# Patient Record
Sex: Female | Born: 1940 | Race: White | Hispanic: No | Marital: Single | Smoking: Former smoker
Health system: Southern US, Community
[De-identification: ages and names within clinical notes are randomized; demographics above are authoritative.]

## PROBLEM LIST (undated history)

## (undated) DIAGNOSIS — K219 Gastro-esophageal reflux disease without esophagitis: Secondary | ICD-10-CM

## (undated) DIAGNOSIS — F32A Depression, unspecified: Secondary | ICD-10-CM

## (undated) DIAGNOSIS — H409 Unspecified glaucoma: Secondary | ICD-10-CM

## (undated) DIAGNOSIS — J4489 Other specified chronic obstructive pulmonary disease: Secondary | ICD-10-CM

## (undated) DIAGNOSIS — R0600 Dyspnea, unspecified: Secondary | ICD-10-CM

## (undated) DIAGNOSIS — J45909 Unspecified asthma, uncomplicated: Secondary | ICD-10-CM

## (undated) DIAGNOSIS — E785 Hyperlipidemia, unspecified: Secondary | ICD-10-CM

## (undated) DIAGNOSIS — J189 Pneumonia, unspecified organism: Secondary | ICD-10-CM

## (undated) DIAGNOSIS — J449 Chronic obstructive pulmonary disease, unspecified: Secondary | ICD-10-CM

## (undated) DIAGNOSIS — R519 Headache, unspecified: Secondary | ICD-10-CM

## (undated) DIAGNOSIS — M1990 Unspecified osteoarthritis, unspecified site: Secondary | ICD-10-CM

## (undated) DIAGNOSIS — T7840XA Allergy, unspecified, initial encounter: Secondary | ICD-10-CM

## (undated) DIAGNOSIS — M8580 Other specified disorders of bone density and structure, unspecified site: Secondary | ICD-10-CM

## (undated) DIAGNOSIS — Z9109 Other allergy status, other than to drugs and biological substances: Secondary | ICD-10-CM

## (undated) HISTORY — PX: VAGINAL HYSTERECTOMY: SUR661

## (undated) HISTORY — PX: AMPUTATION TOE: SHX6595

## (undated) HISTORY — DX: Allergy, unspecified, initial encounter: T78.40XA

## (undated) HISTORY — PX: TONSILLECTOMY: SUR1361

## (undated) HISTORY — PX: ROTATOR CUFF REPAIR: SHX139

## (undated) HISTORY — DX: Other allergy status, other than to drugs and biological substances: Z91.09

## (undated) HISTORY — PX: APPENDECTOMY: SHX54

## (undated) HISTORY — PX: HERNIA REPAIR: SHX51

---

## 2007-07-09 ENCOUNTER — Ambulatory Visit: Payer: Self-pay

## 2007-07-30 ENCOUNTER — Other Ambulatory Visit: Payer: Self-pay

## 2007-07-30 ENCOUNTER — Ambulatory Visit: Payer: Self-pay | Admitting: Orthopaedic Surgery

## 2007-08-12 ENCOUNTER — Ambulatory Visit: Payer: Self-pay | Admitting: Orthopaedic Surgery

## 2010-11-08 ENCOUNTER — Ambulatory Visit: Payer: Self-pay | Admitting: Family Medicine

## 2011-03-28 ENCOUNTER — Ambulatory Visit: Payer: Self-pay | Admitting: Otolaryngology

## 2012-04-14 ENCOUNTER — Ambulatory Visit: Payer: Self-pay | Admitting: Family Medicine

## 2012-10-21 ENCOUNTER — Ambulatory Visit: Payer: Self-pay | Admitting: Nurse Practitioner

## 2013-03-18 DIAGNOSIS — M810 Age-related osteoporosis without current pathological fracture: Secondary | ICD-10-CM | POA: Insufficient documentation

## 2013-03-18 DIAGNOSIS — E785 Hyperlipidemia, unspecified: Secondary | ICD-10-CM | POA: Insufficient documentation

## 2013-12-19 ENCOUNTER — Inpatient Hospital Stay: Payer: Self-pay | Admitting: Internal Medicine

## 2013-12-19 LAB — BASIC METABOLIC PANEL
Anion Gap: 8 (ref 7–16)
BUN: 11 mg/dL (ref 7–18)
CO2: 24 mmol/L (ref 21–32)
Calcium, Total: 8.6 mg/dL (ref 8.5–10.1)
Chloride: 107 mmol/L (ref 98–107)
Creatinine: 0.56 mg/dL — ABNORMAL LOW (ref 0.60–1.30)
EGFR (African American): >60
Glucose: 146 mg/dL — ABNORMAL HIGH (ref 65–99)
OSMOLALITY: 280 (ref 275–301)
Potassium: 3.5 mmol/L (ref 3.5–5.1)
SODIUM: 139 mmol/L (ref 136–145)

## 2013-12-19 LAB — CBC
HCT: 40.4 % (ref 35.0–47.0)
HGB: 13.8 g/dL (ref 12.0–16.0)
MCH: 32.3 pg (ref 26.0–34.0)
MCHC: 34.2 g/dL (ref 32.0–36.0)
MCV: 94 fL (ref 80–100)
Platelet: 174 10*3/uL (ref 150–440)
RBC: 4.29 10*6/uL (ref 3.80–5.20)
RDW: 12.3 % (ref 11.5–14.5)
WBC: 5 10*3/uL (ref 3.6–11.0)

## 2013-12-19 LAB — TROPONIN I: Troponin-I: <0.02 ng/mL

## 2013-12-24 LAB — CULTURE, BLOOD (SINGLE)

## 2014-01-28 ENCOUNTER — Ambulatory Visit: Payer: Self-pay | Admitting: Adult Health

## 2014-01-31 ENCOUNTER — Emergency Department: Payer: Self-pay | Admitting: Internal Medicine

## 2014-01-31 LAB — COMPREHENSIVE METABOLIC PANEL
AST: 36 U/L (ref 15–37)
Albumin: 3.4 g/dL (ref 3.4–5.0)
Alkaline Phosphatase: 79 U/L
Anion Gap: 12 (ref 7–16)
BUN: 13 mg/dL (ref 7–18)
Bilirubin,Total: 0.4 mg/dL (ref 0.2–1.0)
CALCIUM: 8.1 mg/dL — AB (ref 8.5–10.1)
Chloride: 105 mmol/L (ref 98–107)
Co2: 23 mmol/L (ref 21–32)
Creatinine: 0.88 mg/dL (ref 0.60–1.30)
EGFR (African American): >60
Glucose: 149 mg/dL — ABNORMAL HIGH (ref 65–99)
Osmolality: 282 (ref 275–301)
POTASSIUM: 3.6 mmol/L (ref 3.5–5.1)
SGPT (ALT): 33 U/L
Sodium: 140 mmol/L (ref 136–145)
Total Protein: 7.1 g/dL (ref 6.4–8.2)

## 2014-01-31 LAB — PRO B NATRIURETIC PEPTIDE: B-Type Natriuretic Peptide: 113 pg/mL (ref 0–125)

## 2014-01-31 LAB — CBC
HCT: 41.4 % (ref 35.0–47.0)
HGB: 13.6 g/dL (ref 12.0–16.0)
MCH: 31.7 pg (ref 26.0–34.0)
MCHC: 32.8 g/dL (ref 32.0–36.0)
MCV: 97 fL (ref 80–100)
Platelet: 178 10*3/uL (ref 150–440)
RBC: 4.28 10*6/uL (ref 3.80–5.20)
RDW: 12.3 % (ref 11.5–14.5)
WBC: 4.4 10*3/uL (ref 3.6–11.0)

## 2014-01-31 LAB — TROPONIN I

## 2014-02-23 ENCOUNTER — Institutional Professional Consult (permissible substitution): Payer: Self-pay | Admitting: Internal Medicine

## 2014-02-24 ENCOUNTER — Ambulatory Visit (INDEPENDENT_AMBULATORY_CARE_PROVIDER_SITE_OTHER): Payer: BC Managed Care – PPO | Admitting: Internal Medicine

## 2014-02-24 ENCOUNTER — Encounter: Payer: Self-pay | Admitting: Internal Medicine

## 2014-02-24 ENCOUNTER — Institutional Professional Consult (permissible substitution): Payer: Self-pay | Admitting: Internal Medicine

## 2014-02-24 VITALS — BP 122/66 | HR 85 | Ht 63.0 in | Wt 140.0 lb

## 2014-02-24 DIAGNOSIS — IMO0001 Reserved for inherently not codable concepts without codable children: Secondary | ICD-10-CM | POA: Insufficient documentation

## 2014-02-24 DIAGNOSIS — R05 Cough: Secondary | ICD-10-CM

## 2014-02-24 DIAGNOSIS — J449 Chronic obstructive pulmonary disease, unspecified: Secondary | ICD-10-CM

## 2014-02-24 DIAGNOSIS — R0989 Other specified symptoms and signs involving the circulatory and respiratory systems: Secondary | ICD-10-CM

## 2014-02-24 DIAGNOSIS — R059 Cough, unspecified: Secondary | ICD-10-CM | POA: Insufficient documentation

## 2014-02-24 DIAGNOSIS — R0602 Shortness of breath: Secondary | ICD-10-CM

## 2014-02-24 DIAGNOSIS — R0609 Other forms of dyspnea: Secondary | ICD-10-CM | POA: Insufficient documentation

## 2014-02-24 MED ORDER — TIOTROPIUM BROMIDE MONOHYDRATE 2.5 MCG/ACT IN AERS: 2.0000 | INHALATION_SPRAY | Freq: Every day | RESPIRATORY_TRACT | Status: DC

## 2014-02-24 MED ORDER — FLUTICASONE-SALMETEROL 500-50 MCG/DOSE IN AEPB: 1.0000 | INHALATION_SPRAY | Freq: Two times a day (BID) | RESPIRATORY_TRACT | Status: DC

## 2014-02-24 NOTE — Patient Instructions (Addendum)
Stop Atrovent, replace with Spiriva Respimat, 2 puffs once daily. Begin Advair 500/50, 1 puff twice daily.  We will schedule a pulmonary function test and a 6 minute walk test.  We will see you back in 1 month.

## 2014-02-24 NOTE — Assessment & Plan Note (Signed)
Multifactorial: COPD, post infectious and deconditioning, Chronic sinusitis - Plan as stated for dyspnea - Continue followup with ENT, continue with nasal steroid, she is considering RAST testing and will discuss with her ENT physician

## 2014-02-24 NOTE — Assessment & Plan Note (Signed)
Multifactorial: Deconditioning, COPD, recent infection, environmental allergies. Plan as stated for shortness of breath. Patient started on Advair, Spiriva, when necessary albuterol. She was educated on these medications and proper usage and indications.

## 2014-02-24 NOTE — Assessment & Plan Note (Signed)
Multifactorial: COPD, Hx of tobacco abuse, deconditioning - PFTs, 6 minute walk test  -Patient most likely has COPD, review of records did not reveal any recent PFT, COPD is most likely a bronchitis-type given her clinical symptoms (morning cough, sputum production, and shortness of breath)  -Review her last chest x-ray in 2015 showed hyperinflated lungs with flattening of the diaphragms findings consistent with COPD no other acute findings - her CAT score is 22, and MMRC is 3.  Plan to start advair\spiriva PRN albuterol, stop atrovent.

## 2014-02-24 NOTE — Progress Notes (Signed)
Date: 02/24/2014  MRN# [id number] Sandy Eaton Mar 07, 1941   Sandy Eaton is a 73 y.o. old female seen in consultation for cough an DOE  CC:  Chief Complaint  Patient presents with  . Advice Only    Referred by Dr. Willeen Cass at [facility]NT for pulm consult.  Pt c/o sometimes prod cough with clear to white mucus, hoarseness, SOB with exertion.  S//S present X2 year.     HPI:  73 year old female past medical history significant for sinusitis, environmental allergies, seen in consultation for worsening cough x2 years and was to shortness of breath for the last 2-3 months. History per the patient and limited records. She was referred by Dr. Willeen Cass from [facility] for ongoing cough and shortness of breath. Per the patient in the left premature is seen in the ED twice for coughing and shortness of breath. The first time was in July of 2015 where she had shortness of breath fever, she was treated with IV antibiotics steroids and inhalers, had a hospital stay of 4 days. In August 2015 she represented to the ED with complaint of dyspnea at that time it was noted that her sats were 85-92% she was treated with steroids and inhalers and Tessalon Perles, she was then discharged from the ED and advised to followup with ENT. He saw Dr. Willeen Cass on 02/05/2014 who optimizing acid reflux therapy, suggested that she may have sinusitis secondary to environmental allergens, he recommended RAST testing and he started her on Astelin, he also did examination of her larynx and nasal passages which were noted to be congested with clear fluid. Today the patient tells me that she had a prior smoking history of one pack per day x25 years quit in 2010, denies any illicit drug use, has pets at home (1 dog, 1 cat, both are usually outside). She states that she has a cough throughout the day most notably in the mornings, his usually productive of milky sputum. Shortness of breath is worse over the last 2 months, it is usually with  exertion, she is able to go to the bathroom in the kitchen, but she cannot climb a flight of stairs without getting severely short of breath. She has been seeing a different urgent care centers multiple times this year for shortness of breath and had been told that she may have COPD. 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 PMHX:   Past Medical History  Diagnosis Date  . Environmental allergies    Surgical Hx:  Past Surgical History  Procedure Laterality Date  . Vaginal hysterectomy      1960's  . Rotator cuff repair Right    Family Hx:  Family History  Problem Relation Age of Onset  . COPD Father   . Asthma Daughter   . Asthma Grandchild   . Cancer Daughter     breast   Social Hx:   History  Substance Use Topics  . Smoking status: Former Smoker -- 1.00 packs/day for 30 years    Types: Cigarettes    Quit date: 02/24/2009  . Smokeless tobacco: Never Used  . Alcohol Use: No   Medication:   Current Outpatient Rx  Name  Route  Sig  Dispense  Refill  . albuterol (PROVENTIL HFA;VENTOLIN HFA) 108 (90 BASE) MCG/ACT inhaler   Inhalation   Inhale 2 puffs into the lungs every 6 (six) hours as needed for wheezing or shortness of  breath.         Marland Kitchen aspirin 81 MG tablet   Oral   Take 81 mg by mouth daily.         Marland Kitchen azelastine (ASTELIN) 0.1 % nasal spray   Each Nare   Place 1 spray into both nostrils daily. Use in each nostril as directed         . ipratropium (ATROVENT HFA) 17 MCG/ACT inhaler   Inhalation   Inhale 2 puffs into the lungs 2 (two) times daily as needed for wheezing.         . montelukast (SINGULAIR) 10 MG tablet   Oral   Take 10 mg by mouth at bedtime.         Marland Kitchen omeprazole (PRILOSEC) 20 MG capsule   Oral   Take 40 mg by mouth daily.             Allergies:  Valium  Review of Systems: Gen:  Denies  fever, sweats, chills HEENT: Denies blurred vision, double vision, ear pain, eye  pain, hearing loss, nose bleeds, sore throat, hoarseness Cvc:  No dizziness, chest pain or heaviness Resp:   Denies cough or sputum porduction, shortness of breath Gi: Denies swallowing difficulty, stomach pain, nausea or vomiting, diarrhea, constipation, bowel incontinence Gu:  Denies bladder incontinence, burning urine Ext:   No Joint pain, stiffness or swelling Skin: No skin rash, easy bruising or bleeding or hives Endoc:  No polyuria, polydipsia , polyphagia or weight change Psych: No depression, insomnia or hallucinations  Other:  All other systems negative  Physical Examination:   VS: BP 122/66  Pulse 85  Ht  (1.6 m)  Wt 140 lb (63.504 kg)  BMI 24.81 kg/m2  SpO2 96%  General Appearance: No distress  Neuro:without focal findings, mental status, speech normal, alert and oriented, cranial nerves 2-12 intact, reflexes normal and symmetric, sensation grossly normal  HEENT: PERRLA, EOM intact, no ptosis, no other lesions noticed; Mallampati 2 Pulmonary: Good respiratory effort both inspiration and expiration, no wheezes, decreased breath sounds at the bilateral bases. Sputum production: None CardiovascularNormal S1,S2.  No m/r/g.  Abdominal aorta pulsation normal.    Abdomen: Benign, Soft, non-tender, No masses, hepatosplenomegaly, No lymphadenopathy Renal:  No costovertebral tenderness  GU:  No performed at this time. Endoc: No evident thyromegaly, no signs of acromegaly or Cushing features Skin:   warm, no rashes, no ecchymosis  Extremities: normal, no cyanosis, clubbing, no edema, warm with normal capillary refill. Other findings: None   Rad results:  01/31/2014 CXR FINDINGS: Heart, mediastinum hila are unremarkable.  Lungs are mildly hyperexpanded but clear. No pleural effusion or pneumothorax.  Bony thorax is demineralized but grossly intact.  IMPRESSION: No acute cardiopulmonary disease. Other:   Assessment and Plan: SOB (shortness of breath) Multifactorial:  COPD, Hx of tobacco abuse, deconditioning - PFTs, 6 minute walk test  -Patient most likely has COPD, review of records did not reveal any recent PFT, COPD is most likely a bronchitis-type given her clinical symptoms (morning cough, sputum production, and shortness of breath)  -Review her last chest x-ray in 2015 showed hyperinflated lungs with flattening of the diaphragms findings consistent with COPD no other acute findings - her CAT score is 22, and MMRC is 3.  Plan to start advair\spiriva PRN albuterol, stop atrovent.    Cough Multifactorial: COPD, post infectious and deconditioning, Chronic sinusitis - Plan as stated for dyspnea - Continue followup with ENT, continue with nasal steroid, she is considering RAST testing  and will discuss with her ENT physician   DOE (dyspnea on exertion) Multifactorial: Deconditioning, COPD, recent infection, environmental allergies. Plan as stated for shortness of breath. Patient started on Advair, Spiriva, when necessary albuterol. She was educated on these medications and proper usage and indications.  COPD bronchitis Patient currently has clinical criteria for COPD (prolonged smoking history, chronic cough, productive sputum production, shortness of breath)  At this time unable to stage her level of airflow limitation per GOLD guidelines (stage I-4), will further evaluate with spirometry via PFTs.  At this time will Spiriva\adviar (500\50). Given her clinical symptoms and history base of the new GOLD guidelines for COPD she is probably either a category B or D. Patient educated on the proper use of albuterol, Advair, Spiriva, and its indications  Inquired about Pneumovax and flu vaccine, patient stated she would get it done at [facility].   Discuss lung cancer screening at one month followup Patient does not have a regular PMD, she is currently followed a different urgent care centers, will make referral to see either Dr. Dan Humphreys or Dr. Darrick Huntsman.  Updated  Medication List Outpatient Encounter Prescriptions as of 02/24/2014  Medication Sig  . albuterol (PROVENTIL HFA;VENTOLIN HFA) 108 (90 BASE) MCG/ACT inhaler Inhale 2 puffs into the lungs every 6 (six) hours as needed for wheezing or shortness of breath.  Marland Kitchen aspirin 81 MG tablet Take 81 mg by mouth daily.  Marland Kitchen azelastine (ASTELIN) 0.1 % nasal spray Place 1 spray into both nostrils daily. Use in each nostril as directed  . Fluticasone-Salmeterol (ADVAIR DISKUS) 500-50 MCG/DOSE AEPB Inhale 1 puff into the lungs 2 (two) times daily.  Marland Kitchen ipratropium (ATROVENT HFA) 17 MCG/ACT inhaler Inhale 2 puffs into the lungs 2 (two) times daily as needed for wheezing.  . montelukast (SINGULAIR) 10 MG tablet Take 10 mg by mouth at bedtime.  Marland Kitchen omeprazole (PRILOSEC) 20 MG capsule Take 40 mg by mouth daily.  . Tiotropium Bromide Monohydrate (SPIRIVA RESPIMAT) 2.5 MCG/ACT AERS Inhale 2 puffs into the lungs daily.    Orders for this visit: Orders Placed This Encounter  Procedures  . Ambulatory referral to [facility]    Referral Priority:  Routine    Referral Type:  Consultation    Referral Reason:  Specialty Services Required    Requested Specialty:  Family Medicine    Number of Visits Requested:  1  . Pulmonary function test    Standing Status: Future     Number of Occurrences:      Standing Expiration Date: 02/25/2015    Order Specific Question:  Where should this test be performed?    Answer:  Other    Order Specific Question:  Full PFT: includes the following: basic spirometry, spirometry pre & post bronchodilator, diffusion capacity (DLCO), lung volumes    Answer:  Full PFT    Order Specific Question:  MIP/MEP    Answer:  No    Order Specific Question:  6 minute walk    Answer:  Yes    Order Specific Question:  ABG    Answer:  No    Order Specific Question:  Diffusion capacity (DLCO)    Answer:  No    Order Specific Question:  Lung volumes    Answer:  No    Order Specific Question:  Methacholine  challenge    Answer:  No     Thank  you for the consultation and for allowing [facility] and to assist in the care of your  patient. Our recommendations are noted above.  Please contact us if we can be of further service.   Stephanie Acre, MD [facility] Office Number: [phone]

## 2014-02-24 NOTE — Assessment & Plan Note (Addendum)
Patient currently has clinical criteria for COPD (prolonged smoking history, chronic cough, productive sputum production, shortness of breath)  At this time unable to stage her level of airflow limitation per GOLD guidelines (stage I-4), will further evaluate with spirometry via PFTs.  At this time will Spiriva\adviar (500\50). Given her clinical symptoms and history base of the new GOLD guidelines for COPD she is probably either a category B or D. Patient educated on the proper use of albuterol, Advair, Spiriva, and its indications  Inquired about Pneumovax and flu vaccine, patient stated she would get it done at [facility].

## 2014-02-25 ENCOUNTER — Telehealth: Payer: Self-pay | Admitting: Internal Medicine

## 2014-02-25 NOTE — Telephone Encounter (Signed)
Called pt. Line rang several times. WCB 

## 2014-02-26 ENCOUNTER — Telehealth: Payer: Self-pay | Admitting: Internal Medicine

## 2014-02-26 NOTE — Telephone Encounter (Signed)
Called and spoke with pt and she stated that the advair is $489 per month and spiriva is $369 per month and she cannot afford this.  Pt is going to get her formulary and she will call back on Monday to let us know what is covered by her insurance.  Will forward to [place].

## 2014-02-26 NOTE — Telephone Encounter (Signed)
Duplicate message.  See message from 02/25/14

## 2014-02-26 NOTE — Telephone Encounter (Signed)
The patient was given prescriptions for Advair 500-50 and Spiriva she can can't afford these two medications. The Advair is 489.00 and the spririva is 369.00. Please help.

## 2014-03-02 NOTE — Telephone Encounter (Signed)
The patient called her insurance company.They informed her that if someone from the office would call them they would tell them what medications were available for the patient.

## 2014-03-02 NOTE — Telephone Encounter (Signed)
lmtcb x1 for pt. Also need to make her aware to call [place] office when calling

## 2014-03-03 NOTE — Telephone Encounter (Signed)
LMTCB

## 2014-03-04 NOTE — Telephone Encounter (Signed)
LMTCB

## 2014-03-04 NOTE — Telephone Encounter (Signed)
Called spoke with patient who verified that her insurance informed her have our office call for drug coverage:  [employer] is her primary insurance @ [id number], ID # [id number] W. She is aware we will call with VM's recs.  Spoke with Lanora Manis w/ [employer].  Covered alternatives are Dulera (roughly $77.72/30days and $176.01/90days) and Symbicort (roughly $83.44/30days and $188.53/90days).  Dr Dema Severin please advise if pt may be switched to one of these covered alternatives, thank you.

## 2014-03-04 NOTE — Telephone Encounter (Signed)
Pt returning call.Sandy Eaton ° °

## 2014-03-05 NOTE — Telephone Encounter (Signed)
Phone note was signed by VM with note: ----- Message from Stephanie Acre, MD sent at 03/04/2014 11:17 PM ----- We can trying switching her to Symbicort  LMOM TCB x1 for pt.

## 2014-03-08 MED ORDER — BUDESONIDE-FORMOTEROL FUMARATE 160-4.5 MCG/ACT IN AERO: 2.0000 | INHALATION_SPRAY | Freq: Two times a day (BID) | RESPIRATORY_TRACT | Status: DC

## 2014-03-08 NOTE — Addendum Note (Signed)
Addended by: Christen Butter on: 03/08/2014 09:43 AM   Modules accepted: Orders

## 2014-03-08 NOTE — Telephone Encounter (Signed)
Per las PN- we can try symbicort  I have spoke with the pt and notified of this and she verbalized understanding  Rx for symbicort was sent to pharm

## 2014-03-09 ENCOUNTER — Ambulatory Visit: Payer: Self-pay | Admitting: Internal Medicine

## 2014-03-11 ENCOUNTER — Telehealth: Payer: Self-pay | Admitting: Internal Medicine

## 2014-03-11 MED ORDER — ALBUTEROL SULFATE HFA 108 (90 BASE) MCG/ACT IN AERS: 2.0000 | INHALATION_SPRAY | Freq: Four times a day (QID) | RESPIRATORY_TRACT | Status: DC | PRN

## 2014-03-11 NOTE — Telephone Encounter (Signed)
RX has been called in. Nothing further needed 

## 2014-03-12 ENCOUNTER — Telehealth: Payer: Self-pay | Admitting: Internal Medicine

## 2014-03-12 NOTE — Telephone Encounter (Signed)
Called made pt aware we do not have any samples. Nothing further needed

## 2014-03-22 ENCOUNTER — Telehealth: Payer: Self-pay | Admitting: Internal Medicine

## 2014-03-22 NOTE — Telephone Encounter (Signed)
lmomtcb x1 

## 2014-03-23 NOTE — Telephone Encounter (Signed)
Called and spoke to pt. Pt inquiring if there was a closer PCP to where she lives. The closer office is not taking any new pts, which is why pt was referred to the office in [place] [facility] [place]. Pt stated she will just have a family member drive her because she does not like to drive anywhere but within [place]. Pt cancelled appt with PCP but stated she will call back and get new appt. Pt stated she will call here if any other issues arise. Pt denied any further needs at this time. Nothing further needed.

## 2014-03-30 ENCOUNTER — Ambulatory Visit: Payer: Medicare Other | Admitting: Internal Medicine

## 2014-10-09 NOTE — Discharge Summary (Signed)
PATIENT NAME:  Sandy Eaton, Sandy Eaton MR#:  [id number] DATE OF BIRTH:  Oct 06, 1940  DATE OF ADMISSION:  12/19/2013 DATE OF DISCHARGE:  12/22/2013  ADMITTING PHYSICIAN: Dr. Enedina FinnerSona Eaton.   DISCHARGING PHYSICIAN: Sandy Eaton, M.D.  PRIMARY CARE PHYSICIAN: None.   CONSULTATIONS IN THE HOSPITAL: None.   DISCHARGE DIAGNOSES:  1.  Right lower lobe pneumonia.  2.  Gastroesophageal reflux disease.  3.  Hyperlipidemia.  4.  Osteoporosis.  5.  History of seasonal allergies.   DISCHARGE MEDICATIONS:  1.  Atorvastatin 10 mg p.o. daily.  2.  Aspirin 81 mg p.o. daily.  3.  Omeprazole 20 mg p.o. daily.  4.  Flonase nasal spray 2 sprays each nostril once a day.  5.  Ventolin inhaler 2 puffs 4 times a day as needed for shortness of breath or wheezing.  6.  Fosamax 70 mg p.o. once a week on Saturday.  7.  Levaquin 500 mg p.o. daily for 6 days.  8.  Tessalon Perles 100 mg p.o. q. 6 hours p.r.n. for cough.   DISCHARGE DIET: Regular diet.   DISCHARGE OXYGEN: None.   DISCHARGE ACTIVITY: As tolerated.   FOLLOW-UP INSTRUCTIONS:  Follow up with PCD if symptoms do not improve.  LABORATORY, DIAGNOSTIC AND RADIOLOGICAL DATA PRIOR TO DISCHARGE: Blood cultures remain negative.   WBC is 5.0, hemoglobin 13.8, hematocrit 40.4, platelet count 174,000.   Sodium 139, potassium 3.5, chloride 107, bicarbonate 24, BUN 11, creatinine 0.56, glucose 146 and calcium of 8.6.   Chest x-ray on 12/19/2013, showing hazy right lower lobe airspace disease concerning for pneumonia.  July 7 chest x-ray showing near complete resolution of right middle lobe airspace opacity.   BRIEF HOSPITAL COURSE: Ms. Sandy Eaton is a 74 year old Caucasian female with past medical history significant for gastroesophageal reflux disease, hyperlipidemia and osteoporosis, who presents from home secondary to worsening shortness of breath and productive phlegm despite taking minocycline that was prescribed as an outpatient. The patient was noted to have  right lower lobe pneumonia failing outpatient treatment.   1.  Right lower lobe pneumonia, significant consolidation on chest x-ray on admission. Started on IV antibiotics and fluids. Blood cultures remained negative.  Cough improved with Tessalon Perles and she will be discharged home on Levaquin.   2.  Likely underlying COPD from being a smoker. No wheezing. Continue albuterol inhaler as needed. Will need to have pulmonary function tests for official diagnosis.   3.  Gastroesophageal reflux disease on Prilosec at home.  4.  Osteoporosis on Fosamax at home.   Her course has been, otherwise, uneventful in the hospital.   DISCHARGE CONDITION: Stable.   DISCHARGE DISPOSITION: Home.        TIME SPENT ON DISCHARGE: 45 minutes.   ____________________________ Sandy Baasadhika Matas Burrows, MD rk:ts D: 12/22/2013 15:39:28 ET T: 12/22/2013 18:12:44 ET JOB#: [id number]  cc: Sandy Baasadhika Rhylen Shaheen, MD, <Dictator> Sandy BaasADHIKA Koya Hunger MD ELECTRONICALLY SIGNED 12/24/2013 10:43

## 2014-10-09 NOTE — H&P (Signed)
PATIENT NAME:  Sandy Eaton, Sandy Eaton MR#:  [id number] DATE OF BIRTH:  10-10-1940  DATE OF ADMISSION:  12/19/2013  PRIMARY CARE PHYSICIAN: [facility].  CHIEF COMPLAINT: Productive cough with weakness.   HISTORY OF PRESENT ILLNESS: Ms. Sandy Eaton is a pleasant 74 year old Caucasian female with medical history of GERD and history of hyperlipidemia along with seasonal allergies, who comes to the Emergency Room after she started having increasing shortness of breath and productive phlegm despite taking antibiotics. She was prescribed minocycline for her cough as outpatient by her primary care physician's office. She continued to decline and was found to have bilateral pneumonia in the Emergency Room. She failed outpatient treatment; hence, has received a dose of IV Levaquin. Will admit her for further workup of her pneumonia.   PAST MEDICAL HISTORY:  1. Seasonal allergies.  2. Ex-smoker.  3. GERD.  4. Hyperlipidemia.  5. Osteoporosis.   ALLERGIES: NARCOTICS CAUSE HALLUCINATIONS, AND AUGMENTIN AND CODEINE.   MEDICATIONS: 1. Atorvastatin 10 mg daily. 2. Aspirin 81 mg daily. 3. Alendronate 70 mg once a week. 4. Fluticasone nasal spray 2 sprays into each nostril daily. 5. Omeprazole 20 mg daily. 6. Minocycline 100 mg b.i.d., which was recently started by primary care physician. 7. Ventolin HFA 2 puffs 4 times a day as needed.  SOCIAL HISTORY: The patient lives at home.   REVIEW OF SYSTEMS:  CONSTITUTIONAL: Positive for fatigue and weakness.  EYES: No blurred or double vision, glaucoma or cataract.  ENT: No tinnitus, ear pain, hearing loss or postnasal drip.  RESPIRATORY: Positive for cough, productive phlegm and shortness of breath.  CARDIOVASCULAR: No chest pain, orthopnea. Positive for dyspnea on exertion. No palpitations or hypertension. GASTROINTESTINAL: No nausea, vomiting, diarrhea, abdominal pain. No GERD.  GENITOURINARY: No dysuria, hematuria or frequency.  ENDOCRINE: No polyuria,  nocturia or thyroid problems.   HEMATOLOGY: No anemia, easy bruising or bleeding.  SKIN: No acne, rash or lesions.  MUSCULOSKELETAL: Positive for arthritis.  NEUROLOGIC: No CVA, TIA, ataxia or dementia.  PSYCHIATRIC: No anxiety or depression.  All other systems reviewed are negative.   PHYSICAL EXAMINATION:  GENERAL: The patient is awake, alert and oriented x3, not in acute distress.  VITAL SIGNS: Afebrile, pulse is 99, blood pressure is 114/79, pulse oximetry is 92% on 2 liters.  HEENT: Atraumatic, normocephalic. Pupils equal, round and reactive to light and accommodation. EOM intact. Oral mucosa is moist.  NECK: Supple. No JVD. No carotid bruit.  RESPIRATORY: The patient has bibasilar crackles. Coarse breath sounds. No respiratory distress or labored breathing.  CARDIOVASCULAR: Both the heart sounds are normal. Rate and rhythm regular. PMI not lateralized. Chest nontender. Good pedal pulses, good femoral pulses. No lower extremity edema.  ABDOMEN: Soft, benign, nontender. No organomegaly. Positive bowel sounds.  NEUROLOGIC: Grossly intact cranial nerves II through XII. No motor or sensory deficit.  PSYCHIATRIC: The patient is awake, alert, oriented x3.   DIAGNOSTIC DATA:  EKG shows normal sinus rhythm. No acute ST elevation or depression. Chest x-ray shows hazy right lower airspace disease concerning for pneumonia.  CBC within normal limits.  Cardiac enzymes negative. Basic metabolic panel within normal limits except glucose of 146.  ASSESSMENT: The 74 year old Sandy Eaton, with history of seasonal allergies and gastroesophageal reflux disease, comes in with increasing shortness of breath and productive phlegm. She is being admitted with:   1. Right lower lobe pneumonia. The patient failed outpatient treatment. Will admit her to medical floor. Start her on IV Levaquin. Continue IV fluids for hydration. Continue  her inhalers and follow blood cultures and sputum cultures.  2.  Gastroesophageal reflux disease. Continue PPIs. 3. History of smoking in the past with possible underlying chronic obstructive pulmonary disease, although the patient does not have a formal diagnosis. Will continue inhalers and DuoNebs as needed. 4. Hyperlipidemia, on statins. 5. Deep vein thrombosis prophylaxis. Subcutaneous heparin. 6. Further workup according to the patient's clinical course.    Hospital admission plan was discussed with the patient and the patient's daughter who was present in the emergency room.   TIME SPENT: 50 minutes.  ____________________________ Wylie Hail Allena Katz, MD sap:lb D: 12/19/2013 12:10:55 ET T: 12/19/2013 12:15:15 ET JOB#: [id number]  cc: Santosh Petter A. Allena Katz, MD, <Dictator> [facility] Willow Ora MD ELECTRONICALLY SIGNED 12/21/2013 10:42

## 2014-11-26 ENCOUNTER — Emergency Department: Payer: BLUE CROSS/BLUE SHIELD

## 2014-11-26 ENCOUNTER — Inpatient Hospital Stay
Admission: EM | Admit: 2014-11-26 | Discharge: 2014-11-29 | DRG: 194 | Disposition: A | Discharge status: Home / Self Care | Payer: BLUE CROSS/BLUE SHIELD | Attending: Internal Medicine | Admitting: Internal Medicine

## 2014-11-26 ENCOUNTER — Encounter: Payer: Self-pay | Admitting: Emergency Medicine

## 2014-11-26 ENCOUNTER — Emergency Department (HOSPITAL_COMMUNITY): Payer: Medicare Other

## 2014-11-26 DIAGNOSIS — K219 Gastro-esophageal reflux disease without esophagitis: Secondary | ICD-10-CM | POA: Diagnosis present

## 2014-11-26 DIAGNOSIS — J189 Pneumonia, unspecified organism: Secondary | ICD-10-CM | POA: Diagnosis present

## 2014-11-26 DIAGNOSIS — J181 Lobar pneumonia, unspecified organism: Secondary | ICD-10-CM | POA: Diagnosis present

## 2014-11-26 DIAGNOSIS — Z7951 Long term (current) use of inhaled steroids: Secondary | ICD-10-CM

## 2014-11-26 DIAGNOSIS — J440 Chronic obstructive pulmonary disease with acute lower respiratory infection: Secondary | ICD-10-CM | POA: Diagnosis present

## 2014-11-26 DIAGNOSIS — Z7982 Long term (current) use of aspirin: Secondary | ICD-10-CM

## 2014-11-26 DIAGNOSIS — J45909 Unspecified asthma, uncomplicated: Secondary | ICD-10-CM | POA: Diagnosis present

## 2014-11-26 DIAGNOSIS — Z87891 Personal history of nicotine dependence: Secondary | ICD-10-CM | POA: Diagnosis not present

## 2014-11-26 DIAGNOSIS — E785 Hyperlipidemia, unspecified: Secondary | ICD-10-CM | POA: Diagnosis present

## 2014-11-26 DIAGNOSIS — Z825 Family history of asthma and other chronic lower respiratory diseases: Secondary | ICD-10-CM | POA: Diagnosis not present

## 2014-11-26 DIAGNOSIS — J441 Chronic obstructive pulmonary disease with (acute) exacerbation: Secondary | ICD-10-CM | POA: Diagnosis present

## 2014-11-26 DIAGNOSIS — R0902 Hypoxemia: Secondary | ICD-10-CM | POA: Diagnosis present

## 2014-11-26 DIAGNOSIS — Y95 Nosocomial condition: Secondary | ICD-10-CM

## 2014-11-26 HISTORY — DX: Chronic obstructive pulmonary disease, unspecified: J44.9

## 2014-11-26 HISTORY — DX: Hyperlipidemia, unspecified: E78.5

## 2014-11-26 HISTORY — DX: Unspecified asthma, uncomplicated: J45.909

## 2014-11-26 HISTORY — DX: Gastro-esophageal reflux disease without esophagitis: K21.9

## 2014-11-26 LAB — CBC WITH DIFFERENTIAL/PLATELET
BASOS ABS: 0 10*3/uL (ref 0–0.1)
BASOS PCT: 0 %
Eosinophils Absolute: 0 10*3/uL (ref 0–0.7)
Eosinophils Relative: 0 %
HCT: 38.3 % (ref 35.0–47.0)
Hemoglobin: 12.9 g/dL (ref 12.0–16.0)
Lymphocytes Relative: 9 %
Lymphs Abs: 1.2 10*3/uL (ref 1.0–3.6)
MCH: 31.6 pg (ref 26.0–34.0)
MCHC: 33.7 g/dL (ref 32.0–36.0)
MCV: 93.7 fL (ref 80.0–100.0)
MONO ABS: 1 10*3/uL — AB (ref 0.2–0.9)
Monocytes Relative: 8 %
NEUTROS ABS: 11.2 10*3/uL — AB (ref 1.4–6.5)
Neutrophils Relative %: 83 %
Platelets: 159 10*3/uL (ref 150–440)
RBC: 4.09 MIL/uL (ref 3.80–5.20)
RDW: 13.1 % (ref 11.5–14.5)
WBC: 13.5 10*3/uL — AB (ref 3.6–11.0)

## 2014-11-26 LAB — COMPREHENSIVE METABOLIC PANEL
ALK PHOS: 96 U/L (ref 38–126)
ALT: 16 U/L (ref 14–54)
AST: 15 U/L (ref 15–41)
Albumin: 3.1 g/dL — ABNORMAL LOW (ref 3.5–5.0)
Anion gap: 10 (ref 5–15)
BILIRUBIN TOTAL: 0.7 mg/dL (ref 0.3–1.2)
BUN: 16 mg/dL (ref 6–20)
CALCIUM: 8.1 mg/dL — AB (ref 8.9–10.3)
CO2: 23 mmol/L (ref 22–32)
CREATININE: 0.5 mg/dL (ref 0.44–1.00)
Chloride: 100 mmol/L — ABNORMAL LOW (ref 101–111)
GFR calc Af Amer: >60 mL/min (ref >60)
GFR calc non Af Amer: >60 mL/min (ref >60)
GLUCOSE: 176 mg/dL — AB (ref 65–99)
Potassium: 3.6 mmol/L (ref 3.5–5.1)
Sodium: 133 mmol/L — ABNORMAL LOW (ref 135–145)
Total Protein: 6.9 g/dL (ref 6.5–8.1)

## 2014-11-26 LAB — URINALYSIS COMPLETE WITH MICROSCOPIC (ARMC ONLY)
Bilirubin Urine: NEGATIVE
GLUCOSE, UA: 150 mg/dL — AB
Hgb urine dipstick: NEGATIVE
Leukocytes, UA: NEGATIVE
NITRITE: NEGATIVE
Protein, ur: 100 mg/dL — AB
SPECIFIC GRAVITY, URINE: 1.029 (ref 1.005–1.030)
pH: 5 (ref 5.0–8.0)

## 2014-11-26 LAB — LIPASE, BLOOD: Lipase: 45 U/L (ref 22–51)

## 2014-11-26 LAB — TROPONIN I

## 2014-11-26 MED ORDER — IOHEXOL 240 MG/ML SOLN
25.0000 mL | Freq: Once | INTRAMUSCULAR | Status: AC | PRN
  Administered 2014-11-26: 25 mL via ORAL

## 2014-11-26 MED ORDER — ONDANSETRON HCL 4 MG/2ML IJ SOLN
INTRAMUSCULAR | Status: AC
  Administered 2014-11-26: 4 mg via INTRAVENOUS
  Filled 2014-11-26: qty 2

## 2014-11-26 MED ORDER — PANTOPRAZOLE SODIUM 40 MG PO TBEC
40.0000 mg | DELAYED_RELEASE_TABLET | Freq: Every day | ORAL | Status: DC
  Administered 2014-11-27 – 2014-11-28 (×2): 40 mg via ORAL
  Filled 2014-11-26 (×2): qty 1

## 2014-11-26 MED ORDER — IPRATROPIUM-ALBUTEROL 0.5-2.5 (3) MG/3ML IN SOLN
3.0000 mL | Freq: Once | RESPIRATORY_TRACT | Status: AC
  Administered 2014-11-26: 3 mL via RESPIRATORY_TRACT

## 2014-11-26 MED ORDER — ALBUTEROL SULFATE (2.5 MG/3ML) 0.083% IN NEBU
2.5000 mg | INHALATION_SOLUTION | Freq: Four times a day (QID) | RESPIRATORY_TRACT | Status: DC | PRN
Start: 2014-11-26 — End: 2014-11-29

## 2014-11-26 MED ORDER — PANTOPRAZOLE SODIUM 40 MG PO TBEC: 40.0000 mg | DELAYED_RELEASE_TABLET | Freq: Every day | ORAL | Status: DC

## 2014-11-26 MED ORDER — AZELASTINE HCL 0.1 % NA SOLN
1.0000 | Freq: Every day | NASAL | Status: DC
  Administered 2014-11-26 – 2014-11-29 (×4): 1 via NASAL
  Filled 2014-11-26: qty 30

## 2014-11-26 MED ORDER — POTASSIUM CHLORIDE IN NACL 20-0.9 MEQ/L-% IV SOLN
INTRAVENOUS | Status: AC
  Administered 2014-11-27: 05:00:00 via INTRAVENOUS
  Filled 2014-11-26 (×2): qty 1000

## 2014-11-26 MED ORDER — MAGNESIUM HYDROXIDE 400 MG/5ML PO SUSP: 30.0000 mL | Freq: Every day | ORAL | Status: DC | PRN

## 2014-11-26 MED ORDER — PREDNISONE 20 MG PO TABS
40.0000 mg | ORAL_TABLET | Freq: Once | ORAL | Status: AC
  Administered 2014-11-26: 40 mg via ORAL
  Filled 2014-11-26: qty 2

## 2014-11-26 MED ORDER — ONDANSETRON HCL 4 MG/2ML IJ SOLN
4.0000 mg | Freq: Once | INTRAMUSCULAR | Status: AC
  Administered 2014-11-26: 4 mg via INTRAVENOUS

## 2014-11-26 MED ORDER — IPRATROPIUM-ALBUTEROL 0.5-2.5 (3) MG/3ML IN SOLN: 3.0000 mL | Freq: Once | RESPIRATORY_TRACT | Status: DC

## 2014-11-26 MED ORDER — BISACODYL 5 MG PO TBEC
5.0000 mg | DELAYED_RELEASE_TABLET | Freq: Every day | ORAL | Status: DC | PRN
  Administered 2014-11-28: 5 mg via ORAL
  Filled 2014-11-26: qty 1

## 2014-11-26 MED ORDER — ASPIRIN EC 81 MG PO TBEC
81.0000 mg | DELAYED_RELEASE_TABLET | Freq: Every day | ORAL | Status: DC
Start: 2014-11-26 — End: 2014-11-29
  Administered 2014-11-27 – 2014-11-29 (×3): 81 mg via ORAL
  Filled 2014-11-26 (×3): qty 1

## 2014-11-26 MED ORDER — ACETAMINOPHEN 650 MG RE SUPP: 650.0000 mg | Freq: Four times a day (QID) | RECTAL | Status: DC | PRN

## 2014-11-26 MED ORDER — SODIUM CHLORIDE 0.9 % IV SOLN
Freq: Once | INTRAVENOUS | Status: AC
  Administered 2014-11-26: 11:00:00 via INTRAVENOUS

## 2014-11-26 MED ORDER — IPRATROPIUM-ALBUTEROL 0.5-2.5 (3) MG/3ML IN SOLN
RESPIRATORY_TRACT | Status: AC
  Administered 2014-11-26: 3 mL via RESPIRATORY_TRACT
  Filled 2014-11-26: qty 3

## 2014-11-26 MED ORDER — ALBUTEROL SULFATE HFA 108 (90 BASE) MCG/ACT IN AERS: 2.0000 | INHALATION_SPRAY | Freq: Four times a day (QID) | RESPIRATORY_TRACT | Status: DC | PRN

## 2014-11-26 MED ORDER — MORPHINE SULFATE 2 MG/ML IJ SOLN
INTRAMUSCULAR | Status: AC
  Filled 2014-11-26: qty 1

## 2014-11-26 MED ORDER — ONDANSETRON HCL 4 MG/2ML IJ SOLN
INTRAMUSCULAR | Status: AC
  Administered 2014-11-26: 19:00:00 4 mg via INTRAVENOUS
  Filled 2014-11-26: qty 2

## 2014-11-26 MED ORDER — PROCHLORPERAZINE EDISYLATE 5 MG/ML IJ SOLN
5.0000 mg | Freq: Once | INTRAMUSCULAR | Status: AC
  Administered 2014-11-26: 23:00:00 10 mg via INTRAVENOUS
  Filled 2014-11-26: qty 2

## 2014-11-26 MED ORDER — IOHEXOL 300 MG/ML  SOLN
100.0000 mL | Freq: Once | INTRAMUSCULAR | Status: AC | PRN
  Administered 2014-11-26: 100 mL via INTRAVENOUS

## 2014-11-26 MED ORDER — MOMETASONE FURO-FORMOTEROL FUM 200-5 MCG/ACT IN AERO: 2.0000 | INHALATION_SPRAY | Freq: Two times a day (BID) | RESPIRATORY_TRACT | Status: DC

## 2014-11-26 MED ORDER — BUDESONIDE-FORMOTEROL FUMARATE 160-4.5 MCG/ACT IN AERO
2.0000 | INHALATION_SPRAY | Freq: Two times a day (BID) | RESPIRATORY_TRACT | Status: DC
  Administered 2014-11-26 – 2014-11-29 (×6): 2 via RESPIRATORY_TRACT
  Filled 2014-11-26: qty 6

## 2014-11-26 MED ORDER — ONDANSETRON HCL 4 MG/2ML IJ SOLN
4.0000 mg | Freq: Four times a day (QID) | INTRAMUSCULAR | Status: DC
  Administered 2014-11-26 – 2014-11-27 (×6): 4 mg via INTRAVENOUS
  Filled 2014-11-26 (×5): qty 2

## 2014-11-26 MED ORDER — ENOXAPARIN SODIUM 40 MG/0.4ML ~~LOC~~ SOLN
40.0000 mg | SUBCUTANEOUS | Status: DC
  Administered 2014-11-26 – 2014-11-28 (×3): 40 mg via SUBCUTANEOUS
  Filled 2014-11-26 (×3): qty 0.4

## 2014-11-26 MED ORDER — ACETAMINOPHEN 325 MG PO TABS
650.0000 mg | ORAL_TABLET | Freq: Four times a day (QID) | ORAL | Status: DC | PRN
  Administered 2014-11-27 – 2014-11-29 (×6): 650 mg via ORAL
  Filled 2014-11-26 (×6): qty 2

## 2014-11-26 MED ORDER — MONTELUKAST SODIUM 10 MG PO TABS
10.0000 mg | ORAL_TABLET | Freq: Every day | ORAL | Status: DC
  Administered 2014-11-26 – 2014-11-28 (×3): 10 mg via ORAL
  Filled 2014-11-26 (×3): qty 1

## 2014-11-26 MED ORDER — MORPHINE SULFATE 2 MG/ML IJ SOLN
2.0000 mg | INTRAMUSCULAR | Status: AC
  Administered 2014-11-26: 2 mg via INTRAVENOUS

## 2014-11-26 MED ORDER — MORPHINE SULFATE 2 MG/ML IJ SOLN
1.0000 mg | INTRAMUSCULAR | Status: DC | PRN
  Administered 2014-11-26: 2 mg via INTRAVENOUS
  Filled 2014-11-26: qty 1

## 2014-11-26 MED ORDER — IPRATROPIUM-ALBUTEROL 0.5-2.5 (3) MG/3ML IN SOLN
3.0000 mL | RESPIRATORY_TRACT | Status: DC
  Administered 2014-11-26 – 2014-11-28 (×12): 3 mL via RESPIRATORY_TRACT
  Filled 2014-11-26 (×12): qty 3

## 2014-11-26 MED ORDER — PREDNISONE 50 MG PO TABS
50.0000 mg | ORAL_TABLET | Freq: Every day | ORAL | Status: DC
  Administered 2014-11-27 – 2014-11-29 (×3): 50 mg via ORAL
  Filled 2014-11-26 (×3): qty 1

## 2014-11-26 MED ORDER — OXYCODONE-ACETAMINOPHEN 5-325 MG PO TABS
1.0000 | ORAL_TABLET | Freq: Four times a day (QID) | ORAL | Status: DC | PRN
  Administered 2014-11-27 (×3): 2 via ORAL
  Administered 2014-11-28: 1 via ORAL
  Filled 2014-11-26: qty 1
  Filled 2014-11-26 (×4): qty 2

## 2014-11-26 MED ORDER — PIPERACILLIN-TAZOBACTAM 3.375 G IVPB 30 MIN
3.3750 g | Freq: Three times a day (TID) | INTRAVENOUS | Status: DC
  Administered 2014-11-26 – 2014-11-27 (×2): 3.375 g via INTRAVENOUS
  Filled 2014-11-26 (×6): qty 50

## 2014-11-26 MED ORDER — SODIUM CHLORIDE 0.9 % IV BOLUS (SEPSIS): 1000.0000 mL | Freq: Once | INTRAVENOUS | Status: DC

## 2014-11-26 MED ORDER — CEFTRIAXONE SODIUM IN DEXTROSE 40 MG/ML IV SOLN
2.0000 g | Freq: Once | INTRAVENOUS | Status: AC
  Administered 2014-11-26: 2 g via INTRAVENOUS
  Filled 2014-11-26: qty 50

## 2014-11-26 MED ORDER — LEVOFLOXACIN IN D5W 750 MG/150ML IV SOLN
750.0000 mg | INTRAVENOUS | Status: DC
  Administered 2014-11-26 – 2014-11-27 (×2): 750 mg via INTRAVENOUS
  Filled 2014-11-26 (×3): qty 150

## 2014-11-26 NOTE — Plan of Care (Signed)
Problem: Discharge Progression Outcomes Goal: Other Discharge Outcomes/Goals Outcome: Progressing 74 year old female admitted to 1c from ER with bilateral pneumonia Pt lives at home by herself, but family visits frequently Pt is a former smoker, but has quit 20 years ago Monitor vs, temp Rocephin started in the ER, continue with IV abts as tolerated.

## 2014-11-26 NOTE — ED Notes (Signed)
Pt sleeping with family at bedside, daughters.

## 2014-11-26 NOTE — Plan of Care (Signed)
Problem: Consults Goal: Nutrition Consult-if indicated Outcome: Progressing Increasing PO fluids as tolerated, IV fluids started in ER.

## 2014-11-26 NOTE — ED Notes (Signed)
Patient transported to CT 

## 2014-11-26 NOTE — Plan of Care (Signed)
Problem: Discharge Progression Outcomes Goal: O2 sats at patient's baseline Outcome: Progressing Pt placed on 2 liter's of 02 in ER saturation <90% with dyspnea. Pt is on RA at baseline, will wean as tolerated.

## 2014-11-26 NOTE — ED Notes (Signed)
CT notified pt completed contrast.  

## 2014-11-26 NOTE — ED Notes (Signed)
Dr Fanny Bien at bedside. Aware of pt Sp02. 02 applied.

## 2014-11-26 NOTE — Progress Notes (Signed)
Spoke to Dr Sheryle Hail R/T patient complaints of nausea not relieved by scheduled zofran. Order for 5 mg compazine x 1 to see if along with zofran this will help.

## 2014-11-26 NOTE — H&P (Signed)
[facility] - [place] at [facility]   PATIENT NAME: Sandy Eaton    MR#:  [id number]  DATE OF BIRTH:  15-Jan-1941  DATE OF ADMISSION:  11/26/2014  PRIMARY CARE PHYSICIAN: Pcp Not In System   REQUESTING/REFERRING PHYSICIAN: Quale  CHIEF COMPLAINT:   Cough, nausea, abdominal pain and weakness HISTORY OF PRESENT ILLNESS:  Sandy Eaton  is a 74 y.o. female with a known history of COPD, previous smoker, GERD and hyperlipidemia is been feeling sick from Monday. Patient was having nausea vomiting and abdominal pain. Constipated and did not have any bowel movements for the past to 3 days. Patient was seen by her primary care physician as she was having cause cough as well.. Patient was started on azithromycin by her primary care physician  2 days ago with no significant improvement. As her clinical situation is deteriorating family members have brought her into the ED. Patient also has been not eating or drinking for the past 2 days. Her flu test was negative and doctor's office.Chest x-ray has revealed pneumonia. Patient is started on Rocephin. CAT scan of the abdomen did not reveal any acute abnormalities.  PAST MEDICAL HISTORY:   Past Medical History  Diagnosis Date  . Environmental allergies   . COPD (chronic obstructive pulmonary disease)   . GERD (gastroesophageal reflux disease)   . Asthma   . Hyperlipidemia     PAST SURGICAL HISTOIRY:   Past Surgical History  Procedure Laterality Date  . Vaginal hysterectomy      1960's  . Rotator cuff repair Right     SOCIAL HISTORY:   History  Substance Use Topics  . Smoking status: Former Smoker -- 1.00 packs/day for 30 years    Types: Cigarettes    Quit date: 02/24/2009  . Smokeless tobacco: Never Used  . Alcohol Use: No    FAMILY HISTORY:   Family History  Problem Relation Age of Onset  . COPD Father   . Asthma Daughter   . Asthma Grandchild   . Cancer Daughter     breast    DRUG ALLERGIES:    Allergies  Allergen Reactions  . Augmentin [Amoxicillin-Pot Clavulanate] Nausea And Vomiting  . Valium [Diazepam] Other (See Comments)    Reaction:  Hallucinations     REVIEW OF SYSTEMS:  CONSTITUTIONAL: No fever, reporting fatigue and weakness.  EYES: No blurred or double vision.  EARS, NOSE, AND THROAT: No tinnitus or ear pain.  RESPIRATORY: Has cough and some shortness of breath, no wheezing or hemoptysis.  CARDIOVASCULAR: No chest pain, orthopnea, edema.  GASTROINTESTINAL: Has nausea, no vomiting, diarrhea reporting constipation and diffuse abdominal pain.  GENITOURINARY: No dysuria, hematuria.  ENDOCRINE: No polyuria, nocturia,  HEMATOLOGY: No anemia, easy bruising or bleeding SKIN: No rash or lesion. MUSCULOSKELETAL: No joint pain or arthritis.   NEUROLOGIC: No tingling, numbness, weakness.  PSYCHIATRY: No anxiety or depression.   MEDICATIONS AT HOME:   Prior to Admission medications   Medication Sig Start Date End Date Taking? Authorizing Provider  HYDROcodone-acetaminophen (NORCO/VICODIN) 5-325 MG per tablet Take 1 tablet by mouth every 6 (six) hours as needed for moderate pain.   Yes Historical Provider, MD  omeprazole (PRILOSEC) 40 MG capsule Take 40 mg by mouth daily.   Yes Historical Provider, MD  albuterol (PROAIR HFA) 108 (90 BASE) MCG/ACT inhaler Inhale 2 puffs into the lungs every 6 (six) hours as needed for wheezing or shortness of breath. 03/11/14   Stephanie Acre, MD  aspirin 81 MG tablet  Take 81 mg by mouth daily.    Historical Provider, MD  azelastine (ASTELIN) 0.1 % nasal spray Place 1 spray into both nostrils daily. Use in each nostril as directed    Historical Provider, MD  budesonide-formoterol (SYMBICORT) 160-4.5 MCG/ACT inhaler Inhale 2 puffs into the lungs 2 (two) times daily. 03/08/14   Vishal Mungal, MD  Fluticasone-Salmeterol (ADVAIR DISKUS) 500-50 MCG/DOSE AEPB Inhale 1 puff into the lungs 2 (two) times daily. 02/24/14 02/24/15  Vishal Mungal, MD   ipratropium (ATROVENT HFA) 17 MCG/ACT inhaler Inhale 2 puffs into the lungs 2 (two) times daily as needed for wheezing.    Historical Provider, MD  montelukast (SINGULAIR) 10 MG tablet Take 10 mg by mouth at bedtime.    Historical Provider, MD  Tiotropium Bromide Monohydrate (SPIRIVA RESPIMAT) 2.5 MCG/ACT AERS Inhale 2 puffs into the lungs daily. 02/24/14   Vishal Mungal, MD      VITAL SIGNS:  Blood pressure 114/70, pulse 88, temperature 98.2 F (36.8 C), temperature source Oral, resp. rate 16, height 5\' 3"  (1.6 m), weight 63.504 kg (140 lb), SpO2 92 %.  PHYSICAL EXAMINATION:  GENERAL:  74 y.o.-year-old patient lying in the bed with no acute distress. Sick looking EYES: Pupils equal, round, reactive to light and accommodation. No scleral icterus.  HEENT: Head atraumatic, normocephalic. Oropharynx and nasopharynx clear.  NECK:  Supple, no jugular venous distention. No thyroid enlargement, no tenderness.  LUNGS: Normal breath sounds bilaterally, no wheezing, rales,rhonchi or crepitation. No use of accessory muscles of respiration.  CARDIOVASCULAR: S1, S2 normal. No murmurs, rubs, or gallops.  ABDOMEN: Soft, nontender, nondistended. Bowel sounds present. No organomegaly or mass.  EXTREMITIES: No pedal edema, cyanosis, or clubbing.  NEUROLOGIC: Generalized weakness, Sensation intact. Gait not checked.  PSYCHIATRIC: The patient is alert and oriented x 3.  SKIN: No obvious rash, lesion, or ulcer.   LABORATORY PANEL:   CBC  Recent Labs Lab 11/26/14 0900  WBC 13.5*  HGB 12.9  HCT 38.3  PLT 159   ------------------------------------------------------------------------------------------------------------------  Chemistries   Recent Labs Lab 11/26/14 0900  NA 133*  K 3.6  CL 100*  CO2 23  GLUCOSE 176*  BUN 16  CREATININE 0.50  CALCIUM 8.1*  AST 15  ALT 16  ALKPHOS 96  BILITOT 0.7    ------------------------------------------------------------------------------------------------------------------  Cardiac Enzymes  Recent Labs Lab 11/26/14 0900  TROPONINI <0.03   ------------------------------------------------------------------------------------------------------------------  RADIOLOGY:  Dg Chest 2 View  11/26/2014   CLINICAL DATA:  Emesis, headache, back pain. Shortness of breath for 4 days with weakness.  EXAM: CHEST  2 VIEW  COMPARISON:  01/31/2014  FINDINGS: There is bilateral lung opacity, most confluent in the left mid lung. Although subpleural, this opacity is not particularly wedge-shaped. There is a bandlike opacity at the right base compatible with atelectasis. Normal heart size and stable mediastinal contours, including a probable small hiatal hernia.  IMPRESSION: 1. Left-sided airspace disease consistent with pneumonia. There is also a right basilar atelectasis, which could be related to underlying airway infection. 2. Followup PA and lateral chest X-ray is recommended in 3-4 weeks following trial of antibiotic therapy to ensure resolution and exclude underlying malignancy.   Electronically Signed   By: Marnee Spring M.D.   On: 11/26/2014 12:08   Ct Abdomen Pelvis W Contrast  11/26/2014   CLINICAL DATA:  Weakness and vomiting. Headache and back pain. Elevated white blood cell count.  EXAM: CT ABDOMEN AND PELVIS WITH CONTRAST  TECHNIQUE: Multidetector CT imaging of the abdomen and pelvis  was performed using the standard protocol following bolus administration of intravenous contrast.  CONTRAST:  25mL OMNIPAQUE IOHEXOL 240 MG/ML SOLN, OMNIPAQUE IOHEXOL 300 MG/ML SOLN  COMPARISON:  Chest radiograph 11/26/2014  FINDINGS: Bandlike density at the right lung base could represent atelectasis. Small amount of airspace disease identified in the left upper lobe likely corresponds with the known airspace disease on the recent chest radiograph. Small amount of volume  loss at the right middle lobe. No large pleural effusions. Negative for free intraperitoneal air.  There is a 2.6 cm low-density structure in the left hepatic lobe and most compatible with a hepatic cyst. Mild dilatation of the common bile duct measuring roughly 7 mm. The gallbladder is not distended. Very mild intrahepatic biliary dilatation. Portal venous system is patent.  Mild dilatation of the main pancreatic duct without gross pancreatic abnormality. Mild fullness near the ampulla without gross abnormality.  Normal appearance of the spleen and right adrenal gland. There is mild stranding near the left adrenal gland and left kidney upper pole of unknown etiology. There is no significant dilatation of the left renal collecting system. No suspicious left renal lesion. There is a small amount of fluid around the right kidney without hydronephrosis and no suspicious right renal lesion. Normal appearance of the urinary bladder. Uterus has been removed.  Suspect trace free fluid in the pelvis on sequence 2, image 76. There is no gross abnormality involving the small or large bowel. No gross abnormality to the stomach or duodenum.  Mild atherosclerotic disease in the aorta. No significant lymphadenopathy. Evidence for residual left ovarian tissue.  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 No significant anterolisthesis at L5-S1. Disc space narrowing at L5-S1.  IMPRESSION: Densities at both lung bases likely represent a combination of airspace disease and atelectasis. Please refer to the recent chest radiograph.  Mild edema and stranding around the kidneys, particularly upper pole regions. Findings are nonspecific and could be chronic.  Mild dilatation of the biliary system without evidence of an obstructing lesion.   Electronically Signed   By: Richarda Overlie M.D.   On: 11/26/2014 13:19    EKG:   Orders placed or performed during the hospital encounter of 11/26/14  . ED EKG   (only if pt is 74 y.o. or older and pain is  above umbilicus)  . ED EKG   (only if pt is 74 y.o. or older and pain is above umbilicus)    IMPRESSION AND PLAN:   #1 sepsis with leukocytosis and tachycardia secondary to bilateral lower lobe pneumonia with hypoxia Failed outpatient antibiotics Z-Pak Admit her to MedSurg unit Sputum cultures Broad-spectrum IV antibiotic Zosyn and levofloxacin Flu test was negative at PCPs office 2 days ago Oxygen via nasal cannula  #2 chronic history of COPD currently not in exacerbation Will provide her by mouth prednisone for bronchoconstriction  nebulizer treatments Continue singulair  #3 hyperlipidemia Continue her home medication statin   #4 constipation with generalized abdominal discomfort Laxatives  CT abdomen with no acute findings   #5 decreased by mouth intake probably from sepsis with pneumonia IV fluids PT consult for generalized weakness  Full code, daughters     All the records are reviewed and case discussed with ED provider. Management plans discussed with the patient, family and they are in agreement.  CODE STATUS: Full  TOTAL TIME TAKING CARE OF THIS PATIENT: 50 minutes.    Ramonita Lab M.D on 11/26/2014 at 4:34 PM  Between 7am to 6pm -  Pager - [phone]  After 6pm go to [email or web address] - password [email or web address]  [facility]  Office  [phone]  CC: Primary care physician; Pcp Not In System

## 2014-11-26 NOTE — ED Provider Notes (Signed)
[facility] Emergency Department Provider Note  ____________________________________________  Time seen: Approximately 11:16 AM  I have reviewed the triage vital signs and the nursing notes.   HISTORY  Chief Complaint Headache; Emesis; and Back Pain    HPI OAKLIE DURRETT is a 74 y.o. female is a history of asthma, previous smoker, and COPD. She states since about Monday or Tuesday she has been feeling generally ill, achy, developed a mild headache, vomiting, as well as upper abdominal pain. This caused her to vomit several times. At the present time she reports having a moderate headache but also moderate pain in her upper abdomen. She has been vomiting occasionally yellow and occasionally green. She denies that light makes her headache worse. She denies any numbness or tingling. She does feel "dehydrated". She feels like all of the muscles along her back are very achy, especially when she tries to get up.  She's been in bed for about the last 2 days. Normally she is up and ambulatory and works as a Holiday representative at [employer].  She denies any recent hospital stays. She was saw her doctor and has been on azithromycin for 2 days now as well as Vicodin for pain. She reports she just not getting better and wanted to come to the ER for further evaluation.   Past Medical History  Diagnosis Date  . Environmental allergies   . COPD (chronic obstructive pulmonary disease)   . GERD (gastroesophageal reflux disease)   . Asthma   . Hyperlipidemia     Patient Active Problem List   Diagnosis Date Noted  . SOB (shortness of breath) 02/24/2014  . DOE (dyspnea on exertion) 02/24/2014  . Cough 02/24/2014  . COPD bronchitis 02/24/2014    Past Surgical History  Procedure Laterality Date  . Vaginal hysterectomy      1960's  . Rotator cuff repair Right     Current Outpatient Rx  Name  Route  Sig  Dispense  Refill  . albuterol (PROAIR HFA) 108 (90 BASE) MCG/ACT inhaler    Inhalation   Inhale 2 puffs into the lungs every 6 (six) hours as needed for wheezing or shortness of breath.   1 Inhaler   3   . aspirin 81 MG tablet   Oral   Take 81 mg by mouth daily.         Marland Kitchen azelastine (ASTELIN) 0.1 % nasal spray   Each Nare   Place 1 spray into both nostrils daily. Use in each nostril as directed         . budesonide-formoterol (SYMBICORT) 160-4.5 MCG/ACT inhaler   Inhalation   Inhale 2 puffs into the lungs 2 (two) times daily.   1 Inhaler   6   . Fluticasone-Salmeterol (ADVAIR DISKUS) 500-50 MCG/DOSE AEPB   Inhalation   Inhale 1 puff into the lungs 2 (two) times daily.   60 each   11   . ipratropium (ATROVENT HFA) 17 MCG/ACT inhaler   Inhalation   Inhale 2 puffs into the lungs 2 (two) times daily as needed for wheezing.         . montelukast (SINGULAIR) 10 MG tablet   Oral   Take 10 mg by mouth at bedtime.         Marland Kitchen omeprazole (PRILOSEC) 20 MG capsule   Oral   Take 40 mg by mouth daily.         . Tiotropium Bromide Monohydrate (SPIRIVA RESPIMAT) 2.5 MCG/ACT AERS   Inhalation  Inhale 2 puffs into the lungs daily.   1 Inhaler   11     Allergies Valium  Family History  Problem Relation Age of Onset  . COPD Father   . Asthma Daughter   . Asthma Grandchild   . Cancer Daughter     breast    Social History History  Substance Use Topics  . Smoking status: Former Smoker -- 1.00 packs/day for 30 years    Types: Cigarettes    Quit date: 02/24/2009  . Smokeless tobacco: Never Used  . Alcohol Use: No    Review of Systems Constitutional: Feeling hot and occasionally chilled Eyes: No visual changes. ENT: No sore throat. Cardiovascular: Denies chest pain. Respiratory: Mild shortness of breath but no wheezing. No productive cough. She does use inhalers at home. Gastrointestinal: See history of present illness Genitourinary: Negative for dysuria. Musculoskeletal: Negative for back pain. Skin: Negative for  rash. Neurological: Negative for focal weakness or numbness.  10-point ROS otherwise negative.  ____________________________________________   PHYSICAL EXAM:  VITAL SIGNS: ED Triage Vitals  Enc Vitals Group     BP 11/26/14 0851 120/65 mmHg     Pulse Rate 11/26/14 0851 82     Resp 11/26/14 0851 18     Temp 11/26/14 0851 98.2 F (36.8 C)     Temp Source 11/26/14 0851 Oral     SpO2 11/26/14 0851 93 %     Weight 11/26/14 0851 140 lb (63.504 kg)     Height 11/26/14 0851 5\' 3"  (1.6 m)     Head Cir --      Peak Flow --      Pain Score 11/26/14 0852 10     Pain Loc --      Pain Edu? --      Excl. in GC? --     Constitutional: Alert and oriented. Fatigued appearing. She is slightly diaphoretic. Eyes: Conjunctivae are normal. PERRL. EOMI. no photophobia. No nuchal rigidity Head: Atraumatic. Nose: No congestion/rhinnorhea. Mouth/Throat: Mucous membranes are dry.  Oropharynx non-erythematous. Neck: No stridor.   Cardiovascular: Normal rate, regular rhythm. Grossly normal heart sounds.  Good peripheral circulation. Respiratory: Normal respiratory effort.  No retractions. Lungs CTAB. Of note, the patient's pulse oximetry is 86% with good waveform. We placed her on 2 L oxygen her oximetry 1 previous to the low 90s. There is no wheezing. Gastrointestinal: Soft but moderately tender over the epigastrium and right upper quadrant. No distention. No abdominal bruits. No CVA tenderness. Musculoskeletal: No lower extremity tenderness nor edema.  No joint effusions. Neurologic:  Normal speech and language. No gross focal neurologic deficits are appreciated. Speech is normal. No gait instability. Normal cranial nerve exam. Skin:  Skin is warm, dry and intact. No rash noted. Psychiatric: Mood and affect are normal. Speech and behavior are normal.  ____________________________________________   LABS (all labs ordered are listed, but only abnormal results are displayed)  Labs Reviewed  CBC  WITH DIFFERENTIAL/PLATELET - Abnormal; Notable for the following:    WBC 13.5 (*)    Neutro Abs 11.2 (*)    Monocytes Absolute 1.0 (*)    All other components within normal limits  COMPREHENSIVE METABOLIC PANEL - Abnormal; Notable for the following:    Sodium 133 (*)    Chloride 100 (*)    Glucose, Bld 176 (*)    Calcium 8.1 (*)    Albumin 3.1 (*)    All other components within normal limits  URINALYSIS COMPLETEWITH MICROSCOPIC ([facility] ONLY) - Abnormal;  Notable for the following:    Color, Urine YELLOW (*)    APPearance HAZY (*)    Glucose, UA 150 (*)    Ketones, ur TRACE (*)    Protein, ur 100 (*)    Bacteria, UA RARE (*)    Squamous Epithelial / LPF 0-5 (*)    All other components within normal limits  LIPASE, BLOOD  TROPONIN I   ____________________________________________  EKG   Date: 11/26/2014  Rate: 90  Rhythm: normal sinus rhythm  QRS Axis: normal  Intervals: normal  ST/T Wave abnormalities: On specific T wave abnormality, with minimal depression in lateral leads  Conduction Disutrbances: none  Narrative Interpretation: Sinus rhythm with nonspecific T-wave abnormality     ____________________________________________  RADIOLOGY  CT ABDOMEN AND PELVIS WITH CONTRAST  TECHNIQUE: Multidetector CT imaging of the abdomen and pelvis was performed using the standard protocol following bolus administration of intravenous contrast.  CONTRAST: 25mL OMNIPAQUE IOHEXOL 240 MG/ML SOLN, OMNIPAQUE IOHEXOL 300 MG/ML SOLN  COMPARISON: Chest radiograph 11/26/2014  FINDINGS: Bandlike density at the right lung base could represent atelectasis. Small amount of airspace disease identified in the left upper lobe likely corresponds with the known airspace disease on the recent chest radiograph. Small amount of volume loss at the right middle lobe. No large pleural effusions. Negative for free intraperitoneal air.  There is a 2.6 cm low-density structure in the left  hepatic lobe and most compatible with a hepatic cyst. Mild dilatation of the common bile duct measuring roughly 7 mm. The gallbladder is not distended. Very mild intrahepatic biliary dilatation. Portal venous system is patent.  Mild dilatation of the main pancreatic duct without gross pancreatic abnormality. Mild fullness near the ampulla without gross abnormality.  Normal appearance of the spleen and right adrenal gland. There is mild stranding near the left adrenal gland and left kidney upper pole of unknown etiology. There is no significant dilatation of the left renal collecting system. No suspicious left renal lesion. There is a small amount of fluid around the right kidney without hydronephrosis and no suspicious right renal lesion. Normal appearance of the urinary bladder. Uterus has been removed.  Suspect trace free fluid in the pelvis on sequence 2, image 76. There is no gross abnormality involving the small or large bowel. No gross abnormality to the stomach or duodenum.  Mild atherosclerotic disease in the aorta. No significant lymphadenopathy. Evidence for residual left ovarian tissue.  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 No significant anterolisthesis at L5-S1. Disc space narrowing at L5-S1.  IMPRESSION: Densities at both lung bases likely represent a combination of airspace disease and atelectasis. Please refer to the recent chest radiograph.  Mild edema and stranding around the kidneys, particularly upper pole regions. Findings are nonspecific and could be chronic.  Mild dilatation of the biliary system without evidence of an obstructing lesion.   Electronically Signed By: Richarda Overlie M.D. On: 11/26/2014 13:19          DG Chest 2 View (Final result) Result time: 11/26/14 12:08:37   Final result by Rad Results In Interface (11/26/14 12:08:37)   Narrative:   CLINICAL DATA: Emesis, headache, back pain. Shortness of breath for 4 days with  weakness.  EXAM: CHEST 2 VIEW  COMPARISON: 01/31/2014  FINDINGS: There is bilateral lung opacity, most confluent in the left mid lung. Although subpleural, this opacity is not particularly wedge-shaped. There is a bandlike opacity at the right base compatible with atelectasis. Normal heart size and stable mediastinal  contours, including a probable small hiatal hernia.  IMPRESSION: 1. Left-sided airspace disease consistent with pneumonia. There is also a right basilar atelectasis, which could be related to underlying airway infection. 2. Followup PA and lateral chest X-ray is recommended in 3-4 weeks following trial of antibiotic therapy to ensure resolution and exclude underlying malignancy.     ____________________________________________   PROCEDURES  Procedure(s) performed: None  Critical Care performed: No  ____________________________________________   INITIAL IMPRESSION / ASSESSMENT AND PLAN / ED COURSE  Pertinent labs & imaging results that were available during my care of the patient were reviewed by me and considered in my medical decision making (see chart for details).  She presents with fever, mild hypoxia, weakness, not improving on azithromycin. She denies feeling short of breath. She does however have some tenderness in the upper epigastrium, in addition she does have a notable oxygen deficit. She is not wheezing, and her work of breathing seems to be normal.  Elevated white count, myalgias, fever, she does not have any meningismus or nuchal rigidity. The suspect she is mildly dehydrated, as well as likely ____________________________________________  ----------------------------------------- 4:17 PM on 11/26/2014 -----------------------------------------  Discussed the patient diagnosis as well as her family. I find she is likely suffering from bilateral pneumonias which is responsible for her hypoxia, fevers, and elevated white count. She continues  to note that she feels sore. We'll give her some morphine here for pain. Starting initially low-dose. Her oxygen saturation is in the low 90s on 2 L. I will give her an additional DuoNeb, though she is presently not wheezing. Her lungs decreased continued to sound clear at this time and her work of breathing is normal.  FINAL CLINICAL IMPRESSION(S) / ED DIAGNOSES  Final diagnoses:  Bilateral pneumonia      Sharyn Creamer, MD 11/26/14 [phone]

## 2014-11-26 NOTE — ED Notes (Signed)
Pt to ed with c/o headache, back pain, vomiting, decreased urine output since Tuesday.  Pt reports feeling weak.

## 2014-11-27 LAB — CBC
HCT: 35 % (ref 35.0–47.0)
Hemoglobin: 11.8 g/dL — ABNORMAL LOW (ref 12.0–16.0)
MCH: 31.5 pg (ref 26.0–34.0)
MCHC: 33.8 g/dL (ref 32.0–36.0)
MCV: 93.1 fL (ref 80.0–100.0)
Platelets: 167 10*3/uL (ref 150–440)
RBC: 3.76 MIL/uL — ABNORMAL LOW (ref 3.80–5.20)
RDW: 12.9 % (ref 11.5–14.5)
WBC: 12.5 10*3/uL — ABNORMAL HIGH (ref 3.6–11.0)

## 2014-11-27 LAB — BASIC METABOLIC PANEL
Anion gap: 7 (ref 5–15)
BUN: 13 mg/dL (ref 6–20)
CO2: 23 mmol/L (ref 22–32)
Calcium: 7.5 mg/dL — ABNORMAL LOW (ref 8.9–10.3)
Chloride: 105 mmol/L (ref 101–111)
Creatinine, Ser: 0.5 mg/dL (ref 0.44–1.00)
GFR calc Af Amer: >60 mL/min (ref >60)
GLUCOSE: 150 mg/dL — AB (ref 65–99)
POTASSIUM: 3.5 mmol/L (ref 3.5–5.1)
Sodium: 135 mmol/L (ref 135–145)

## 2014-11-27 MED ORDER — PROCHLORPERAZINE MALEATE 10 MG PO TABS
5.0000 mg | ORAL_TABLET | Freq: Three times a day (TID) | ORAL | Status: DC | PRN
  Administered 2014-11-27: 5 mg via ORAL
  Filled 2014-11-27: qty 1

## 2014-11-27 NOTE — Progress Notes (Signed)
[facility] - [place] at [facility]   PATIENT NAME: Sandy Eaton    MR#:  [id number]  DATE OF BIRTH:  1940/10/01  SUBJECTIVE:  Breathing is a lot better. Overall feels good today. Getting Nebs rx  REVIEW OF SYSTEMS:    Review of Systems  Constitutional: Positive for malaise/fatigue. Negative for fever, chills and weight loss.  HENT: Negative for ear discharge, ear pain and nosebleeds.   Eyes: Negative for blurred vision, pain and discharge.  Respiratory: Positive for cough and shortness of breath. Negative for sputum production, wheezing and stridor.   Cardiovascular: Negative for chest pain, palpitations, orthopnea and PND.  Gastrointestinal: Negative for nausea, vomiting, abdominal pain and diarrhea.  Genitourinary: Negative for urgency and frequency.  Musculoskeletal: Negative for back pain and joint pain.  Neurological: Positive for weakness. Negative for sensory change, speech change and focal weakness.  Psychiatric/Behavioral: Negative for depression. The patient is not nervous/anxious.   All other systems reviewed and are negative.   DRUG ALLERGIES:   Allergies  Allergen Reactions  . Augmentin [Amoxicillin-Pot Clavulanate] Nausea And Vomiting  . Valium [Diazepam] Other (See Comments)    Reaction:  Hallucinations     VITALS:  Blood pressure 100/53, pulse 79, temperature 98.5 F (36.9 C), temperature source Oral, resp. rate 20, height  (1.6 m), weight 67.223 kg (148 lb 3.2 oz), SpO2 90 %.  PHYSICAL EXAMINATION:   Physical Exam  GENERAL:  74 y.o.-year-old patient lying in the bed with no acute distress.  EYES: Pupils equal, round, reactive to light and accommodation. No scleral icterus. Extraocular muscles intact.  HEENT: Head atraumatic, normocephalic. Oropharynx and nasopharynx clear.  NECK:  Supple, no jugular venous distention. No thyroid enlargement, no tenderness.  LUNGS: Normal breath sounds bilaterally, no wheezing, LL  Rales+.  No use of accessory muscles of respiration.  CARDIOVASCULAR: S1, S2 normal. No murmurs, rubs, or gallops.  ABDOMEN: Soft, nontender, nondistended. Bowel sounds present. No organomegaly or mass.  EXTREMITIES: No cyanosis, clubbing or edema b/l.    NEUROLOGIC: Cranial nerves II through XII are intact. No focal Motor or sensory deficits b/l.   PSYCHIATRIC: The patient is alert and oriented x 3.  SKIN: No obvious rash, lesion, or ulcer.    LABORATORY PANEL:   CBC  Recent Labs Lab 11/27/14 0449  WBC 12.5*  HGB 11.8*  HCT 35.0  PLT 167   ------------------------------------------------------------------------------------------------------------------  Chemistries   Recent Labs Lab 11/26/14 0900 11/27/14 0449  NA 133* 135  K 3.6 3.5  CL 100* 105  CO2 23 23  GLUCOSE 176* 150*  BUN 16 13  CREATININE 0.50 0.50  CALCIUM 8.1* 7.5*  AST 15  --   ALT 16  --   ALKPHOS 96  --   BILITOT 0.7  --    ------------------------------------------------------------------------------------------------------------------  Cardiac Enzymes  Recent Labs Lab 11/26/14 0900  TROPONINI <0.03   ------------------------------------------------------------------------------------------------------------------  RADIOLOGY:  Dg Chest 2 View  11/26/2014   CLINICAL DATA:  Emesis, headache, back pain. Shortness of breath for 4 days with weakness.  EXAM: CHEST  2 VIEW  COMPARISON:  01/31/2014  FINDINGS: There is bilateral lung opacity, most confluent in the left mid lung. Although subpleural, this opacity is not particularly wedge-shaped. There is a bandlike opacity at the right base compatible with atelectasis. Normal heart size and stable mediastinal contours, including a probable small hiatal hernia.  IMPRESSION: 1. Left-sided airspace disease consistent with pneumonia. There is also a right basilar atelectasis, which could be related  to underlying airway infection. 2. Followup PA and lateral chest  X-ray is recommended in 3-4 weeks following trial of antibiotic therapy to ensure resolution and exclude underlying malignancy.   Electronically Signed   By: Marnee Spring M.D.   On: 11/26/2014 12:08   Ct Abdomen Pelvis W Contrast  11/26/2014   CLINICAL DATA:  Weakness and vomiting. Headache and back pain. Elevated white blood cell count.  EXAM: CT ABDOMEN AND PELVIS WITH CONTRAST  TECHNIQUE: Multidetector CT imaging of the abdomen and pelvis was performed using the standard protocol following bolus administration of intravenous contrast.  CONTRAST:  25mL OMNIPAQUE IOHEXOL 240 MG/ML SOLN, OMNIPAQUE IOHEXOL 300 MG/ML SOLN  COMPARISON:  Chest radiograph 11/26/2014  FINDINGS: Bandlike density at the right lung base could represent atelectasis. Small amount of airspace disease identified in the left upper lobe likely corresponds with the known airspace disease on the recent chest radiograph. Small amount of volume loss at the right middle lobe. No large pleural effusions. Negative for free intraperitoneal air.  There is a 2.6 cm low-density structure in the left hepatic lobe and most compatible with a hepatic cyst. Mild dilatation of the common bile duct measuring roughly 7 mm. The gallbladder is not distended. Very mild intrahepatic biliary dilatation. Portal venous system is patent.  Mild dilatation of the main pancreatic duct without gross pancreatic abnormality. Mild fullness near the ampulla without gross abnormality.  Normal appearance of the spleen and right adrenal gland. There is mild stranding near the left adrenal gland and left kidney upper pole of unknown etiology. There is no significant dilatation of the left renal collecting system. No suspicious left renal lesion. There is a small amount of fluid around the right kidney without hydronephrosis and no suspicious right renal lesion. Normal appearance of the urinary bladder. Uterus has been removed.  Suspect trace free fluid in the pelvis on  sequence 2, image 76. There is no gross abnormality involving the small or large bowel. No gross abnormality to the stomach or duodenum.  Mild atherosclerotic disease in the aorta. No significant lymphadenopathy. Evidence for residual left ovarian tissue.  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 No significant anterolisthesis at L5-S1. Disc space narrowing at L5-S1.  IMPRESSION: Densities at both lung bases likely represent a combination of airspace disease and atelectasis. Please refer to the recent chest radiograph.  Mild edema and stranding around the kidneys, particularly upper pole regions. Findings are nonspecific and could be chronic.  Mild dilatation of the biliary system without evidence of an obstructing lesion.   Electronically Signed   By: Richarda Overlie M.D.   On: 11/26/2014 13:19     ASSESSMENT AND PLAN:  74 y.o. female with a known history of COPD, previous smoker, GERD and hyperlipidemia is been feeling sick from Monday. Patient was having nausea vomiting and abdominal pain   #1 sepsis with leukocytosis and tachycardia secondary to bilateral lower lobe pneumonia with hypoxia Failed outpatient antibiotics Z-Pak Sputum cultures IV levofloxacin for CAP Flu test was negative at PCPs office 2 days ago Oxygen via nasal cannula  #2 chronic history of COPD currently not in exacerbation cont prednisone for bronchoconstriction nebulizer treatments Continue singulair  #3 hyperlipidemia Continue her home medication statin  #4 constipation with generalized abdominal discomfort Laxatives CT abdomen with no acute findings  #5 decreased by mouth intake probably from sepsis with pneumonia IV fluids PT consult for generalized weakness   Management plans discussed with the patient and agreement.  CODE STATUS:FULL  DVT Prophylaxis: heparin  TOTAL TIME TAKING CARE OF THIS PATIENT: 35inutes.   POSSIBLE D/C IN 1-2YS, DEPENDING ON CLINICAL CONDITION.   Demarus Latterell M.D on 11/27/2014 at 1:10  PM  Between 7am to 6pm - Pager - [phone]  After 6pm go to [email or web address] - password [email or web address]  [facility]  Office  [phone]  CC: Primary care physician; Pcp Not In System

## 2014-11-27 NOTE — Plan of Care (Signed)
Problem: Discharge Progression Outcomes Goal: Barriers To Progression Addressed/Resolved Outcome: Progressing individualization :   74 year old female admitted to 1c from ER with bilateral pneumonia Pt lives at home by herself, but family visits frequently Pt is a former smoker, but has quit 20 years ago Monitor vs, temp Rocephin started in the ER, continue with IV abts as tolerated. Goal: Other Discharge Outcomes/Goals Outcome: Not Progressing Plan of care progress to goal:  D/c plan : per pt plans to go back home alone upon d/c  02 sats: pt remains on oxygen at 2L sats in low to mid 90's not on oxygen at home  Pain : pt complaining of pain off and on through night relief at times with medication  Diet : unable to eat due to nausea with only mild relief throughout night

## 2014-11-27 NOTE — Progress Notes (Signed)
Dr Allena Katz, compazine 5 mg po Q8 hrs PRN

## 2014-11-27 NOTE — Plan of Care (Signed)
Problem: Discharge Progression Outcomes Goal: Barriers To Progression Addressed/Resolved individualization Outcome: Progressing Address patient as Sandy Eaton. Patient is from home. Patient has history of COPD, Gerd, Asthma,Hyperlipidema, controlled by home medications. Goal: Other Discharge Outcomes/Goals Outcome: Progressing Plan of care progress to goal:   D/c plan : per pt plans to go back home alone upon d/c   02 sats: pt remains on oxygen at 4L sats in low to mid 90's not on oxygen at home   Pain : Pain medications given with noted relief. Diet : No complaints of nausea, receiving scheduled nausea meds. Tolerating diet.

## 2014-11-28 MED ORDER — IPRATROPIUM-ALBUTEROL 0.5-2.5 (3) MG/3ML IN SOLN
3.0000 mL | Freq: Four times a day (QID) | RESPIRATORY_TRACT | Status: DC
  Administered 2014-11-29 (×2): 3 mL via RESPIRATORY_TRACT
  Filled 2014-11-28 (×2): qty 3

## 2014-11-28 MED ORDER — SODIUM CHLORIDE 0.9 % IJ SOLN: 3.0000 mL | INTRAMUSCULAR | Status: DC | PRN

## 2014-11-28 MED ORDER — LEVOFLOXACIN 750 MG PO TABS
750.0000 mg | ORAL_TABLET | Freq: Every day | ORAL | Status: DC
  Administered 2014-11-28: 750 mg via ORAL
  Filled 2014-11-28: qty 1

## 2014-11-28 MED ORDER — ONDANSETRON HCL 4 MG PO TABS: 4.0000 mg | ORAL_TABLET | Freq: Three times a day (TID) | ORAL | Status: DC | PRN

## 2014-11-28 MED ORDER — LEVOFLOXACIN 750 MG PO TABS: 750.0000 mg | ORAL_TABLET | Freq: Every day | ORAL | Status: DC

## 2014-11-28 MED ORDER — SODIUM CHLORIDE 0.9 % IJ SOLN
3.0000 mL | Freq: Three times a day (TID) | INTRAMUSCULAR | Status: DC
  Administered 2014-11-28 – 2014-11-29 (×6): 3 mL via INTRAVENOUS

## 2014-11-28 NOTE — Progress Notes (Signed)
[facility] - [place] at [facility]   PATIENT NAME: Sandy Eaton    MR#:  [id number]  DATE OF BIRTH:  [birth year]  SUBJECTIVE:  Breathing is a lot better. Still requiring 4Liter [place] oxygen  REVIEW OF SYSTEMS:    Review of Systems  Constitutional: Positive for malaise/fatigue. Negative for fever, chills and weight loss.  HENT: Negative for ear discharge, ear pain and nosebleeds.   Eyes: Negative for blurred vision, pain and discharge.  Respiratory: Positive for cough and shortness of breath. Negative for sputum production, wheezing and stridor.   Cardiovascular: Negative for chest pain, palpitations, orthopnea and PND.  Gastrointestinal: Negative for nausea, vomiting, abdominal pain and diarrhea.  Genitourinary: Negative for urgency and frequency.  Musculoskeletal: Negative for back pain and joint pain.  Neurological: Positive for weakness. Negative for sensory change, speech change and focal weakness.  Psychiatric/Behavioral: Negative for depression. The patient is not nervous/anxious.   All other systems reviewed and are negative.   DRUG ALLERGIES:   Allergies  Allergen Reactions  . Augmentin [Amoxicillin-Pot Clavulanate] Nausea And Vomiting  . Valium [Diazepam] Other (See Comments)    Reaction:  Hallucinations     VITALS:  Blood pressure 92/57, pulse 97, temperature 98 F (36.7 C), temperature source Oral, resp. rate 20, height  (1.6 m), weight 67.223 kg (148 lb 3.2 oz), SpO2 93 %.  PHYSICAL EXAMINATION:   Physical Exam  GENERAL:  74 y.o.-year-old patient lying in the bed with no acute distress.  EYES: Pupils equal, round, reactive to light and accommodation. No scleral icterus. Extraocular muscles intact.  HEENT: Head atraumatic, normocephalic. Oropharynx and nasopharynx clear.  NECK:  Supple, no jugular venous distention. No thyroid enlargement, no tenderness.  LUNGS: distant breath sounds bilaterally, no wheezing, LL  Rales+. No use of  accessory muscles of respiration.  CARDIOVASCULAR: S1, S2 normal. No murmurs, rubs, or gallops.  ABDOMEN: Soft, nontender, nondistended. Bowel sounds present. No organomegaly or mass.  EXTREMITIES: No cyanosis, clubbing or edema b/l.    NEUROLOGIC: Cranial nerves II through XII are intact. No focal Motor or sensory deficits b/l.   PSYCHIATRIC: The patient is alert and oriented x 3.  SKIN: No obvious rash, lesion, or ulcer.    LABORATORY PANEL:   CBC  Recent Labs Lab 11/27/14 0449  WBC 12.5*  HGB 11.8*  HCT 35.0  PLT 167   ------------------------------------------------------------------------------------------------------------------  Chemistries   Recent Labs Lab 11/26/14 0900 11/27/14 0449  NA 133* 135  K 3.6 3.5  CL 100* 105  CO2 23 23  GLUCOSE 176* 150*  BUN 16 13  CREATININE 0.50 0.50  CALCIUM 8.1* 7.5*  AST 15  --   ALT 16  --   ALKPHOS 96  --   BILITOT 0.7  --    ------------------------------------------------------------------------------------------------------------------  Cardiac Enzymes  Recent Labs Lab 11/26/14 0900  TROPONINI <0.03   ------------------------------------------------------------------------------------------------------------------  RADIOLOGY:  No results found.   ASSESSMENT AND PLAN:  74 y.o. female with a known history of COPD, previous smoker, GERD and hyperlipidemia is been feeling sick from Monday. Patient was having nausea vomiting and abdominal pain   #1 sepsis with leukocytosis and tachycardia secondary to bilateral lower lobe pneumonia with hypoxia Failed outpatient antibiotics Z-Pak IV levofloxacin for CAP Flu test was negative at PCPs office 2 days ago Oxygen via nasal cannula  #2 chronic history of COPD currently not in exacerbation cont prednisone for bronchoconstriction nebulizer treatments Continue singulair Still requiring oxygen. Will likley need oxygen at home -  incentive spirometer  #3  hyperlipidemia Continue her home medication statin  #4 constipation with generalized abdominal discomfort Laxatives CT abdomen with no acute findings  #5 decreased by mouth intake probably from sepsis with pneumonia IV fluids PT consult for generalized weakness   Management plans discussed with the patient and agreement.  CODE STATUS:FULL  DVT Prophylaxis: heparin  TOTAL TIME TAKING CARE OF THIS PATIENT: 35inutes.   POSSIBLE D/C IN 1-2YS, DEPENDING ON CLINICAL CONDITION.   Tenea Sens M.D on 11/28/2014 at 1:17 PM  Between 7am to 6pm - Pager - [phone]  After 6pm go to [email or web address] - password [email or web address]  [facility]  Office  [phone]  CC: Primary care physician; Pcp Not In System

## 2014-11-28 NOTE — Evaluation (Signed)
Physical Therapy Evaluation Patient Details Name: Sandy Eaton MRN: [id number] DOB: [birth year] Today's Date: 11/28/2014   History of Present Illness  PNA, needing O2  Clinical Impression  Pt is able to ambulate well w/o AD and shows good balance and confidence.  She is slow and has some fatigue with O2 dropping to the mid 80s.  Pt with no safety issues or losses of balance.     Follow Up Recommendations No PT follow up    Equipment Recommendations       Recommendations for Other Services       Precautions / Restrictions Precautions Precautions: Fall Restrictions Weight Bearing Restrictions: No      Mobility  Bed Mobility                  Transfers Overall transfer level: Independent Equipment used: None             General transfer comment: Pt stands up without issue, shows good confidence  Ambulation/Gait Ambulation/Gait assistance: Independent Ambulation Distance (Feet): 150 Feet Assistive device: None (on 6 liters o2 )       General Gait Details: Pt walks with good confidence, though she has a slow, calculated gait.  Pt with some fatigue, o2 drops to the mid 80s with the effort  Stairs Stairs: Yes Stairs assistance: Modified independent (Device/Increase time) Stair Management: One rail Right Number of Stairs: 6    Wheelchair Mobility    Modified Rankin (Stroke Patients Only)       Balance Overall balance assessment: Independent                                           Pertinent Vitals/Pain Pain Assessment: No/denies pain    Home Living Family/patient expects to be discharged to:: Private residence Living Arrangements: Children Available Help at Discharge: Family Type of Home: House Home Access: Stairs to enter Entrance Stairs-Rails: Can reach both Entrance Stairs-Number of Steps: 3   Home Equipment: None      Prior Function Level of Independence: Independent (pt still working)                Higher education careers adviser        Extremity/Trunk Assessment   Upper Extremity Assessment: Overall WFL for tasks assessed           Lower Extremity Assessment: Overall WFL for tasks assessed         Communication   Communication: No difficulties  Cognition Arousal/Alertness: Awake/alert Behavior During Therapy: WFL for tasks assessed/performed Overall Cognitive Status: Within Functional Limits for tasks assessed                      General Comments      Exercises        Assessment/Plan    PT Assessment Patient needs continued PT services  PT Diagnosis Generalized weakness   PT Problem List Decreased strength;Decreased activity tolerance;Decreased balance;Decreased mobility  PT Treatment Interventions Gait training;Therapeutic activities;Therapeutic exercise;Balance training   PT Goals (Current goals can be found in the Care Plan section) Acute Rehab PT Goals Patient Stated Goal: pt states she wants to go home, but thinks she'll need O2 PT Goal Formulation: With patient Time For Goal Achievement: 12/12/14 Potential to Achieve Goals: Good    Frequency Min 2X/week   Barriers to discharge        Co-evaluation  End of Session Equipment Utilized During Treatment: Gait belt;Oxygen (5 liters at rest, 6 during ambulation) Activity Tolerance: Patient tolerated treatment well;Patient limited by fatigue Patient left: with chair alarm set           Time: 7672-0947 PT Time Calculation (min) (ACUTE ONLY): 17 min   Charges:   PT Evaluation $Initial PT Evaluation Tier I: 1 Procedure     PT G Codes:       Sandy Eaton, PT, DPT [phone]  Sandy Eaton 11/28/2014, 3:30 PM

## 2014-11-28 NOTE — Plan of Care (Addendum)
Problem: Discharge Progression Outcomes Goal: Other Discharge Outcomes/Goals Outcome: Progressing Progress to goals: 1. Plans to return home at discharge; no barriers identified. Levaquin changed to po. 2. 02 sats dropped twice and had to be back adjusted up to 5l/[place] to maintain sats; reports pulse ox 93 % with 02 while working with PT. 3. Less pain reported with tylenol given for HA so far this shift with relief. 4. Appetite improving with pt eating more of diet. Denied nausea with zofran changed to prn now. 5. Up to [facility]/ ambulated in hall with 02 with PT.

## 2014-11-28 NOTE — Plan of Care (Signed)
Problem: Discharge Progression Outcomes Goal: Other Discharge Outcomes/Goals Plan of care progress to goals: 1. Discharge plan: pt states she would like to go back home 2. O2 sats: remains on 4L [place], saturation @ 91%. Baseline room air at home. 3. Pain: Pain medications given with noted relief. 4. Diet: c/o nausea receiving scheduled nausea meds and given PRN compazine with noted relief.  5. Activity: +1 assist to bathroom  No fall/injury this shift. Will continue to assess.

## 2014-11-29 MED ORDER — LEVOFLOXACIN 750 MG PO TABS: 750.0000 mg | ORAL_TABLET | Freq: Every day | ORAL | Status: DC

## 2014-11-29 MED ORDER — PREDNISONE 10 MG PO TABS: ORAL_TABLET | ORAL | Status: DC

## 2014-11-29 MED ORDER — ASPIRIN 81 MG PO TABS
81.0000 mg | ORAL_TABLET | Freq: Every day | ORAL | Status: DC
Start: 2014-11-29 — End: 2015-05-05

## 2014-11-29 NOTE — Progress Notes (Signed)
[facility]    Sandy Eaton was admitted to the Hospital on 11/26/2014 and Discharged  11/29/2014 and should be excused from work/school  for 7 days starting 11/29/2014 , may return to work/school without any restrictions.  Call Sandy Finner MD, [facility]  [phone] with questions.  Sandy Eaton M.D on 11/29/2014,at 7:24 AM

## 2014-11-29 NOTE — Progress Notes (Signed)
Pt d/c home via wheelchair escorted by staff and family  

## 2014-11-29 NOTE — Plan of Care (Signed)
Problem: Discharge Progression Outcomes Goal: Other Discharge Outcomes/Goals Outcome: Progressing Plan of care progress to goals: Discharge plan- Plan to return home at discharge; no barriers identified. Baseline O2- patients O2 saturations have remained >92%, Oxygen continues at 5Liters/min. Pain- C/o headache, tylenol given with little improvement, Percocet 1 tab given with improvement noted.  Diet- No c/o nausea or vomiting.  Activity appropriate for discharge- Up to [facility]. No injury or falls this shift, continue to assess and monitor.

## 2014-11-29 NOTE — Care Management (Addendum)
Admitted to [facility] with the diagnosis of pneumonia. Lives with daughter, Sarajane Jews. Sees Dr. Suezanne Jacquet at [facility] in [place]. Seen in clinic last Wednesday. No home health. No skilled facility. No home oxygen in the past. Takes care of all basic activities of daily living herself. Daughter will transport Discharge to home today per Dr. Allena Katz. Will qualify for home oxygen. Discussed home oxygen companies. Chose [facility]. Will update Will at [facility]. 86% on room air yesterday 11/28/14 at 0724 Gwenette Greet RN MSN Care Management [phone]

## 2014-11-29 NOTE — Plan of Care (Signed)
Problem: Discharge Progression Outcomes Goal: Other Discharge Outcomes/Goals Patient discharged home per MD order. All discharge instructions given and all questions answered. Escorted by family and aux.

## 2014-11-29 NOTE — Discharge Summary (Signed)
[facility] - [place] at [facility]   PATIENT NAME: Sandy Eaton    MR#:  [id number]  DATE OF BIRTH:  28-Sep-1940  DATE OF ADMISSION:  11/26/2014 ADMITTING PHYSICIAN: Ramonita Lab, MD  DATE OF DISCHARGE: 11/29/14  PRIMARY CARE PHYSICIAN: Cyndie Mull, MD ([facility])   ADMISSION DIAGNOSIS:  Bilateral pneumonia [J18.9]  DISCHARGE DIAGNOSIS:  Acute on chronic COPD flare Pneumonia Hypoxia now on 3 liter [place] oxygen  SECONDARY DIAGNOSIS:   Past Medical History  Diagnosis Date  . Environmental allergies   . COPD (chronic obstructive pulmonary disease)   . GERD (gastroesophageal reflux disease)   . Asthma   . Hyperlipidemia     HOSPITAL COURSE:   74 y.o. female with a known history of COPD, previous smoker, GERD and hyperlipidemia is been feeling sick from Monday. Patient was having nausea vomiting and abdominal pain  #1 sepsis with leukocytosis and tachycardia secondary to bilateral lower lobe pneumonia with hypoxia Failed outpatient antibiotics Z-Pak IV levofloxacin for CAP---.changed to po abxs Flu test was negative at PCPs office 2 days ago Oxygen via nasal cannula  #2 chronic history of COPD currently not in exacerbation cont prednisone for bronchoconstriction nebulizer treatments Continue singulair -pt desated to 86-87% on RA with exeriton and 92% on 3 liter [place]. Will need oxygen likely temporary at home -incentive spirometer  #3 hyperlipidemia Continue  statin  #4 constipation with generalized abdominal discomfort Laxatives CT abdomen with no acute findings  #5 PT consult for generalized weakness noted. NO PT needs at home  DISCHARGE CONDITIONS:   fair  CONSULTS OBTAINED:   none  DRUG ALLERGIES:   Allergies  Allergen Reactions  . Augmentin [Amoxicillin-Pot Clavulanate] Nausea And Vomiting  . Valium [Diazepam] Other (See Comments)    Reaction:  Hallucinations     DISCHARGE MEDICATIONS:   Current  Discharge Medication List    START taking these medications   Details  levofloxacin (LEVAQUIN) 750 MG tablet Take 1 tablet (750 mg total) by mouth daily. Qty: 3 tablet, Refills: 0    predniSONE (DELTASONE) 10 MG tablet Label and dispense accordingly Day 1 take 40 mg Day 2 take 30 mg Day 3 take 20 mg Day 4 take 10 mg then stop Qty: 10 tablet, Refills: 0      CONTINUE these medications which have CHANGED   Details  aspirin 81 MG tablet Take 1 tablet (81 mg total) by mouth daily. Qty: 30 tablet, Refills: 0      CONTINUE these medications which have NOT CHANGED   Details  albuterol (PROAIR HFA) 108 (90 BASE) MCG/ACT inhaler Inhale 2 puffs into the lungs every 6 (six) hours as needed for wheezing or shortness of breath. Qty: 1 Inhaler, Refills: 3    aspirin EC 81 MG tablet Take 81 mg by mouth daily.    atorvastatin (LIPITOR) 40 MG tablet Take 40 mg by mouth daily.    beclomethasone (QVAR) 80 MCG/ACT inhaler Inhale 1 puff into the lungs 2 (two) times daily.    fluticasone (FLONASE) 50 MCG/ACT nasal spray Place 1 spray into both nostrils daily as needed for rhinitis.    HYDROcodone-acetaminophen (NORCO/VICODIN) 5-325 MG per tablet Take 1 tablet by mouth every 6 (six) hours as needed for moderate pain.    montelukast (SINGULAIR) 10 MG tablet Take 10 mg by mouth daily.     omeprazole (PRILOSEC) 40 MG capsule Take 40 mg by mouth daily.      STOP taking these medications  azelastine (ASTELIN) 0.1 % nasal spray      budesonide-formoterol (SYMBICORT) 160-4.5 MCG/ACT inhaler       If you experience worsening of your admission symptoms, develop shortness of breath, life threatening emergency, suicidal or homicidal thoughts you must seek medical attention immediately by calling 911 or calling your MD immediately  if symptoms less severe.  You Must read complete instructions/literature along with all the possible adverse reactions/side effects for all the Medicines you take and  that have been prescribed to you. Take any new Medicines after you have completely understood and accept all the possible adverse reactions/side effects.   Please note  You were cared for by a hospitalist during your hospital stay. If you have any questions about your discharge medications or the care you received while you were in the hospital after you are discharged, you can call the unit and asked to speak with the hospitalist on call if the hospitalist that took care of you is not available. Once you are discharged, your primary care physician will handle any further medical issues. Please note that NO REFILLS for any discharge medications will be authorized once you are discharged, as it is imperative that you return to your primary care physician (or establish a relationship with a primary care physician if you do not have one) for your aftercare needs so that they can reassess your need for medications and monitor your lab values. Today   SUBJECTIVE   Feels better. Mild headache  VITAL SIGNS:  Blood pressure 115/72, pulse 75, temperature 98.5 F (36.9 C), temperature source Oral, resp. rate 18, height  (1.6 m), weight 67.223 kg (148 lb 3.2 oz), SpO2 91 %.  I/O:   Intake/Output Summary (Last 24 hours) at 11/29/14 0734 Last data filed at 11/28/14 2130  Gross per 24 hour  Intake    960 ml  Output      0 ml  Net    960 ml    PHYSICAL EXAMINATION:  GENERAL:  74 y.o.-year-old patient lying in the bed with no acute distress.  EYES: Pupils equal, round, reactive to light and accommodation. No scleral icterus. Extraocular muscles intact.  HEENT: Head atraumatic, normocephalic. Oropharynx and nasopharynx clear.  NECK:  Supple, no jugular venous distention. No thyroid enlargement, no tenderness.  LUNGS: Normal breath sounds bilaterally, no wheezing, rales,rhonchi or crepitation. No use of accessory muscles of respiration.  CARDIOVASCULAR: S1, S2 normal. No murmurs, rubs, or gallops.   ABDOMEN: Soft, non-tender, non-distended. Bowel sounds present. No organomegaly or mass.  EXTREMITIES: No pedal edema, cyanosis, or clubbing.  NEUROLOGIC: Cranial nerves II through XII are intact. Muscle strength 5/5 in all extremities. Sensation intact. Gait not checked.  PSYCHIATRIC: The patient is alert and oriented x 3.  SKIN: No obvious rash, lesion, or ulcer.   DATA REVIEW:   CBC   Recent Labs Lab 11/27/14 0449  WBC 12.5*  HGB 11.8*  HCT 35.0  PLT 167    Chemistries   Recent Labs Lab 11/26/14 0900 11/27/14 0449  NA 133* 135  K 3.6 3.5  CL 100* 105  CO2 23 23  GLUCOSE 176* 150*  BUN 16 13  CREATININE 0.50 0.50  CALCIUM 8.1* 7.5*  AST 15  --   ALT 16  --   ALKPHOS 96  --   BILITOT 0.7  --     Microbiology Results   Recent Results (from the past 240 hour(s))  Culture, blood (routine x 2)     Status:  None (Preliminary result)   Collection Time: 11/26/14  4:30 PM  Result Value Ref Range Status   Specimen Description BLOOD  Final   Special Requests Normal  Final   Culture NO GROWTH 2 DAYS  Final   Report Status PENDING  Incomplete  Culture, blood (routine x 2)     Status: None (Preliminary result)   Collection Time: 11/26/14  4:40 PM  Result Value Ref Range Status   Specimen Description BLOOD  Final   Special Requests Normal  Final   Culture NO GROWTH 2 DAYS  Final   Report Status PENDING  Incomplete    RADIOLOGY:  No results found.   Management plans discussed with the patient, family and they are in agreement.  CODE STATUS:     Code Status Orders        Start     Ordered   11/26/14 1758  Full code   Continuous     11/26/14 1757      TOTAL TIME TAKING CARE OF THIS PATIENT: .    Primrose Oler M.D on 11/29/2014 at 7:34 AM  Between 7am to 6pm - Pager - [phone] After 6pm go to [email or web address] - password [email or web address] [facility]  [place] [facility]  Office  [phone]  CC: Primary care physician: Cyndie Mull,  [facility]

## 2014-12-01 LAB — CULTURE, BLOOD (ROUTINE X 2)
Culture: NO GROWTH
Culture: NO GROWTH
Special Requests: NORMAL
Special Requests: NORMAL

## 2014-12-30 ENCOUNTER — Ambulatory Visit (INDEPENDENT_AMBULATORY_CARE_PROVIDER_SITE_OTHER): Payer: BLUE CROSS/BLUE SHIELD | Admitting: Internal Medicine

## 2014-12-30 ENCOUNTER — Encounter: Payer: Self-pay | Admitting: Internal Medicine

## 2014-12-30 VITALS — BP 100/62 | HR 87 | Temp 97.5°F | Ht 63.0 in | Wt 145.0 lb

## 2014-12-30 DIAGNOSIS — J189 Pneumonia, unspecified organism: Secondary | ICD-10-CM

## 2014-12-30 DIAGNOSIS — J449 Chronic obstructive pulmonary disease, unspecified: Secondary | ICD-10-CM

## 2014-12-30 DIAGNOSIS — Y95 Nosocomial condition: Principal | ICD-10-CM

## 2014-12-30 DIAGNOSIS — IMO0001 Reserved for inherently not codable concepts without codable children: Secondary | ICD-10-CM

## 2014-12-30 DIAGNOSIS — R0609 Other forms of dyspnea: Secondary | ICD-10-CM

## 2014-12-30 NOTE — Assessment & Plan Note (Signed)
Patient currently has clinical criteria for COPD (prolonged smoking history, chronic cough, productive sputum production, shortness of breath)  At this time unable to stage her level of airflow limitation per GOLD guidelines (stage I-4), will further evaluate with spirometry via PFTs.  At this time will Spiriva\adviar (500\50). Given her clinical symptoms and history base of the new GOLD guidelines for COPD she is probably either a category B or D. Currently on Qvar and as needed albuterol, patient educated on the proper use of these medications. Will adjust medications based on clinical status and primary function testing at follow-up visit. Continue Qvar and as needed albuterol

## 2014-12-30 NOTE — Assessment & Plan Note (Signed)
Recent admission in early June 2016 for suspected bilateral pneumonia as seen on chest x-ray. Does have a significant smoking history, with require a CT to further evaluate resolution of pneumonia and any underlying malignancy that could be causing pneumonia. Discharge from hospital with 3 to supplemental oxygen to be used with exertion, currently being weaned by primary care physician.  Plan: -In the resolution phase of hospital acquired pneumonia -Patient may have some deconditioning and given her underlying lung disease of COPD we'll probably take a little longer to get back to baseline -Continue supplemental oxygen as needed to maintain saturations greater 88%, currently being managed and wean by primary care physician

## 2014-12-30 NOTE — Progress Notes (Signed)
MRN# [id number] Sandy Eaton Oct 09, 1940   CC: Chief Complaint  Patient presents with  . Follow-up    Pt last ov 02/24/14. Pt had double pneumo in June2016/ she was sent home on 3L 02 qhs.      Brief History: HPI 83/6523 74 year old female past medical history significant for sinusitis, environmental allergies, seen in consultation for worsening cough x2 years and was to shortness of breath for the last 2-3 months. History per the patient and limited records. She was referred by Dr. Willeen Cass from [facility] ENT for ongoing cough and shortness of breath. Per the patient in the left premature is seen in the ED twice for coughing and shortness of breath. The first time was in July of 2015 where she had shortness of breath fever, she was treated with IV antibiotics steroids and inhalers, had a hospital stay of 4 days. In August 2015 she represented to the ED with complaint of dyspnea at that time it was noted that her sats were 85-92% she was treated with steroids and inhalers and Tessalon Perles, she was then discharged from the ED and advised to followup with ENT. He saw Dr. Willeen Cass on 02/05/2014 who optimizing acid reflux therapy, suggested that she may have sinusitis secondary to environmental allergens, he recommended RAST testing and he started her on Astelin, he also did examination of her larynx and nasal passages which were noted to be congested with clear fluid. Today the patient tells me that she had a prior smoking history of one pack per day x25 years quit in 2010, denies any illicit drug use, has pets at home (1 dog, 1 cat, both are usually outside). She states that she has a cough throughout the day most notably in the mornings, his usually productive of milky sputum. Shortness of breath is worse over the last 2 months, it is usually with exertion, she is able to go to the bathroom in the kitchen, but she cannot climb a flight of stairs without getting severely short of breath. She has been seeing a  different urgent care centers multiple times this year for shortness of breath and had been told that she may have COPD. 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  Plan: 6 minute walk test, pulmonary function testing, continue with Advair and Spiriva.   Events since last clinic visit: Patient presents today for follow-up visit of suspected COPD, deconditioning, chronic dyspnea on exertion. Since her last visit she's had a hospitalization recently for bilateral pneumonia, discharge on 11/29/2014, discharged on 3 as of supplemental oxygen as needed for O2 saturations less than 88%. Patient is accompanied by granddaughter today. She denies any worsening cough, any worsening shortness of breath, any purulent sputum production. States that she does still have a little bit of fatigue, she still working as a Holiday representative at [employer]. Patient does have 3 L of oxygen at home, but states that she does not really use it that often unless she does a lot of physical activities of daily chores. Her primary care physician is following her O2 requirement needs.     Review of Hospitalization By Dr. Dema Severin 6/10-6/13/16 73 y.o. female with a known history of COPD, previous smoker, GERD and hyperlipidemia is been feeling sick from Monday. Patient was having nausea vomiting and abdominal pain  #1 sepsis with leukocytosis and tachycardia secondary to bilateral lower lobe pneumonia with hypoxia Failed outpatient antibiotics Z-Pak IV levofloxacin for CAP---.changed  to po abxs Flu test was negative at PCPs office 2 days ago Oxygen via nasal cannula  #2 chronic history of COPD currently not in exacerbation cont prednisone for bronchoconstriction nebulizer treatments Continue singulair -pt desated to 86-87% on RA with exeriton and 92% on 3 liter [place]. Will need oxygen likely temporary at home -incentive spirometer  #3 hyperlipidemia Continue  statin  #4 constipation with generalized abdominal discomfort Laxatives CT abdomen with no acute findings  #5 PT consult for generalized weakness noted. NO PT needs at home  Medication:   Current Outpatient Rx  Name  Route  Sig  Dispense  Refill  . albuterol (PROAIR HFA) 108 (90 BASE) MCG/ACT inhaler   Inhalation   Inhale 2 puffs into the lungs every 6 (six) hours as needed for wheezing or shortness of breath.   1 Inhaler   3   . aspirin 81 MG tablet   Oral   Take 1 tablet (81 mg total) by mouth daily.   30 tablet   0   . aspirin EC 81 MG tablet   Oral   Take 81 mg by mouth daily.         Marland Kitchen. atorvastatin (LIPITOR) 40 MG tablet   Oral   Take 40 mg by mouth daily.         . beclomethasone (QVAR) 80 MCG/ACT inhaler   Inhalation   Inhale 1 puff into the lungs 2 (two) times daily.         . cetirizine (ZYRTEC) 10 MG tablet   Oral   Take 1 mg by mouth daily as needed.         . clarithromycin (BIAXIN) 500 MG tablet   Oral   Take 1 tablet by mouth 2 (two) times daily.         . diphenhydrAMINE (BENADRYL) 25 MG tablet   Oral   Take 25 mg by mouth every 6 (six) hours as needed.         . fluticasone (FLONASE) 50 MCG/ACT nasal spray   Each Nare   Place 1 spray into both nostrils daily as needed for rhinitis.         Marland Kitchen. HYDROcodone-acetaminophen (NORCO/VICODIN) 5-325 MG per tablet   Oral   Take 1 tablet by mouth every 6 (six) hours as needed for moderate pain.         . montelukast (SINGULAIR) 10 MG tablet   Oral   Take 10 mg by mouth daily.          Marland Kitchen. omeprazole (PRILOSEC) 40 MG capsule   Oral   Take 40 mg by mouth daily.            Review of Systems: Gen:  Denies  fever, sweats, chills HEENT: Denies blurred vision, double vision, ear pain, eye pain, hearing loss, nose bleeds, sore throat Cvc:  No dizziness, chest pain or heaviness Resp:   Admits to: Gi: Denies swallowing difficulty, stomach pain, nausea or vomiting, diarrhea,  constipation, bowel incontinence Gu:  Denies bladder incontinence, burning urine Ext:   No Joint pain, stiffness or swelling Skin: No skin rash, easy bruising or bleeding or hives Endoc:  No polyuria, polydipsia , polyphagia or weight change Other:  All other systems negative  Allergies:  Augmentin and Valium  Physical Examination:  VS: BP 100/62 mmHg  Pulse 87  Temp(Src) 97.5 F (36.4 C) (Oral)  Ht 5\' 3"  (1.6 m)  Wt 145 lb (65.772 kg)  BMI 25.69 kg/m2  SpO2 94%  General Appearance: No distress  HEENT: PERRLA, no ptosis, no other lesions noticed Pulmonary:normal breath sounds., diaphragmatic excursion normal.No wheezing, No rales   Cardiovascular:  Normal S1,S2.  No m/r/g.     Abdomen:Exam: Benign, Soft, non-tender, No masses  Skin:   warm, no rashes, no ecchymosis  Extremities: normal, no cyanosis, clubbing, warm with normal capillary refill.      Rad results: (The following images and results were reviewed by Dr. Dema Severin). Intent 2016 EXAM: CHEST 2 VIEW  COMPARISON: 01/31/2014  FINDINGS: There is bilateral lung opacity, most confluent in the left mid lung. Although subpleural, this opacity is not particularly wedge-shaped. There is a bandlike opacity at the right base compatible with atelectasis. Normal heart size and stable mediastinal contours, including a probable small hiatal hernia.  IMPRESSION: 1. Left-sided airspace disease consistent with pneumonia. There is also a right basilar atelectasis, which could be related to underlying airway infection. 2. Followup PA and lateral chest X-ray is recommended in 3-4 weeks following trial of antibiotic therapy to ensure resolution and exclude underlying malignancy.    Assessment and Plan: Hospital acquired PNA Recent admission in early June 2016 for suspected bilateral pneumonia as seen on chest x-ray. Does have a significant smoking history, with require a CT to further evaluate resolution of pneumonia and  any underlying malignancy that could be causing pneumonia. Discharge from hospital with 3 to supplemental oxygen to be used with exertion, currently being weaned by primary care physician.  Plan: -In the resolution phase of hospital acquired pneumonia -Patient may have some deconditioning and given her underlying lung disease of COPD we'll probably take a little longer to get back to baseline -Continue supplemental oxygen as needed to maintain saturations greater 88%, currently being managed and wean by primary care physician  DOE (dyspnea on exertion) Multifactorial: Deconditioning, COPD, recent infection, environmental allergies. Plan as stated for shortness of breath. Patient is currently on Qvar, albuterol, today is supplemental oxygen with exertion. She was educated on these medications and proper usage and indications.      COPD bronchitis Patient currently has clinical criteria for COPD (prolonged smoking history, chronic cough, productive sputum production, shortness of breath)  At this time unable to stage her level of airflow limitation per GOLD guidelines (stage I-4), will further evaluate with spirometry via PFTs.  At this time will Spiriva\adviar (500\50). Given her clinical symptoms and history base of the new GOLD guidelines for COPD she is probably either a category B or D. Currently on Qvar and as needed albuterol, patient educated on the proper use of these medications. Will adjust medications based on clinical status and primary function testing at follow-up visit. Continue Qvar and as needed albuterol    Updated Medication List Outpatient Encounter Prescriptions as of 12/30/2014  Medication Sig  . albuterol (PROAIR HFA) 108 (90 BASE) MCG/ACT inhaler Inhale 2 puffs into the lungs every 6 (six) hours as needed for wheezing or shortness of breath.  Marland Kitchen aspirin 81 MG tablet Take 1 tablet (81 mg total) by mouth daily.  Marland Kitchen aspirin EC 81 MG tablet Take 81 mg by mouth daily.   Marland Kitchen atorvastatin (LIPITOR) 40 MG tablet Take 40 mg by mouth daily.  . beclomethasone (QVAR) 80 MCG/ACT inhaler Inhale 1 puff into the lungs 2 (two) times daily.  . cetirizine (ZYRTEC) 10 MG tablet Take 1 mg by mouth daily as needed.  . clarithromycin (BIAXIN) 500 MG tablet Take 1 tablet by mouth 2 (two) times daily.  . diphenhydrAMINE (BENADRYL) 25  MG tablet Take 25 mg by mouth every 6 (six) hours as needed.  . fluticasone (FLONASE) 50 MCG/ACT nasal spray Place 1 spray into both nostrils daily as needed for rhinitis.  Marland Kitchen HYDROcodone-acetaminophen (NORCO/VICODIN) 5-325 MG per tablet Take 1 tablet by mouth every 6 (six) hours as needed for moderate pain.  . montelukast (SINGULAIR) 10 MG tablet Take 10 mg by mouth daily.   Marland Kitchen omeprazole (PRILOSEC) 40 MG capsule Take 40 mg by mouth daily.  . [DISCONTINUED] levofloxacin (LEVAQUIN) 750 MG tablet Take 1 tablet (750 mg total) by mouth daily.  . [DISCONTINUED] predniSONE (DELTASONE) 10 MG tablet Label and dispense accordingly Day 1 take 40 mg Day 2 take 30 mg Day 3 take 20 mg Day 4 take 10 mg then stop   No facility-administered encounter medications on file as of 12/30/2014.    Orders for this visit: Orders Placed This Encounter  Procedures  . CT Chest W Contrast    Standing Status: Future     Number of Occurrences:      Standing Expiration Date: 03/01/2016    Scheduling Instructions:     Schedule close to 3 mos    Order Specific Question:  Reason for Exam (SYMPTOM  OR DIAGNOSIS REQUIRED)    Answer:  pneumonia    Order Specific Question:  Preferred imaging location?    Answer:  [facility]  . Pulmonary function test    Standing Status: Future     Number of Occurrences:      Standing Expiration Date: 12/30/2015    Order Specific Question:  Where should this test be performed?    Answer:  [facility]    Order Specific Question:  Full PFT: includes the following: basic spirometry, spirometry pre & post bronchodilator, diffusion  capacity (DLCO), lung volumes    Answer:  Full PFT    Order Specific Question:  MIP/MEP    Answer:  No    Order Specific Question:  6 minute walk    Answer:  Yes    Order Specific Question:  ABG    Answer:  No    Order Specific Question:  Diffusion capacity (DLCO)    Answer:  No    Order Specific Question:  Lung volumes    Answer:  No    Order Specific Question:  Methacholine challenge    Answer:  No    Thank  you for the visitation and for allowing  [facility] to assist in the care of your patient. Our recommendations are noted above.  Please contact us if we can be of further service.  Stephanie Acre, MD [facility] Office Number: [phone]

## 2014-12-30 NOTE — Assessment & Plan Note (Addendum)
Multifactorial: Deconditioning, COPD, recent infection, environmental allergies. Plan as stated for shortness of breath. Patient is currently on Qvar, albuterol, today is supplemental oxygen with exertion. She was educated on these medications and proper usage and indications.

## 2014-12-30 NOTE — Patient Instructions (Signed)
Follow up with Dr. Dema SeverinMungal in 3 months.  - Continue Qvar as directed, and as needed albuterol -Continue with supplemental oxygen 3 L as needed to maintain saturations greater and 88%, follow-up with her primary care physician for walk testing and determination of O2 needs. -Avoid any forms of tobacco including vapors, electronic cigarettes, wood-burning etc. -Pulmonary function testing and 6 minute walk test prior to follow-up visit -CT scan chest with contrast prior to follow-up visit

## 2014-12-30 NOTE — Addendum Note (Signed)
Addended by: Alease FrameARTER, Gilliam Hawkes S on: 12/30/2014 04:38 PM   Modules accepted: Orders

## 2015-01-05 ENCOUNTER — Telehealth: Payer: Self-pay | Admitting: *Deleted

## 2015-01-05 NOTE — Telephone Encounter (Signed)
Pt wanted to know is she was to have CT scan earlier than Oct. Pt informed that as long as she gets CT scan before next f/u appt in 3 mos. Nothing further needed.

## 2015-01-07 ENCOUNTER — Ambulatory Visit
Admission: RE | Admit: 2015-01-07 | Discharge: 2015-01-07 | Disposition: A | Discharge status: Home / Self Care | Payer: BLUE CROSS/BLUE SHIELD | Source: Ambulatory Visit | Attending: Family Medicine | Admitting: Family Medicine

## 2015-01-26 ENCOUNTER — Other Ambulatory Visit: Payer: Self-pay | Admitting: Family Medicine

## 2015-01-26 DIAGNOSIS — Z13820 Encounter for screening for osteoporosis: Secondary | ICD-10-CM

## 2015-04-05 ENCOUNTER — Ambulatory Visit: Payer: BLUE CROSS/BLUE SHIELD

## 2015-04-06 ENCOUNTER — Ambulatory Visit
Admission: RE | Admit: 2015-04-06 | Discharge: 2015-04-06 | Disposition: A | Discharge status: Home / Self Care | Payer: BLUE CROSS/BLUE SHIELD | Source: Ambulatory Visit | Attending: Internal Medicine | Admitting: Internal Medicine

## 2015-04-06 DIAGNOSIS — Y95 Nosocomial condition: Secondary | ICD-10-CM

## 2015-04-06 DIAGNOSIS — R0609 Other forms of dyspnea: Secondary | ICD-10-CM

## 2015-04-06 DIAGNOSIS — J439 Emphysema, unspecified: Secondary | ICD-10-CM | POA: Diagnosis not present

## 2015-04-06 DIAGNOSIS — J189 Pneumonia, unspecified organism: Secondary | ICD-10-CM | POA: Insufficient documentation

## 2015-04-06 DIAGNOSIS — IMO0001 Reserved for inherently not codable concepts without codable children: Secondary | ICD-10-CM

## 2015-04-06 MED ORDER — IOHEXOL 300 MG/ML  SOLN
75.0000 mL | Freq: Once | INTRAMUSCULAR | Status: AC | PRN
  Administered 2015-04-06: 75 mL via INTRAVENOUS

## 2015-04-18 ENCOUNTER — Telehealth: Payer: Self-pay | Admitting: Internal Medicine

## 2015-04-18 NOTE — Telephone Encounter (Signed)
Patient with great improvement in her Chest CT: Resolution of previously seen pneumonia. No evidence of residual or recurrent pneumonia. No evidence of underlying mass lesion. Mild scarring from recent pneumonia   Further details to be discussed at follow up visit.   Stephanie AcreVishal Trelon Plush, MD [facility]

## 2015-04-18 NOTE — Telephone Encounter (Signed)
lmtcb for pt.   Dr. Dema SeverinMungal please advise on pt's CT results from 10.19.16. Thanks.

## 2015-04-19 ENCOUNTER — Telehealth: Payer: Self-pay | Admitting: Internal Medicine

## 2015-04-19 NOTE — Telephone Encounter (Signed)
Called and spoke to pt. Pt is requesting CT chest results. Informed her of the results and recs per Dr. Dema SeverinMungal. Pt verbalized understanding and denied any further questions or concerns at this time.    Stephanie AcreVishal Mungal, MD at 04/18/2015 1:07 PM     Status: Signed       Expand All Collapse All   Patient with great improvement in her Chest CT: Resolution of previously seen pneumonia. No evidence of residual or recurrent pneumonia. No evidence of underlying mass lesion. Mild scarring from recent pneumonia   Further details to be discussed at follow up visit.   Stephanie AcreVishal Mungal, MD [facility]

## 2015-04-20 NOTE — Telephone Encounter (Signed)
lmtcb

## 2015-04-21 NOTE — Telephone Encounter (Signed)
LMTCB for pt 

## 2015-04-22 NOTE — Telephone Encounter (Signed)
lmtcb x3 

## 2015-04-26 NOTE — Telephone Encounter (Signed)
Pt informed and f/u appt made. Nothing further needed.

## 2015-05-05 ENCOUNTER — Emergency Department: Payer: BLUE CROSS/BLUE SHIELD

## 2015-05-05 ENCOUNTER — Encounter: Payer: Self-pay | Admitting: Emergency Medicine

## 2015-05-05 ENCOUNTER — Inpatient Hospital Stay
Admission: EM | Admit: 2015-05-05 | Discharge: 2015-05-06 | DRG: 193 | Disposition: A | Discharge status: Home Health | Payer: BLUE CROSS/BLUE SHIELD | Attending: Internal Medicine | Admitting: Internal Medicine

## 2015-05-05 DIAGNOSIS — J189 Pneumonia, unspecified organism: Secondary | ICD-10-CM | POA: Diagnosis present

## 2015-05-05 DIAGNOSIS — E785 Hyperlipidemia, unspecified: Secondary | ICD-10-CM | POA: Diagnosis present

## 2015-05-05 DIAGNOSIS — R109 Unspecified abdominal pain: Secondary | ICD-10-CM

## 2015-05-05 DIAGNOSIS — J690 Pneumonitis due to inhalation of food and vomit: Secondary | ICD-10-CM

## 2015-05-05 DIAGNOSIS — Z87891 Personal history of nicotine dependence: Secondary | ICD-10-CM

## 2015-05-05 DIAGNOSIS — Z888 Allergy status to other drugs, medicaments and biological substances status: Secondary | ICD-10-CM | POA: Diagnosis not present

## 2015-05-05 DIAGNOSIS — Z880 Allergy status to penicillin: Secondary | ICD-10-CM

## 2015-05-05 DIAGNOSIS — Z79891 Long term (current) use of opiate analgesic: Secondary | ICD-10-CM

## 2015-05-05 DIAGNOSIS — M810 Age-related osteoporosis without current pathological fracture: Secondary | ICD-10-CM | POA: Diagnosis present

## 2015-05-05 DIAGNOSIS — Z23 Encounter for immunization: Secondary | ICD-10-CM

## 2015-05-05 DIAGNOSIS — J9620 Acute and chronic respiratory failure, unspecified whether with hypoxia or hypercapnia: Secondary | ICD-10-CM | POA: Diagnosis present

## 2015-05-05 DIAGNOSIS — Z881 Allergy status to other antibiotic agents status: Secondary | ICD-10-CM

## 2015-05-05 DIAGNOSIS — K219 Gastro-esophageal reflux disease without esophagitis: Secondary | ICD-10-CM | POA: Diagnosis present

## 2015-05-05 DIAGNOSIS — J45909 Unspecified asthma, uncomplicated: Secondary | ICD-10-CM | POA: Diagnosis present

## 2015-05-05 DIAGNOSIS — J449 Chronic obstructive pulmonary disease, unspecified: Secondary | ICD-10-CM | POA: Diagnosis present

## 2015-05-05 DIAGNOSIS — Z79899 Other long term (current) drug therapy: Secondary | ICD-10-CM | POA: Diagnosis not present

## 2015-05-05 DIAGNOSIS — Z7982 Long term (current) use of aspirin: Secondary | ICD-10-CM

## 2015-05-05 LAB — URINALYSIS COMPLETE WITH MICROSCOPIC (ARMC ONLY)
BILIRUBIN URINE: NEGATIVE
Bacteria, UA: NONE SEEN
Glucose, UA: NEGATIVE mg/dL
Hgb urine dipstick: NEGATIVE
KETONES UR: NEGATIVE mg/dL
Leukocytes, UA: NEGATIVE
Nitrite: NEGATIVE
PROTEIN: NEGATIVE mg/dL
RBC / HPF: NONE SEEN RBC/hpf (ref 0–5)
Specific Gravity, Urine: 1.004 — ABNORMAL LOW (ref 1.005–1.030)
pH: 6 (ref 5.0–8.0)

## 2015-05-05 LAB — CBC WITH DIFFERENTIAL/PLATELET
BASOS PCT: 2 %
Basophils Absolute: 0.2 10*3/uL — ABNORMAL HIGH (ref 0–0.1)
EOS PCT: 28 %
Eosinophils Absolute: 2.5 10*3/uL — ABNORMAL HIGH (ref 0–0.7)
HCT: 40.9 % (ref 35.0–47.0)
Hemoglobin: 13.7 g/dL (ref 12.0–16.0)
LYMPHS ABS: 1.4 10*3/uL (ref 1.0–3.6)
Lymphocytes Relative: 16 %
MCH: 31.3 pg (ref 26.0–34.0)
MCHC: 33.7 g/dL (ref 32.0–36.0)
MCV: 93.1 fL (ref 80.0–100.0)
MONO ABS: 0.8 10*3/uL (ref 0.2–0.9)
Monocytes Relative: 9 %
NEUTROS ABS: 4 10*3/uL (ref 1.4–6.5)
Neutrophils Relative %: 45 %
PLATELETS: 183 10*3/uL (ref 150–440)
RBC: 4.39 MIL/uL (ref 3.80–5.20)
RDW: 12.2 % (ref 11.5–14.5)
WBC: 8.9 10*3/uL (ref 3.6–11.0)

## 2015-05-05 LAB — COMPREHENSIVE METABOLIC PANEL
ALT: 20 U/L (ref 14–54)
ANION GAP: 8 (ref 5–15)
AST: 23 U/L (ref 15–41)
Albumin: 4 g/dL (ref 3.5–5.0)
Alkaline Phosphatase: 154 U/L — ABNORMAL HIGH (ref 38–126)
BILIRUBIN TOTAL: 0.7 mg/dL (ref 0.3–1.2)
BUN: 11 mg/dL (ref 6–20)
CO2: 25 mmol/L (ref 22–32)
Calcium: 9.1 mg/dL (ref 8.9–10.3)
Chloride: 105 mmol/L (ref 101–111)
Creatinine, Ser: 0.52 mg/dL (ref 0.44–1.00)
GFR calc Af Amer: >60 mL/min (ref >60)
GFR calc non Af Amer: >60 mL/min (ref >60)
GLUCOSE: 143 mg/dL — AB (ref 65–99)
Potassium: 3.8 mmol/L (ref 3.5–5.1)
Sodium: 138 mmol/L (ref 135–145)
TOTAL PROTEIN: 7.1 g/dL (ref 6.5–8.1)

## 2015-05-05 LAB — LIPASE, BLOOD: Lipase: 32 U/L (ref 11–51)

## 2015-05-05 LAB — BRAIN NATRIURETIC PEPTIDE: B Natriuretic Peptide: 9 pg/mL (ref 0.0–100.0)

## 2015-05-05 LAB — TROPONIN I

## 2015-05-05 MED ORDER — LEVOFLOXACIN IN D5W 500 MG/100ML IV SOLN
500.0000 mg | INTRAVENOUS | Status: DC
  Administered 2015-05-05: 500 mg via INTRAVENOUS
  Filled 2015-05-05 (×2): qty 100

## 2015-05-05 MED ORDER — DEXTROSE 5 % IV SOLN
500.0000 mg | Freq: Once | INTRAVENOUS | Status: DC
  Filled 2015-05-05: qty 500

## 2015-05-05 MED ORDER — SODIUM CHLORIDE 0.9 % IV BOLUS (SEPSIS)
1000.0000 mL | Freq: Once | INTRAVENOUS | Status: AC
  Administered 2015-05-05: 1000 mL via INTRAVENOUS

## 2015-05-05 MED ORDER — FLUTICASONE PROPIONATE 50 MCG/ACT NA SUSP
1.0000 | Freq: Two times a day (BID) | NASAL | Status: DC
  Administered 2015-05-05 – 2015-05-06 (×3): 1 via NASAL
  Filled 2015-05-05: qty 16

## 2015-05-05 MED ORDER — ASPIRIN EC 81 MG PO TBEC
81.0000 mg | DELAYED_RELEASE_TABLET | Freq: Every day | ORAL | Status: DC
  Administered 2015-05-05 – 2015-05-06 (×2): 81 mg via ORAL
  Filled 2015-05-05 (×3): qty 1

## 2015-05-05 MED ORDER — ACETAMINOPHEN 650 MG RE SUPP: 650.0000 mg | Freq: Four times a day (QID) | RECTAL | Status: DC | PRN

## 2015-05-05 MED ORDER — ACETAMINOPHEN 325 MG PO TABS
650.0000 mg | ORAL_TABLET | Freq: Four times a day (QID) | ORAL | Status: DC | PRN
  Administered 2015-05-05: 650 mg via ORAL
  Filled 2015-05-05: qty 2

## 2015-05-05 MED ORDER — ALUM & MAG HYDROXIDE-SIMETH 200-200-20 MG/5ML PO SUSP: 30.0000 mL | Freq: Four times a day (QID) | ORAL | Status: DC | PRN

## 2015-05-05 MED ORDER — INFLUENZA VAC SPLIT QUAD 0.5 ML IM SUSY
0.5000 mL | PREFILLED_SYRINGE | INTRAMUSCULAR | Status: AC
  Administered 2015-05-06: 0.5 mL via INTRAMUSCULAR
  Filled 2015-05-05: qty 0.5

## 2015-05-05 MED ORDER — OXYCODONE-ACETAMINOPHEN 5-325 MG PO TABS
1.0000 | ORAL_TABLET | Freq: Four times a day (QID) | ORAL | Status: DC | PRN
  Administered 2015-05-05: 1 via ORAL
  Administered 2015-05-05: 2 via ORAL
  Administered 2015-05-06: 1 via ORAL
  Filled 2015-05-05 (×2): qty 1

## 2015-05-05 MED ORDER — CEFTRIAXONE SODIUM 1 G IJ SOLR
1.0000 g | Freq: Once | INTRAMUSCULAR | Status: AC
  Administered 2015-05-05: 1 g via INTRAVENOUS
  Filled 2015-05-05: qty 10

## 2015-05-05 MED ORDER — SENNOSIDES-DOCUSATE SODIUM 8.6-50 MG PO TABS: 1.0000 | ORAL_TABLET | Freq: Every evening | ORAL | Status: DC | PRN

## 2015-05-05 MED ORDER — LORATADINE 10 MG PO TABS
10.0000 mg | ORAL_TABLET | Freq: Every day | ORAL | Status: DC
  Administered 2015-05-05 – 2015-05-06 (×2): 10 mg via ORAL
  Filled 2015-05-05 (×2): qty 1

## 2015-05-05 MED ORDER — ONDANSETRON HCL 4 MG/2ML IJ SOLN
4.0000 mg | Freq: Four times a day (QID) | INTRAMUSCULAR | Status: DC | PRN
  Administered 2015-05-06: 13:00:00 4 mg via INTRAVENOUS
  Filled 2015-05-05: qty 2

## 2015-05-05 MED ORDER — ENOXAPARIN SODIUM 40 MG/0.4ML ~~LOC~~ SOLN
40.0000 mg | SUBCUTANEOUS | Status: DC
  Administered 2015-05-05: 40 mg via SUBCUTANEOUS
  Filled 2015-05-05 (×2): qty 0.4

## 2015-05-05 MED ORDER — METHYLPREDNISOLONE SODIUM SUCC 125 MG IJ SOLR
125.0000 mg | Freq: Once | INTRAMUSCULAR | Status: AC
  Administered 2015-05-05: 125 mg via INTRAVENOUS
  Filled 2015-05-05: qty 2

## 2015-05-05 MED ORDER — OXYCODONE-ACETAMINOPHEN 5-325 MG PO TABS
ORAL_TABLET | ORAL | Status: AC
  Administered 2015-05-05: 15:00:00
  Filled 2015-05-05: qty 2

## 2015-05-05 MED ORDER — MONTELUKAST SODIUM 10 MG PO TABS
10.0000 mg | ORAL_TABLET | ORAL | Status: DC
  Administered 2015-05-05 – 2015-05-06 (×2): 10 mg via ORAL
  Filled 2015-05-05 (×2): qty 1

## 2015-05-05 MED ORDER — BISACODYL 5 MG PO TBEC: 5.0000 mg | DELAYED_RELEASE_TABLET | Freq: Every day | ORAL | Status: DC | PRN

## 2015-05-05 MED ORDER — METHYLPREDNISOLONE SODIUM SUCC 125 MG IJ SOLR
60.0000 mg | INTRAMUSCULAR | Status: DC
  Administered 2015-05-05 – 2015-05-06 (×2): 60 mg via INTRAVENOUS
  Filled 2015-05-05 (×2): qty 2

## 2015-05-05 MED ORDER — IPRATROPIUM-ALBUTEROL 0.5-2.5 (3) MG/3ML IN SOLN
3.0000 mL | Freq: Four times a day (QID) | RESPIRATORY_TRACT | Status: DC
  Administered 2015-05-05 – 2015-05-06 (×5): 3 mL via RESPIRATORY_TRACT
  Filled 2015-05-05 (×5): qty 3

## 2015-05-05 MED ORDER — ATORVASTATIN CALCIUM 20 MG PO TABS
40.0000 mg | ORAL_TABLET | Freq: Every day | ORAL | Status: DC
  Administered 2015-05-05: 21:00:00 40 mg via ORAL
  Filled 2015-05-05: qty 2

## 2015-05-05 MED ORDER — ALBUTEROL SULFATE (2.5 MG/3ML) 0.083% IN NEBU
5.0000 mg | INHALATION_SOLUTION | Freq: Once | RESPIRATORY_TRACT | Status: AC
  Administered 2015-05-05: 5 mg via RESPIRATORY_TRACT
  Filled 2015-05-05: qty 6

## 2015-05-05 MED ORDER — PANTOPRAZOLE SODIUM 40 MG PO TBEC
40.0000 mg | DELAYED_RELEASE_TABLET | Freq: Every day | ORAL | Status: DC
  Administered 2015-05-05 – 2015-05-06 (×2): 40 mg via ORAL
  Filled 2015-05-05 (×2): qty 1

## 2015-05-05 MED ORDER — BENZONATATE 100 MG PO CAPS
100.0000 mg | ORAL_CAPSULE | Freq: Three times a day (TID) | ORAL | Status: DC | PRN
  Administered 2015-05-05 – 2015-05-06 (×4): 100 mg via ORAL
  Filled 2015-05-05 (×4): qty 1

## 2015-05-05 MED ORDER — ONDANSETRON HCL 4 MG PO TABS: 4.0000 mg | ORAL_TABLET | Freq: Four times a day (QID) | ORAL | Status: DC | PRN

## 2015-05-05 NOTE — ED Provider Notes (Signed)
[facility] Emergency Department Provider Note  ____________________________________________   I have reviewed the triage vital signs and the nursing notes.   HISTORY  Chief Complaint Shortness of Breath    HPI Sandy Eaton is a 74 y.o. female with a history of COPD, who stopped smoking 6 years ago, as well as pneumonia in the past, on 2-3 L home oxygen normally at night, presents today with cough and shortness of breath which is much worse over the last week. She saw her primary care doctor was told that she had a "cold" but she feels that she is having a pneumonia. It hurts when she coughs particularly in the back and the left side of her chest wall. He feels "sore". She does not have exertional chest pain. She is using her home inhalers with minimal results that she feels and cough and wheeze over the last few days.  Past Medical History  Diagnosis Date  . Environmental allergies   . COPD (chronic obstructive pulmonary disease) (HCC)   . GERD (gastroesophageal reflux disease)   . Asthma   . Hyperlipidemia     Patient Active Problem List   Diagnosis Date Noted  . Hospital acquired PNA 11/26/2014  . SOB (shortness of breath) 02/24/2014  . DOE (dyspnea on exertion) 02/24/2014  . Cough 02/24/2014  . COPD bronchitis 02/24/2014    Past Surgical History  Procedure Laterality Date  . Vaginal hysterectomy      1960's  . Rotator cuff repair Right     Current Outpatient Rx  Name  Route  Sig  Dispense  Refill  . albuterol (PROAIR HFA) 108 (90 BASE) MCG/ACT inhaler   Inhalation   Inhale 2 puffs into the lungs every 6 (six) hours as needed for wheezing or shortness of breath.   1 Inhaler   3   . aspirin 81 MG tablet   Oral   Take 1 tablet (81 mg total) by mouth daily.   30 tablet   0   . aspirin EC 81 MG tablet   Oral   Take 81 mg by mouth daily.         Marland Kitchen. atorvastatin (LIPITOR) 40 MG tablet   Oral   Take 40 mg by mouth daily.          . beclomethasone (QVAR) 80 MCG/ACT inhaler   Inhalation   Inhale 1 puff into the lungs 2 (two) times daily.         . cetirizine (ZYRTEC) 10 MG tablet   Oral   Take 1 mg by mouth daily as needed.         . diphenhydrAMINE (BENADRYL) 25 MG tablet   Oral   Take 25 mg by mouth every 6 (six) hours as needed.         . fluticasone (FLONASE) 50 MCG/ACT nasal spray   Each Nare   Place 1 spray into both nostrils daily as needed for rhinitis.         Marland Kitchen. HYDROcodone-acetaminophen (NORCO/VICODIN) 5-325 MG per tablet   Oral   Take 1 tablet by mouth every 6 (six) hours as needed for moderate pain.         . montelukast (SINGULAIR) 10 MG tablet   Oral   Take 10 mg by mouth daily.          Marland Kitchen. omeprazole (PRILOSEC) 40 MG capsule   Oral   Take 40 mg by mouth daily.  Allergies Augmentin and Valium  Family History  Problem Relation Age of Onset  . COPD Father   . Asthma Daughter   . Asthma Grandchild   . Cancer Daughter     breast    Social History Social History  Substance Use Topics  . Smoking status: Former Smoker -- 1.00 packs/day for 30 years    Types: Cigarettes    Quit date: 02/24/2009  . Smokeless tobacco: Never Used  . Alcohol Use: No    Review of Systems Constitutional: No fever/chills Eyes: No visual changes. ENT: No sore throat. No stiff neck no neck pain Cardiovascular: See history of present illness Respiratory: shortness of breath. Gastrointestinal:   no vomiting.  No diarrhea.  No constipation. Genitourinary: Negative for dysuria. Musculoskeletal: Negative lower extremity swelling Skin: Negative for rash. Neurological: Negative for headaches, focal weakness or numbness. 10-point ROS otherwise negative.  ____________________________________________   PHYSICAL EXAM:  VITAL SIGNS: ED Triage Vitals  Enc Vitals Group     BP 05/05/15 0750 115/70 mmHg     Pulse Rate 05/05/15 0750 104     Resp 05/05/15 0750 22     Temp 05/05/15  0750 97.7 F (36.5 C)     Temp Source 05/05/15 0750 Oral     SpO2 05/05/15 0750 90 %     Weight 05/05/15 0750 140 lb (63.504 kg)     Height 05/05/15 0750  (1.6 m)     Head Cir --      Peak Flow --      Pain Score 05/05/15 0751 8     Pain Loc --      Pain Edu? --      Excl. in GC? --     Constitutional: Alert and oriented. Appears anxious but not toxic Eyes: Conjunctivae are normal. PERRL. EOMI. Head: Atraumatic. Nose: No congestion/rhinnorhea. Mouth/Throat: Mucous membranes are moist.  Oropharynx non-erythematous. Neck: No stridor.   Nontender with no meningismus Cardiovascular: Normal rate, regular rhythm. Grossly normal heart sounds.  Good peripheral circulation. Chest: Tender to palpation along the left ribs around towards the back with no shingles or flail chest or crepitus noted. Consistent with muscular skeletal pain Respiratory: She is able speak in full senses but she appears mildly winded when she doesn't, she has diffuse wheeze and occasional bibasilar rhonchi Abdominal: Soft and nontender. No distention. No guarding no rebound Back:  There is no focal tenderness or step off there is no midline tenderness there are no lesions noted. there is no CVA tenderness Musculoskeletal: No lower extremity tenderness. No joint effusions, no DVT signs strong distal pulses no edema Neurologic:  Normal speech and language. No gross focal neurologic deficits are appreciated.  Skin:  Skin is warm, dry and intact. No rash noted. Psychiatric: Mood and affect are normal. Speech and behavior are normal.  ____________________________________________   LABS (all labs ordered are listed, but only abnormal results are displayed)  Labs Reviewed  URINE CULTURE  TROPONIN I  COMPREHENSIVE METABOLIC PANEL  LIPASE, BLOOD  BRAIN NATRIURETIC PEPTIDE  CBC WITH DIFFERENTIAL/PLATELET  URINALYSIS COMPLETEWITH MICROSCOPIC ([facility] ONLY)   ____________________________________________  EKG  I  personally interpreted any EKGs ordered by me or triage  ____________________________________________  RADIOLOGY  I reviewed any imaging ordered by me or triage that were performed during my shift ____________________________________________   PROCEDURES  Procedure(s) performed: None  Critical Care performed: None  ____________________________________________   INITIAL IMPRESSION / ASSESSMENT AND PLAN / ED COURSE  Pertinent labs & imaging results  that were available during my care of the patient were reviewed by me and considered in my medical decision making (see chart for details).  Patient with a history of COPD presents with cough and wheeze and reproduced with chest wall pain, we will check a troponin and EKG to ensure that that is not more significant chest pain, we will get a chest x-ray start her on steroids give her albuterol and reassess. ____________________________________________   FINAL CLINICAL IMPRESSION(S) / ED DIAGNOSES  Final diagnoses:  None     Jeanmarie Plant, MD 05/05/15 [phone]

## 2015-05-05 NOTE — Consult Note (Signed)
ANTIBIOTIC CONSULT NOTE - INITIAL  Pharmacy Consult for levofloxacin Indication: pneumonia  Allergies  Allergen Reactions  . Augmentin [Amoxicillin-Pot Clavulanate] Nausea And Vomiting  . Valium [Diazepam] Other (See Comments)    Reaction:  Hallucinations     Patient Measurements: Height: 5\' 3"  (160 cm) Weight: 140 lb (63.504 kg) IBW/kg (Calculated) : 52.4 Adjusted Body Weight:   Vital Signs: Temp: 97.7 F (36.5 C) (11/17 0750) Temp Source: Oral (11/17 0750) BP: 120/79 mmHg (11/17 0814) Pulse Rate: 95 (11/17 0814) Intake/Output from previous day:   Intake/Output from this shift:    Labs:  Recent Labs  05/05/15 0817  WBC 8.9  HGB 13.7  PLT 183  CREATININE 0.52   Estimated Creatinine Clearance: 55.3 mL/min (by C-G formula based on Cr of 0.52). No results for input(s): VANCOTROUGH, VANCOPEAK, VANCORANDOM, GENTTROUGH, GENTPEAK, GENTRANDOM, TOBRATROUGH, TOBRAPEAK, TOBRARND, AMIKACINPEAK, AMIKACINTROU, AMIKACIN in the last 72 hours.   Microbiology: No results found for this or any previous visit (from the past 720 hour(s)).  Medical History: Past Medical History  Diagnosis Date  . Environmental allergies   . COPD (chronic obstructive pulmonary disease) (HCC)   . GERD (gastroesophageal reflux disease)   . Asthma   . Hyperlipidemia     Medications:  Scheduled:  . aspirin EC  81 mg Oral Daily  . atorvastatin  40 mg Oral QHS  . enoxaparin (LOVENOX) injection  40 mg Subcutaneous Q24H  . fluticasone  1 spray Each Nare BID  . ipratropium-albuterol  3 mL Nebulization Q6H  . loratadine  10 mg Oral Daily  . methylPREDNISolone (SOLU-MEDROL) injection  60 mg Intravenous Q24H  . montelukast  10 mg Oral BH-q7a  . pantoprazole  40 mg Oral Daily   Assessment: Pt is a 74 year old female with cough and sob, chest pain with cough. Chest x-ray shows new lower lobe airspace disease. Pharmacy consulted to dose levofloxacin.  Goal of Therapy:  resolution of  infection  Plan:  Follow up culture results levofloxacin IV 500mg  q 24 hours. pharmacy to continue to monitor renal function for any dose adjustments  Raechal Raben D Aleem Elza 05/05/2015,9:39 AM

## 2015-05-05 NOTE — H&P (Signed)
[facility] [place] [facility]   PATIENT NAME: Sandy Eaton    MR#:  [id number]  DATE OF BIRTH:  1940-11-03  DATE OF ADMISSION:  05/05/2015  PRIMARY CARE PHYSICIAN: Cyndie Mull, DO   REQUESTING/REFERRING PHYSICIAN:   CHIEF COMPLAINT: Shortness of breath    Chief Complaint  Patient presents with  . Shortness of Breath    HISTORY OF PRESENT ILLNESS:  Sandy Eaton  is a 74 y.o. female with a known history of COPD on nocturnal oxygen comes in with SOB for 2 weeks.also had wheezing,cough,left side pleuritic chest pain,tried inhalers at home with out relief.chest xray showed left side pneumonia.. Ad of breath. Having shortness of breath ear-old female patient with history of COPD, nocturnal oxygen comes in because of shortness of breath for 2 weeks associated with cough, left-sided pleuritic chest pain. Patient tried inhalers at home without any relief. Continued to require oxygen during the daytime also.year-old female patient with history of COPD, nocturnal oxygen comes in because of shortness of breath for 2 weeks associated with cough, left-sided pleuritic chest pain. Patient tried inhalers at home without any relief. Continued to require oxygen during the daytime also.  PAST MEDICAL HISTORY:   Past Medical History  Diagnosis Date  . Environmental allergies   . COPD (chronic obstructive pulmonary disease) (HCC)   . GERD (gastroesophageal reflux disease)   . Asthma   . Hyperlipidemia     PAST SURGICAL HISTOIRY:   Past Surgical History  Procedure Laterality Date  . Vaginal hysterectomy      1960's  . Rotator cuff repair Right     SOCIAL HISTORY:   Social History  Substance Use Topics  . Smoking status: Former Smoker -- 1.00 packs/day for 30 years    Types: Cigarettes    Quit date: 02/24/2009  . Smokeless tobacco: Never Used  . Alcohol Use: No    FAMILY HISTORY:   Family History  Problem Relation Age of Onset  . COPD  Father   . Asthma Daughter   . Asthma Grandchild   . Cancer Daughter     breast    DRUG ALLERGIES:   Allergies  Allergen Reactions  . Augmentin [Amoxicillin-Pot Clavulanate] Nausea And Vomiting  . Valium [Diazepam] Other (See Comments)    Reaction:  Hallucinations     REVIEW OF SYSTEMS:  CONSTITUTIONAL: No fever, fatigue or weakness.  EYES: No blurred or double vision.  EARS, NOSE, AND THROAT: No tinnitus or ear pain.  RESPIRATORY: Cough, shortness of breath and left-sided pleuritic chest pain for 2 weeks. CARDIOVASCULAR: No chest pain, orthopnea, edema.  GASTROINTESTINAL: No nausea, vomiting, diarrhea or abdominal pain.  GENITOURINARY: No dysuria, hematuria.  ENDOCRINE: No polyuria, nocturia,  HEMATOLOGY: No anemia, easy bruising or bleeding SKIN: No rash or lesion. MUSCULOSKELETAL: No joint pain or arthritis.   NEUROLOGIC: No tingling, numbness, weakness.  PSYCHIATRY: No anxiety or depression.   MEDICATIONS AT HOME:   Prior to Admission medications   Medication Sig Start Date End Date Taking? Authorizing Provider  albuterol (PROAIR HFA) 108 (90 BASE) MCG/ACT inhaler Inhale 2 puffs into the lungs every 6 (six) hours as needed for wheezing or shortness of breath. 03/11/14  Yes Vishal Mungal, MD  aspirin EC 81 MG tablet Take 81 mg by mouth daily.   Yes Historical Provider, MD  atorvastatin (LIPITOR) 40 MG tablet Take 40 mg by mouth at bedtime.    Yes Historical Provider, MD  beclomethasone (QVAR) 80 MCG/ACT inhaler Inhale 2  puffs into the lungs 2 (two) times daily.    Yes Historical Provider, MD  benzonatate (TESSALON) 100 MG capsule Take 100 mg by mouth 3 (three) times daily as needed for cough.   Yes Historical Provider, MD  cetirizine (ZYRTEC) 10 MG tablet Take 10 mg by mouth daily as needed for allergies.   Yes Historical Provider, MD  fluticasone (FLONASE) 50 MCG/ACT nasal spray Place 1 spray into both nostrils 2 (two) times daily.    Yes Historical Provider, MD   montelukast (SINGULAIR) 10 MG tablet Take 10 mg by mouth every morning.    Yes Historical Provider, MD  omeprazole (PRILOSEC) 40 MG capsule Take 40 mg by mouth daily.   Yes Historical Provider, MD      VITAL SIGNS:  Blood pressure 115/72, pulse 93, temperature 98.7 F (37.1 C), temperature source Oral, resp. rate 20, height  (1.6 m), weight 63.504 kg (140 lb), SpO2 95 %.  PHYSICAL EXAMINATION:  GENERAL:  74 y.o.-year-old patient lying in the bed with no acute distress.  EYES: Pupils equal, round, reactive to light and accommodation. No scleral icterus. Extraocular muscles intact.  HEENT: Head atraumatic, normocephalic. Oropharynx and nasopharynx clear.  NECK:  Supple, no jugular venous distention. No thyroid enlargement, no tenderness.  LUNGS:mild expiratory wheeze in left base.other wise clear to auscultation.   MCARDIOVASCULAR: S1, S2 normal. No murmurs, rubs, or gallops.  ABDOMEN: Soft, nontender, nondistended. Bowel sounds present. No organomegaly or mass.  EXTREMITIES: No pedal edema, cyanosis, or clubbing.  NEUROLOGIC: Cranial nerves II through XII are intact. Muscle strength 5/5 in all extremities. Sensation intact. Gait not checked.  PSYCHIATRIC: The patient is alert and oriented x 3.  SKIN: No obvious rash, lesion, or ulcer.   LABORATORY PANEL:   CBC  Recent Labs Lab 05/05/15 0817  WBC 8.9  HGB 13.7  HCT 40.9  PLT 183   ------------------------------------------------------------------------------------------------------------------  Chemistries   Recent Labs Lab 05/05/15 0817  NA 138  K 3.8  CL 105  CO2 25  GLUCOSE 143*  BUN 11  CREATININE 0.52  CALCIUM 9.1  AST 23  ALT 20  ALKPHOS 154*  BILITOT 0.7   ------------------------------------------------------------------------------------------------------------------  Cardiac Enzymes  Recent Labs Lab 05/05/15 0817  TROPONINI <0.03    ------------------------------------------------------------------------------------------------------------------  RADIOLOGY:  Dg Chest 2 View  05/05/2015  CLINICAL DATA:  Cough EXAM: CHEST  2 VIEW COMPARISON:  11/26/2014 FINDINGS: Left upper lobe airspace disease has resolved. Airspace disease at the right base has resolved. Airspace disease in the posterior basal segment of the left lower lobe has increased. No pneumothorax. No pleural effusion. IMPRESSION: Resolved right lower lobe and left upper lobe airspace disease. New left lower lobe airspace disease. Followup PA and lateral chest X-ray is recommended in 3-4 weeks following trial of antibiotic therapy to ensure resolution and exclude underlying malignancy. Electronically Signed   By: Jolaine Click M.D.   On: 05/05/2015 09:02    EKG:   Orders placed or performed during the hospital encounter of 05/05/15  . ED EKG  . ED EKG    IMPRESSION AND PLAN:   Acute on chronic respiratory failure secondary to community-acquired pneumonia: Patient started on Levaquin, oxygen, DuoNeb's, Solu-Medrol. Patient right now requiring oxygen during the daytime also. Follow clinically and see if we can wean off oxygen during daytime. 2. GERD' continue PPI>  D/w daughter  All the records are reviewed and case discussed with ED provider. Management plans discussed with the patient, family and they are in agreement.  CODE STATUS;Full  TOTAL TIME TAKING CARE OF THIS PATIENT:  55 minutes   Leeroy Lovings M.D on 05/05/2015 at 1:31 PM  Between 7am to 6pm - Pager - [phone]  After 6pm go to [email or web address] - password [email or web address] [email or web address]  [facility]  Office  [phone]  CC: Primary care physician; Luvenia StarchSANTAYANA, GLORIA [name], DO  Note: This dictation was prepared with Dragon dictation along with smaller phrase technology. Any transcriptional errors that result from this process are unintentional.

## 2015-05-05 NOTE — ED Notes (Signed)
C/o productive cough and congestion x 1 weeks, also having sob, hx of COPD and pneumonia, states she is hurting up under her breast like she was before when she had pneumonia

## 2015-05-05 NOTE — ED Notes (Signed)
Pt O2 sats 85-89% on RA during coughing spells; pt placed on 2L nasal cannula, SPO2 92%

## 2015-05-06 ENCOUNTER — Inpatient Hospital Stay: Payer: BLUE CROSS/BLUE SHIELD

## 2015-05-06 LAB — LEGIONELLA PNEUMOPHILA SEROGP 1 UR AG: L. pneumophila Serogp 1 Ur Ag: NEGATIVE

## 2015-05-06 LAB — BASIC METABOLIC PANEL
Anion gap: 9 (ref 5–15)
BUN: 12 mg/dL (ref 6–20)
CHLORIDE: 106 mmol/L (ref 101–111)
CO2: 24 mmol/L (ref 22–32)
CREATININE: 0.58 mg/dL (ref 0.44–1.00)
Calcium: 9 mg/dL (ref 8.9–10.3)
GFR calc non Af Amer: >60 mL/min (ref >60)
GLUCOSE: 174 mg/dL — AB (ref 65–99)
Potassium: 3.8 mmol/L (ref 3.5–5.1)
Sodium: 139 mmol/L (ref 135–145)

## 2015-05-06 LAB — CBC
HEMATOCRIT: 38.3 % (ref 35.0–47.0)
HEMOGLOBIN: 12.7 g/dL (ref 12.0–16.0)
MCH: 31.2 pg (ref 26.0–34.0)
MCHC: 33.3 g/dL (ref 32.0–36.0)
MCV: 93.6 fL (ref 80.0–100.0)
Platelets: 178 10*3/uL (ref 150–440)
RBC: 4.09 MIL/uL (ref 3.80–5.20)
RDW: 12.2 % (ref 11.5–14.5)
WBC: 12.1 10*3/uL — ABNORMAL HIGH (ref 3.6–11.0)

## 2015-05-06 MED ORDER — LEVOFLOXACIN 500 MG PO TABS
500.0000 mg | ORAL_TABLET | Freq: Every day | ORAL | Status: DC
Start: 2015-05-06 — End: 2015-06-29

## 2015-05-06 MED ORDER — PREDNISONE 10 MG PO TABS: 50.0000 mg | ORAL_TABLET | Freq: Every day | ORAL | Status: DC

## 2015-05-06 MED ORDER — SODIUM CHLORIDE 0.9 % IV BOLUS (SEPSIS)
250.0000 mL | Freq: Once | INTRAVENOUS | Status: AC
  Administered 2015-05-06: 10:00:00 250 mL via INTRAVENOUS

## 2015-05-06 MED ORDER — IPRATROPIUM-ALBUTEROL 0.5-2.5 (3) MG/3ML IN SOLN: 3.0000 mL | Freq: Four times a day (QID) | RESPIRATORY_TRACT | Status: DC | PRN

## 2015-05-06 MED ORDER — LEVOFLOXACIN 500 MG PO TABS
500.0000 mg | ORAL_TABLET | Freq: Every day | ORAL | Status: DC
  Administered 2015-05-06: 08:00:00 500 mg via ORAL
  Filled 2015-05-06: qty 1

## 2015-05-06 NOTE — Progress Notes (Signed)
Dr Allena KatzPatel made aware that pt o2 sats 86% at rest on room air this morning, also made aware that pt vomited in bathroom and states that her right lower abdomen has been hurting since last night but pt didn't mention it because she thought it was from her cough, new orders will be placed

## 2015-05-06 NOTE — Discharge Summary (Signed)
[facility] [place] [facility]   PATIENT NAME: Sandy Eaton    MR#:  [id number]  DATE OF BIRTH:  11-16-1940  DATE OF ADMISSION:  05/05/2015 ADMITTING PHYSICIAN: Katha Hamming, MD  DATE OF DISCHARGE: 07/05/14  PRIMARY CARE PHYSICIAN: Luvenia Starch [name], DO    ADMISSION DIAGNOSIS:  Aspiration pneumonia of left lung, unspecified aspiration pneumonia type, unspecified part of lung (HCC) [J69.0]  DISCHARGE DIAGNOSIS:  Left lower lobe community acquired Pneumonia  SECONDARY DIAGNOSIS:   Past Medical History  Diagnosis Date  . Environmental allergies   . COPD (chronic obstructive pulmonary disease) (HCC)   . GERD (gastroesophageal reflux disease)   . Asthma   . Hyperlipidemia     HOSPITAL COURSE:  Sandy Eaton is a 74 year old Caucasian female with past medical history significant for gastroesophageal reflux disease, hyperlipidemia and osteoporosis, who presents from home secondary to worsening shortness of breath and productive phlegm The patient was noted to have left lower lobe pneumonia  1. left  lower lobe pneumonia - Started on IV levaquin  -bood cultures remained negative. Cough improved with Tessalon Perles and she will be discharged home on Levaquin later today if she continues to show improvement No fever, mild wbc 12K  2. mild flare of COPD from being a ex smoker. No wheezing. Continue albuterol inhaler as needed. Set up home nebulizer F/u dr Dema Severin  3. Gastroesophageal reflux disease on Prilosec at home.  4. DVT prophylaxis Lovenox  Spoke with dter Olegario Messier woods. D/c later home if remains stable   CONSULTS OBTAINED:    none  DRUG ALLERGIES:   Allergies  Allergen Reactions  . Augmentin [Amoxicillin-Pot Clavulanate] Nausea And Vomiting  . Valium [Diazepam] Other (See Comments)    Reaction:  Hallucinations     DISCHARGE MEDICATIONS:   Current Discharge Medication List    START taking these  medications   Details  ipratropium-albuterol (DUONEB) 0.5-2.5 (3) MG/3ML SOLN Take 3 mLs by nebulization every 6 (six) hours as needed. Qty: 360 mL, Refills: 2    levofloxacin (LEVAQUIN) 500 MG tablet Take 1 tablet (500 mg total) by mouth daily. Qty: 6 tablet, Refills: 0    predniSONE (DELTASONE) 10 MG tablet Take 5 tablets (50 mg total) by mouth daily with breakfast. Taper by 10 mg daily then stop Qty: 6 tablet, Refills: 0      CONTINUE these medications which have NOT CHANGED   Details  albuterol (PROAIR HFA) 108 (90 BASE) MCG/ACT inhaler Inhale 2 puffs into the lungs every 6 (six) hours as needed for wheezing or shortness of breath. Qty: 1 Inhaler, Refills: 3    aspirin EC 81 MG tablet Take 81 mg by mouth daily.    atorvastatin (LIPITOR) 40 MG tablet Take 40 mg by mouth at bedtime.     beclomethasone (QVAR) 80 MCG/ACT inhaler Inhale 2 puffs into the lungs 2 (two) times daily.     benzonatate (TESSALON) 100 MG capsule Take 100 mg by mouth 3 (three) times daily as needed for cough.    cetirizine (ZYRTEC) 10 MG tablet Take 10 mg by mouth daily as needed for allergies.    fluticasone (FLONASE) 50 MCG/ACT nasal spray Place 1 spray into both nostrils 2 (two) times daily.     montelukast (SINGULAIR) 10 MG tablet Take 10 mg by mouth every morning.     omeprazole (PRILOSEC) 40 MG capsule Take 40 mg by mouth daily.        If you experience worsening of your admission  symptoms, develop shortness of breath, life threatening emergency, suicidal or homicidal thoughts you must seek medical attention immediately by calling 911 or calling your MD immediately  if symptoms less severe.  You Must read complete instructions/literature along with all the possible adverse reactions/side effects for all the Medicines you take and that have been prescribed to you. Take any new Medicines after you have completely understood and accept all the possible adverse reactions/side effects.   Please  note  You were cared for by a hospitalist during your hospital stay. If you have any questions about your discharge medications or the care you received while you were in the hospital after you are discharged, you can call the unit and asked to speak with the hospitalist on call if the hospitalist that took care of you is not available. Once you are discharged, your primary care physician will handle any further medical issues. Please note that NO REFILLS for any discharge medications will be authorized once you are discharged, as it is imperative that you return to your primary care physician (or establish a relationship with a primary care physician if you do not have one) for your aftercare needs so that they can reassess your need for medications and monitor your lab values. Today   SUBJECTIVE   Cough with mild phlegm. No fever  VITAL SIGNS:  Blood pressure 111/63, pulse 95, temperature 98.7 F (37.1 C), temperature source Oral, resp. rate 20, height 5\' 3"  (1.6 m), weight 63.504 kg (140 lb), SpO2 94 %.  I/O:   Intake/Output Summary (Last 24 hours) at 05/06/15 0752 Last data filed at 05/06/15 0616  Gross per 24 hour  Intake    329 ml  Output   1800 ml  Net  -1471 ml    PHYSICAL EXAMINATION:  GENERAL:  74 y.o.-year-old patient lying in the bed with no acute distress.  EYES: Pupils equal, round, reactive to light and accommodation. No scleral icterus. Extraocular muscles intact.  HEENT: Head atraumatic, normocephalic. Oropharynx and nasopharynx clear.  NECK:  Supple, no jugular venous distention. No thyroid enlargement, no tenderness.  LUNGS: Normal breath sounds bilaterally, no wheezing, rales,rhonchi or crepitation. No use of accessory muscles of respiration.  CARDIOVASCULAR: S1, S2 normal. No murmurs, rubs, or gallops.  ABDOMEN: Soft, non-tender, non-distended. Bowel sounds present. No organomegaly or mass.  EXTREMITIES: No pedal edema, cyanosis, or clubbing.  NEUROLOGIC: Cranial  nerves II through XII are intact. Muscle strength 5/5 in all extremities. Sensation intact. Gait not checked.  PSYCHIATRIC: The patient is alert and oriented x 3.  SKIN: No obvious rash, lesion, or ulcer.   DATA REVIEW:   CBC   Recent Labs Lab 05/06/15 0515  WBC 12.1*  HGB 12.7  HCT 38.3  PLT 178    Chemistries   Recent Labs Lab 05/05/15 0817 05/06/15 0515  NA 138 139  K 3.8 3.8  CL 105 106  CO2 25 24  GLUCOSE 143* 174*  BUN 11 12  CREATININE 0.52 0.58  CALCIUM 9.1 9.0  AST 23  --   ALT 20  --   ALKPHOS 154*  --   BILITOT 0.7  --     Microbiology Results   Recent Results (from the past 240 hour(s))  Culture, blood (routine x 2)     Status: None (Preliminary result)   Collection Time: 05/05/15  8:17 AM  Result Value Ref Range Status   Specimen Description BLOOD RIGHT ASSIST CONTROL  Final   Special Requests BOTTLES DRAWN AEROBIC  AND ANAEROBIC  5CC  Final   Culture NO GROWTH < 24 HOURS  Final   Report Status PENDING  Incomplete  Culture, blood (routine x 2)     Status: None (Preliminary result)   Collection Time: 05/05/15  9:30 AM  Result Value Ref Range Status   Specimen Description BLOOD LEFT ASSIST CONTROL  Final   Special Requests BOTTLES DRAWN AEROBIC AND ANAEROBIC  2CC  Final   Culture NO GROWTH < 24 HOURS  Final   Report Status PENDING  Incomplete    RADIOLOGY:  Dg Chest 2 View  05/05/2015  CLINICAL DATA:  Cough EXAM: CHEST  2 VIEW COMPARISON:  11/26/2014 FINDINGS: Left upper lobe airspace disease has resolved. Airspace disease at the right base has resolved. Airspace disease in the posterior basal segment of the left lower lobe has increased. No pneumothorax. No pleural effusion. IMPRESSION: Resolved right lower lobe and left upper lobe airspace disease. New left lower lobe airspace disease. Followup PA and lateral chest X-ray is recommended in 3-4 weeks following trial of antibiotic therapy to ensure resolution and exclude underlying malignancy.  Electronically Signed   By: Jolaine Click M.D.   On: 05/05/2015 09:02     Management plans discussed with the patient, family and they are in agreement.  CODE STATUS:     Code Status Orders        Start     Ordered   05/05/15 0914  Full code   Continuous     05/05/15 0918      TOTAL TIME TAKING CARE OF THIS PATIENT: 40 minutes.    Irena Gaydos M.D on 05/06/2015 at 7:52 AM  Between 7am to 6pm - Pager - [phone] After 6pm go to [email or web address] - password [email or web address] [facility]  [facility]  Office  [phone]  CC: Primary care physician; Luvenia Starch [name], DO

## 2015-05-06 NOTE — Progress Notes (Signed)
Dr Allena KatzPatel made aware that pt states that right before lunch she woke up from her nap, stomach turning pain in back and then had an episode of vomiting small amt, states that she really doesn't feel sick anywhere, states that she still wants to try to go home, pt would like to wait until around 4-5:00 to see how she feels, KUB negative, per Dr Allena KatzPatel continue to monitor pt, if pt feels better around 4-5:00 she may still be discharged home, will keep Allena Katzatel updated

## 2015-05-06 NOTE — Progress Notes (Signed)
Pt states that she feels much better at this time, no further n/v, Dr Allena KatzPatel aware states ok to discharge pt, discharge reviewed with pt and daughter, states understanding, no noted complaints at discharge

## 2015-05-06 NOTE — Plan of Care (Signed)
Problem: Respiratory: Goal: Ability to maintain a clear airway will improve Outcome: Progressing C/o headache relieved by PRN Percocet. C/o cough relieved by PRN Tessalon pearls. VSS, afebrile. O2 sats stable on 2L [place]. Duonebs scheduled. IV Solumedrol given as ordered. SOB w/ exertion improving. Will continue to assess.

## 2015-05-06 NOTE — Progress Notes (Signed)
Patient ID: Sandy Eaton, female   DOB: [birth year], 74 y.o.   MRN: [id number] Per Rn pt had and episode of vomiting. Feels ok.. Declines antiemetics Stas down to 865 on ra on ambualtion Pt will need to use her home oxygen during daytime for few days till she recovers from PNA Order for home oxygen placed.  If feels better will d/c her later else keep her overnite.

## 2015-05-06 NOTE — Progress Notes (Signed)
   05/06/15 0800  Oxygen Therapy  SpO2 (!) 86 %  O2 Device Room Air (at rest)

## 2015-05-06 NOTE — Discharge Instructions (Signed)
Use your home oxygen at night and if needed during daytime as needed Use your nebulizer as needed

## 2015-05-06 NOTE — Care Management (Signed)
Patient presents from home with resiratory failure.  Patient lives at home with her daughter. Patient uses [facility] on [address] to obtain her medications.  Patient transports herself when needed.  Patient currently uses O2 chronically at home at night only.  Provided by [facility].  Order has been placed for continuous home O2 as well as nebulizer.  Will from [facility] has been notified.  Otherwise patient is independent.  RNCM signing off

## 2015-05-07 LAB — URINE CULTURE

## 2015-05-11 LAB — CULTURE, BLOOD (ROUTINE X 2)
CULTURE: NO GROWTH
Culture: NO GROWTH

## 2015-05-20 ENCOUNTER — Ambulatory Visit (INDEPENDENT_AMBULATORY_CARE_PROVIDER_SITE_OTHER): Payer: BLUE CROSS/BLUE SHIELD | Admitting: Pulmonary Disease

## 2015-05-20 ENCOUNTER — Encounter: Payer: Self-pay | Admitting: Pulmonary Disease

## 2015-05-20 ENCOUNTER — Ambulatory Visit
Admission: RE | Admit: 2015-05-20 | Discharge: 2015-05-20 | Disposition: A | Discharge status: Home / Self Care | Payer: BLUE CROSS/BLUE SHIELD | Source: Ambulatory Visit | Attending: Pulmonary Disease | Admitting: Pulmonary Disease

## 2015-05-20 VITALS — BP 130/72 | HR 72 | Ht 63.5 in | Wt 143.8 lb

## 2015-05-20 DIAGNOSIS — J189 Pneumonia, unspecified organism: Secondary | ICD-10-CM

## 2015-05-20 DIAGNOSIS — J449 Chronic obstructive pulmonary disease, unspecified: Secondary | ICD-10-CM | POA: Diagnosis not present

## 2015-05-20 DIAGNOSIS — Z87891 Personal history of nicotine dependence: Secondary | ICD-10-CM | POA: Diagnosis not present

## 2015-05-20 DIAGNOSIS — K449 Diaphragmatic hernia without obstruction or gangrene: Secondary | ICD-10-CM | POA: Insufficient documentation

## 2015-05-20 MED ORDER — UMECLIDINIUM BROMIDE 62.5 MCG/INH IN AEPB
1.0000 | INHALATION_SPRAY | Freq: Every day | RESPIRATORY_TRACT | Status: AC
Start: 2015-05-20 — End: 2015-05-21

## 2015-05-20 NOTE — Patient Instructions (Signed)
Stop Singulair Stop Qvar Trial of Incruse inhaler - take for one week, then off for one week Follow up in 2-3 weeks

## 2015-05-21 NOTE — Progress Notes (Signed)
Sandy Eaton previously seen by Dr Dema SeverinMungal with dx of COPD and bilateral PNA in 11/2014. Hospitalized 11/17 - 05/06/15 with acute onset of dyspnea and CXR revealing small LLL opacity felt to be PNA. This visit was arranged as a post hospital follow up. She was discharged on nocturnal oxygen and told that she would have to remain on this indefinitely. She feels back to her baseline and has little or no exertional limitation. She uses albuterol 0-2 times per week. She has chronic daily sputum production. The documented medical regimen has been confirmed with her. She cannot assert that montelukast has been of any benefit and is unable to say exactly why it was prescribed. She does not believe that the Qvar is of benefit.  Filed Vitals:   05/20/15 0931  BP: 130/72  Pulse: 72  Height: 5' 3.5" (1.613 m)  Weight: 143 lb 12.8 oz (65.227 kg)  SpO2: 97%    Gen: WDWN in NAD HEENT: All WNL Neck: NO LAN, no JVD noted Lungs: full BS, normal percussion note throughout, no adventitious sounds Cardiovascular: Reg rate, normal rhythm, no M noted Abdomen: Soft, NT +BS Ext: no C/C/E Neuro: CNs intact, motor/sens grossly intact Skin: No lesions noted  CBC Latest Ref Rng 05/06/2015 05/05/2015 11/27/2014  WBC 3.6 - 11.0 K/uL 12.1(H) 8.9 12.5(H)  Hemoglobin 12.0 - 16.0 g/dL 91.412.7 78.213.7 11.8(L)  Hematocrit 35.0 - 47.0 % 38.3 40.9 35.0  Platelets 150 - 440 K/uL 178 183 167    BMP Latest Ref Rng 05/06/2015 05/05/2015 11/27/2014  Glucose 65 - 99 mg/dL 956(O174(H) 130(Q143(H) 657(Q150(H)  BUN 6 - 20 mg/dL 12 11 13   Creatinine 0.44 - 1.00 mg/dL 4.690.58 6.290.52 5.280.50  Sodium 135 - 145 mmol/L 139 138 135  Potassium 3.5 - 5.1 mmol/L 3.8 3.8 3.5  Chloride 101 - 111 mmol/L 106 105 105  CO2 22 - 32 mmol/L 24 25 23   Calcium 8.9 - 10.3 mg/dL 9.0 9.1 4.1(L7.5(L)    CXR (05/05/15): retrocardiac density c/w LLL PNA  IMP: Recent CAP - appears resolved Former smoker Chronic obstructive pulmonary disease - chronic bronchitis. No PFTs available for my  review Hypoxemia - this was detected during the hospitalization for PNA. It is not clear to me that she will require lifelong supplemental O2  PLAN: CXR today to follow up on pulmonary infiltrate Check ambulatory and overnight oximetry Stop montelukast and Qvar Trial of Incruse I clarified to take either Claritin or Zyrtec, not both ROV in 2-4 weeks with PFTs  Billy Fischeravid Catrena Vari, MD [facility] Mobile [phone] Pager [phone]

## 2015-06-22 ENCOUNTER — Ambulatory Visit (INDEPENDENT_AMBULATORY_CARE_PROVIDER_SITE_OTHER): Payer: BLUE CROSS/BLUE SHIELD | Admitting: *Deleted

## 2015-06-22 DIAGNOSIS — Z87891 Personal history of nicotine dependence: Secondary | ICD-10-CM | POA: Diagnosis not present

## 2015-06-22 DIAGNOSIS — J449 Chronic obstructive pulmonary disease, unspecified: Secondary | ICD-10-CM | POA: Diagnosis not present

## 2015-06-22 LAB — PULMONARY FUNCTION TEST
DL/VA % PRED: 69 %
DL/VA: 3.36 ml/min/mmHg/L
DLCO UNC: 17.08 ml/min/mmHg
DLCO unc % pred: 70 %
FEF 25-75 Post: 2.16 L/sec
FEF 25-75 Pre: 1.84 L/sec
FEF2575-%Change-Post: 17 %
FEF2575-%PRED-PRE: 110 %
FEF2575-%Pred-Post: 130 %
FEV1-%Change-Post: 0 %
FEV1-%PRED-POST: 108 %
FEV1-%Pred-Pre: 109 %
FEV1-PRE: 2.3 L
FEV1-Post: 2.29 L
FEV1FVC-%Change-Post: -3 %
FEV1FVC-%Pred-Pre: 99 %
FEV6-%Change-Post: 4 %
FEV6-%PRED-POST: 119 %
FEV6-%PRED-PRE: 114 %
FEV6-POST: 3.2 L
FEV6-Pre: 3.06 L
FEV6FVC-%Change-Post: 0 %
FEV6FVC-%PRED-POST: 105 %
FEV6FVC-%Pred-Pre: 104 %
FVC-%Change-Post: 3 %
FVC-%PRED-PRE: 110 %
FVC-%Pred-Post: 113 %
FVC-POST: 3.2 L
FVC-PRE: 3.09 L
POST FEV6/FVC RATIO: 100 %
PRE FEV1/FVC RATIO: 74 %
Post FEV1/FVC ratio: 72 %
Pre FEV6/FVC Ratio: 99 %

## 2015-06-22 NOTE — Progress Notes (Signed)
PFT performed today. 

## 2015-06-29 ENCOUNTER — Ambulatory Visit (INDEPENDENT_AMBULATORY_CARE_PROVIDER_SITE_OTHER): Payer: BLUE CROSS/BLUE SHIELD | Admitting: Pulmonary Disease

## 2015-06-29 ENCOUNTER — Encounter: Payer: Self-pay | Admitting: Pulmonary Disease

## 2015-06-29 VITALS — BP 110/62 | HR 80 | Ht 64.0 in | Wt 143.0 lb

## 2015-06-29 DIAGNOSIS — R0902 Hypoxemia: Secondary | ICD-10-CM | POA: Diagnosis not present

## 2015-06-29 DIAGNOSIS — Z87891 Personal history of nicotine dependence: Secondary | ICD-10-CM | POA: Diagnosis not present

## 2015-06-29 NOTE — Patient Instructions (Addendum)
Your lung function tests are normal  Continue albuterol inhaler as needed (your rescue inhaler)  Stop oxygen - we will have [facility] pick it up from your home Stop nebulizer medications - we will have [facility] pick it up from your home Stop QVar  Change Prilosec, Zyrtec, Flonase to as needed only. You do not need to take these medications on a regular basis  Follow up with me as needed - you may call any time

## 2015-06-30 NOTE — Progress Notes (Signed)
INITIAL EVAL 05/21/15: [age] F previously seen by Dr Dema SeverinMungal with dx of COPD and bilateral PNA in 11/2014. Hospitalized 11/17 - 05/06/15 with acute onset of dyspnea and CXR revealing small LLL opacity felt to be PNA. This visit was arranged as a post hospital follow up. She was discharged on nocturnal oxygen and told that she would have to remain on this indefinitely. She feels back to her baseline and has little or no exertional limitation. She uses albuterol 0-2 times per week. She has chronic daily sputum production. The documented medical regimen has been confirmed with her. She cannot assert that montelukast has been of any benefit and is unable to say exactly why it was prescribed. She does not believe that the Qvar is of benefit. INITIAL IMP 12/03: Recent CAP - appears resolved Former smoker Chronic obstructive pulmonary disease - chronic bronchitis. No PFTs available for my review Hypoxemia - this was detected during the hospitalization for PNA. It is not clear to me that she will require lifelong supplemental O2  INITIAL PLAN 12/03: CXR today to follow up on pulmonary infiltrate Check ambulatory and overnight oximetry Stop montelukast and Qvar Trial of Incruse I clarified to take either Claritin or Zyrtec, not both ROV in 2-4 weeks with PFTs   SUBJ: No new complaints. No distress. No deterioration with discontinuation of montelukast. She continues to use Qvar intermittently...only when she has phlegm, approx 2-3 times per month. She uses albuterol a few times per week. She underwent overnight oximetry 12/20. Results are discussed below   OBJ:  Filed Vitals:   06/29/15 1101  BP: 110/62  Pulse: 80  Height: 5\' 4"  (1.626 m)  Weight: 143 lb (64.864 kg)  SpO2: 95%    Gen: NAD HEENT: WNL Lungs: full BS, no adventitious sounds Cardiovascular: Reg rate, no M Abdomen: Soft, NT +BS Ext: no C/C/E Neuro: grossly normal    CXR (05/20/15): normal  PFTs 06/22/15: normal spirometry,  normal lung volumes, mildly decreased DLCO  Overnight oximetry 06/07/15: Mild desaturation throughout night with majority of time in 85-90% range  IMP: Recent CAP - resolved Former smoker No obstruction on PFTs Nocturnal hypoxemia - on further questioning, she reports that she does snore but feels well rested throughout the day without symptoms of sleep deprivation. She wishes to not wear nocturnal O2   PLAN: DC supplemental oxygen - we will have [facility] pick up Stop nebulizer medications - we will have [facility] pick it up DC QVar Follow up as needed Medications were reviewed in detail and clarified as follows  Outpatient Encounter Prescriptions as of 06/29/2015  Medication Sig  . albuterol (PROAIR HFA) 108 (90 BASE) MCG/ACT inhaler Inhale 2 puffs into the lungs every 6 (six) hours as needed for wheezing or shortness of breath.  Marland Kitchen. aspirin EC 81 MG tablet Take 81 mg by mouth daily.  Marland Kitchen. atorvastatin (LIPITOR) 40 MG tablet Take 40 mg by mouth at bedtime.   . cetirizine (ZYRTEC) 10 MG tablet Take 10 mg by mouth daily as needed for allergies.  . fluticasone (FLONASE) 50 MCG/ACT nasal spray Place 1 spray into both nostrils daily as needed for rhinitis.   Marland Kitchen. omeprazole (PRILOSEC) 40 MG capsule Take 40 mg by mouth daily as needed.   . [DISCONTINUED] beclomethasone (QVAR) 80 MCG/ACT inhaler Inhale 2 puffs into the lungs 2 (two) times daily.   . [DISCONTINUED] ipratropium-albuterol (DUONEB) 0.5-2.5 (3) MG/3ML SOLN Take 3 mLs by nebulization every 6 (six) hours as needed.  . [  DISCONTINUED] montelukast (SINGULAIR) 10 MG tablet Take 10 mg by mouth every morning.   . [DISCONTINUED] benzonatate (TESSALON) 100 MG capsule Take 100 mg by mouth 3 (three) times daily as needed for cough.  . [DISCONTINUED] levofloxacin (LEVAQUIN) 500 MG tablet Take 1 tablet (500 mg total) by mouth daily.   No facility-administered encounter medications on file as of 06/29/2015.    Billy Fischer,  MD PCCM service Mobile [phone] Pager [phone]

## 2015-07-07 ENCOUNTER — Ambulatory Visit: Payer: BLUE CROSS/BLUE SHIELD | Admitting: Internal Medicine

## 2015-08-01 ENCOUNTER — Ambulatory Visit
Admission: RE | Admit: 2015-08-01 | Discharge: 2015-08-01 | Disposition: A | Discharge status: Home / Self Care | Payer: BLUE CROSS/BLUE SHIELD | Source: Ambulatory Visit | Attending: Internal Medicine | Admitting: Internal Medicine

## 2015-08-01 ENCOUNTER — Encounter: Payer: Self-pay | Admitting: Internal Medicine

## 2015-08-01 ENCOUNTER — Ambulatory Visit (INDEPENDENT_AMBULATORY_CARE_PROVIDER_SITE_OTHER): Payer: BLUE CROSS/BLUE SHIELD | Admitting: Internal Medicine

## 2015-08-01 ENCOUNTER — Encounter (INDEPENDENT_AMBULATORY_CARE_PROVIDER_SITE_OTHER): Payer: Self-pay

## 2015-08-01 ENCOUNTER — Other Ambulatory Visit
Admission: RE | Admit: 2015-08-01 | Discharge: 2015-08-01 | Disposition: A | Discharge status: Home / Self Care | Payer: BLUE CROSS/BLUE SHIELD | Source: Ambulatory Visit | Attending: Internal Medicine | Admitting: Internal Medicine

## 2015-08-01 VITALS — BP 122/80 | HR 78 | Temp 97.5°F | Ht 63.5 in | Wt 141.0 lb

## 2015-08-01 DIAGNOSIS — J209 Acute bronchitis, unspecified: Secondary | ICD-10-CM | POA: Diagnosis present

## 2015-08-01 DIAGNOSIS — J449 Chronic obstructive pulmonary disease, unspecified: Secondary | ICD-10-CM

## 2015-08-01 DIAGNOSIS — R079 Chest pain, unspecified: Secondary | ICD-10-CM | POA: Diagnosis not present

## 2015-08-01 DIAGNOSIS — IMO0001 Reserved for inherently not codable concepts without codable children: Secondary | ICD-10-CM

## 2015-08-01 LAB — RAPID INFLUENZA A&B ANTIGENS: Influenza A (ARMC): NOT DETECTED

## 2015-08-01 LAB — RAPID INFLUENZA A&B ANTIGENS (ARMC ONLY): INFLUENZA B (ARMC): NOT DETECTED

## 2015-08-01 MED ORDER — AZITHROMYCIN 250 MG PO TABS: ORAL_TABLET | ORAL | Status: AC

## 2015-08-01 MED ORDER — PREDNISONE 10 MG (21) PO TBPK: 10.0000 mg | ORAL_TABLET | Freq: Every day | ORAL | Status: DC

## 2015-08-01 MED ORDER — UMECLIDINIUM BROMIDE 62.5 MCG/INH IN AEPB
1.0000 | INHALATION_SPRAY | Freq: Every day | RESPIRATORY_TRACT | Status: AC
Start: 2015-08-01 — End: 2015-08-02

## 2015-08-01 MED ORDER — UMECLIDINIUM BROMIDE 62.5 MCG/INH IN AEPB: 1.0000 | INHALATION_SPRAY | Freq: Every day | RESPIRATORY_TRACT | Status: AC

## 2015-08-01 NOTE — Patient Instructions (Addendum)
-  Prednisone 10 mg tabs x 21. Take 6 tabs first day, then 5-4-3-2-1-stop.  --CXR 2 view.  --Z-pack.  --Influenza swab.   --Follow up with Dr. Sung Amabile in 3-4 months.

## 2015-08-01 NOTE — Progress Notes (Signed)
[facility]     Assessment and Plan:  Acute bronchitis, with flu like illness.  -Will check CXR, will give script for prednisone taper and Z-pack.  -We'll check influenza swab.  Acute respiratory failure. -Secondary to above. Given her recent episodes of pneumonia. Will check a chest x-ray.  Chronic bronchitis.  -Given a sample of incruz inhaler and demonstrated its use.   Date: 08/01/2015  MRN# [id number] Sandy Eaton July 19, 1940   Sandy Eaton is a 75 y.o. old female seen in follow up for chief complaint of  Chief Complaint  Patient presents with  . Acute Visit    pt. c/o increased SOB. prod. cough clear in color. occ. wheezing. chest pain/tightness X4d. pain in back when breathing. runny nose. throat feels like it closing up.      HPI:   The patient is a 75 year old female with a history of chronic bronchitis. Review of PFT tracings and raw data from January 2017 were reviewed, this shows no evidence of obstructive lung disease. At last visit, she was seen here with one of my partners, at that time she was asked to stop Singulair and Qvar, she was tried on a trial of Incruz, and asked to take and antihistamine.  She is here today for a sick visit, she notes that she started pain in her chest, back, head, and throat. The symptoms started about 5 days ago.   She never started incruz, and is using only proair. She notes that these symptoms are similar to her previous episodes of pneumonia.    SIX MIN WALK 05/20/2015  Supplimental Oxygen during Test? (L/min) No     Medication:   Outpatient Encounter Prescriptions as of 08/01/2015  Medication Sig  . albuterol (PROAIR HFA) 108 (90 BASE) MCG/ACT inhaler Inhale 2 puffs into the lungs every 6 (six) hours as needed for wheezing or shortness of breath.  Marland Kitchen aspirin EC 81 MG tablet Take 81 mg by mouth daily.  Marland Kitchen atorvastatin (LIPITOR) 40 MG tablet Take 40 mg by mouth at bedtime.   . cetirizine (ZYRTEC) 10 MG  tablet Take 10 mg by mouth daily as needed for allergies.  . fluticasone (FLONASE) 50 MCG/ACT nasal spray Place 1 spray into both nostrils daily as needed for rhinitis.   Marland Kitchen omeprazole (PRILOSEC) 40 MG capsule Take 40 mg by mouth daily as needed.    No facility-administered encounter medications on file as of 08/01/2015.     Allergies:  Augmentin and Valium  Review of Systems: Gen:  Denies  fever, sweats. HEENT: Denies blurred vision. Cvc:  No dizziness, chest pain or heaviness Resp:   Denies cough or sputum porduction. Gi: Denies swallowing difficulty, stomach pain.  Gu:  Denies bladder incontinence, burning urine Ext:   No Joint pain, stiffness. Skin: No skin rash, easy bruising. Endoc:  No polyuria, polydipsia. Psych: No depression, insomnia. Other:  All other systems were reviewed and found to be negative other than what is mentioned in the HPI.   Physical Examination:   VS: BP 122/80 mmHg  Pulse 78  Temp(Src) 97.5 F (36.4 C)  Ht 5' 3.5" (1.613 m)  Wt 141 lb (63.957 kg)  BMI 24.58 kg/m2  SpO2 93%  General Appearance: No distress  Neuro:without focal findings,  speech normal,  HEENT: PERRLA, EOM intact. Pulmonary: normal breath sounds, No wheezing. Decreased air entry bilaterally.   CardiovascularNormal S1,S2.  No m/r/g.   Abdomen: Benign, Soft, non-tender. Renal:  No costovertebral tenderness  GU:  Not performed at this time. Endoc: No evident thyromegaly, no signs of acromegaly. Skin:   warm, no rash. Extremities: normal, no cyanosis, clubbing.   LABORATORY PANEL:   CBC No results for input(s): WBC, HGB, HCT, PLT in the last 168 hours. ------------------------------------------------------------------------------------------------------------------  Chemistries  No results for input(s): NA, K, CL, CO2, GLUCOSE, BUN, CREATININE, CALCIUM, MG, AST, ALT, ALKPHOS, BILITOT in the last 168 hours.  Invalid input(s):  GFRCGP ------------------------------------------------------------------------------------------------------------------  Cardiac Enzymes No results for input(s): TROPONINI in the last 168 hours. ------------------------------------------------------------  RADIOLOGY:   No results found for this or any previous visit. Results for orders placed during the hospital encounter of 05/20/15  DG Chest 2 View   Narrative CLINICAL DATA:  Two episodes of pneumonia over the past 6 months, currently asymptomatic, history of COPD and former smoker.  EXAM: CHEST  2 VIEW  COMPARISON:  PA and lateral chest x-ray May 05, 2015  FINDINGS: The lungs are adequately inflated. There is no focal infiltrate. There is no pleural effusion. The heart and pulmonary vascularity are normal. The mediastinum is normal in width. There is stable tortuosity of the descending thoracic aorta. There is small hiatal hernia. The bony structures are unremarkable.  IMPRESSION: There is no evidence of pneumonia nor other active cardiopulmonary disease.   Electronically Signed   By: David  [place] M.D.   On: 05/20/2015 13:16    ------------------------------------------------------------------------------------------------------------------  Thank  you for allowing [facility] to assist in the care of your patient. Our recommendations are noted above.  Please contact us if we can be of further service.   Sandy Guiles, MD.  [facility]   Sandy Eaton, M.D.  Sandy Eaton, M.D.  Sandy Eaton, M.D

## 2015-08-03 ENCOUNTER — Telehealth: Payer: Self-pay | Admitting: Internal Medicine

## 2015-08-03 MED ORDER — BENZONATATE 200 MG PO CAPS: 200.0000 mg | ORAL_CAPSULE | Freq: Three times a day (TID) | ORAL | Status: DC | PRN

## 2015-08-03 NOTE — Telephone Encounter (Signed)
Pt informed of response. rx sent to pharmacy. Nothing further needed.

## 2015-08-03 NOTE — Telephone Encounter (Signed)
Flu negative, CXR normal.   Can start tessalon 200 mg three times daily for 1 month.   Can inform her that the cough will remain for several more weeks, but should be tapering off during that time.

## 2015-08-03 NOTE — Telephone Encounter (Signed)
Please advise 

## 2015-08-08 ENCOUNTER — Telehealth: Payer: Self-pay | Admitting: *Deleted

## 2015-08-08 NOTE — Telephone Encounter (Signed)
Pt called requesting a doctor's note to go back to work. States she has been out since she was sick which in your OV note states pt had symptoms that started 4-5 days prior to her coming in on the 13th. Pt states as of today she still hasn't gone back to work. Offered pt a note through the 15th which is when we gave results of the flu test. Pt is wanting to come in and see someone else asking if she comes in will they write her out from 07/28/15 till 08/09/15. Your OV note didn't specify about pt being out of work. Please advise on how long you want the pt written out of work. Pt advised you would be back on Tuesday.

## 2015-08-10 NOTE — Telephone Encounter (Signed)
States she went to see her PCP and they gave her an abx and a note for the time she has missed work. Pt states thanks for the offer for the note. Nothing further needed.

## 2015-08-10 NOTE — Telephone Encounter (Signed)
Can give note for 2/13 to 2/20. Otherwise will need to come back or see PCP.

## 2015-08-19 ENCOUNTER — Telehealth: Payer: Self-pay | Admitting: Pulmonary Disease

## 2015-08-19 ENCOUNTER — Encounter: Payer: Self-pay | Admitting: *Deleted

## 2015-08-19 ENCOUNTER — Encounter: Payer: Self-pay | Admitting: Pulmonary Disease

## 2015-08-19 ENCOUNTER — Ambulatory Visit (INDEPENDENT_AMBULATORY_CARE_PROVIDER_SITE_OTHER): Payer: BLUE CROSS/BLUE SHIELD | Admitting: Pulmonary Disease

## 2015-08-19 VITALS — BP 122/78 | HR 89 | Ht 63.5 in | Wt 139.0 lb

## 2015-08-19 DIAGNOSIS — R059 Cough, unspecified: Secondary | ICD-10-CM

## 2015-08-19 DIAGNOSIS — R05 Cough: Secondary | ICD-10-CM | POA: Diagnosis not present

## 2015-08-19 DIAGNOSIS — J209 Acute bronchitis, unspecified: Secondary | ICD-10-CM | POA: Diagnosis not present

## 2015-08-19 MED ORDER — BUDESONIDE-FORMOTEROL FUMARATE 80-4.5 MCG/ACT IN AERO: 2.0000 | INHALATION_SPRAY | Freq: Two times a day (BID) | RESPIRATORY_TRACT | Status: DC

## 2015-08-19 MED ORDER — HYDROCOD POLST-CPM POLST ER 10-8 MG/5ML PO SUER: 5.0000 mL | Freq: Every evening | ORAL | Status: DC | PRN

## 2015-08-19 MED ORDER — BUDESONIDE-FORMOTEROL FUMARATE 160-4.5 MCG/ACT IN AERO: 2.0000 | INHALATION_SPRAY | Freq: Two times a day (BID) | RESPIRATORY_TRACT | Status: DC

## 2015-08-19 MED ORDER — DOXYCYCLINE HYCLATE 100 MG PO TABS: 100.0000 mg | ORAL_TABLET | Freq: Two times a day (BID) | ORAL | Status: DC

## 2015-08-19 NOTE — Telephone Encounter (Signed)
Pt informed. States she will let us know what is covered. Nothing further needed.

## 2015-08-19 NOTE — Telephone Encounter (Signed)
Pt c/o medication issue:  1. Name of Medication: chlorpheniramine-HYDROcodone (TUSSIONEX PENNKINETIC ER) 10-8 MG/5ML SUER  2. How are you currently taking this medication (dosage and times per day)? See above  3. Are you having a reaction (difficulty breathing--STAT)? No   4. What is your medication issue? Patient insurance copay is not affordable is there an alternative.  Please call.

## 2015-08-19 NOTE — Telephone Encounter (Signed)
err

## 2015-08-19 NOTE — Telephone Encounter (Signed)
Pt was prescribed this medication today when she seen DS. Please advise on alternative please since copay is too much.

## 2015-08-19 NOTE — Telephone Encounter (Signed)
There are numerous alternatives, will need to know what her insurance covers. Meanwhile take robitussin DM over the counter as directed per package.

## 2015-08-23 NOTE — Progress Notes (Signed)
PROBLEMS: Hospitalization 11/17-11/18/16 for PNA. Discharged on home O2 which was eventually stopped Normal PFTs 06/22/15  INTERVAL HISTORY: Seen by Dr Nicholos Johnsamachandran 08/01/15 with acute bronchitis and treated with azithromycin, prednisone. Started on Incruse inhaler  SUBJ: Modestly better but still with severe cough productive of milky white sputum. Denies CP, fever, purulent sputum, hemoptysis, LE edema and calf tenderness  OBJ: Filed Vitals:   08/19/15 1139  BP: 122/78  Pulse: 89  Height: 5' 3.5" (1.613 m)  Weight: 139 lb (63.05 kg)  SpO2: 93%   No overt respiratory distress Hoarse voice quality HEENT WNL BS full without wheezes Reg, no M NABS, soft No C/C/E  DATA: BMP Latest Ref Rng 05/06/2015 05/05/2015 11/27/2014  Glucose 65 - 99 mg/dL 161(W174(H) 960(A143(H) 540(J150(H)  BUN 6 - 20 mg/dL 12 11 13   Creatinine 0.44 - 1.00 mg/dL 8.110.58 9.140.52 7.820.50  Sodium 135 - 145 mmol/L 139 138 135  Potassium 3.5 - 5.1 mmol/L 3.8 3.8 3.5  Chloride 101 - 111 mmol/L 106 105 105  CO2 22 - 32 mmol/L 24 25 23   Calcium 8.9 - 10.3 mg/dL 9.0 9.1 9.5(A7.5(L)    CBC Latest Ref Rng 05/06/2015 05/05/2015 11/27/2014  WBC 3.6 - 11.0 K/uL 12.1(H) 8.9 12.5(H)  Hemoglobin 12.0 - 16.0 g/dL 21.312.7 08.613.7 11.8(L)  Hematocrit 35.0 - 47.0 % 38.3 40.9 35.0  Platelets 150 - 440 K/uL 178 183 167   CXR (08/01/15): [facility]    IMPRESSION: Recent PNA c/b hypoxemia - resolved Acute bronchitis with severe cough Possible asthma or asthma-like syndrome (?post-infectious)  PLAN: DC Incruse Symbicort 80/4.5 - 2 actuations BID Doxycycline 100 mg BID X 7 days Tussionex @ HS PRN until cough resolved ROV 6 weeks   Merwyn Katosavid B Cynda Soule, MD [facility]

## 2015-10-05 ENCOUNTER — Ambulatory Visit: Payer: BLUE CROSS/BLUE SHIELD | Admitting: Pulmonary Disease

## 2015-11-16 ENCOUNTER — Encounter: Payer: Self-pay | Admitting: Pulmonary Disease

## 2015-11-16 ENCOUNTER — Ambulatory Visit (INDEPENDENT_AMBULATORY_CARE_PROVIDER_SITE_OTHER): Payer: BLUE CROSS/BLUE SHIELD | Admitting: Pulmonary Disease

## 2015-11-16 VITALS — BP 120/72 | HR 87 | Ht 63.5 in | Wt 142.0 lb

## 2015-11-16 DIAGNOSIS — J45909 Unspecified asthma, uncomplicated: Secondary | ICD-10-CM

## 2015-11-16 DIAGNOSIS — J449 Chronic obstructive pulmonary disease, unspecified: Secondary | ICD-10-CM | POA: Diagnosis not present

## 2015-11-16 MED ORDER — BUDESONIDE-FORMOTEROL FUMARATE 160-4.5 MCG/ACT IN AERO: 2.0000 | INHALATION_SPRAY | Freq: Two times a day (BID) | RESPIRATORY_TRACT | Status: DC

## 2015-11-16 NOTE — Progress Notes (Signed)
PROBLEMS: Hospitalization 11/17-11/18/16 for PNA Former smoker Chronic asthmatic bronchitis Normal PFTs 06/22/15  INTERVAL HISTORY: No major events  SUBJ: Reports mild increase in DOE over past month which she attributes to weather change and increase pollens in air. Also notes mild hoarseness. Denies CP, fever, purulent sputum, hemoptysis, LE edema and calf tenderness   OBJ: Filed Vitals:   11/16/15 1039  BP: 120/72  Pulse: 87  Height: 5' 3.5" (1.613 m)  Weight: 142 lb (64.411 kg)  SpO2: 95%   No overt respiratory distress HEENT WNL BS full without wheezes Reg, no M NABS, soft No C/C/E  DATA: BMP Latest Ref Rng 05/06/2015 05/05/2015 11/27/2014  Glucose 65 - 99 mg/dL 161(W174(H) 960(A143(H) 540(J150(H)  BUN 6 - 20 mg/dL 12 11 13   Creatinine 0.44 - 1.00 mg/dL 8.110.58 9.140.52 7.820.50  Sodium 135 - 145 mmol/L 139 138 135  Potassium 3.5 - 5.1 mmol/L 3.8 3.8 3.5  Chloride 101 - 111 mmol/L 106 105 105  CO2 22 - 32 mmol/L 24 25 23   Calcium 8.9 - 10.3 mg/dL 9.0 9.1 9.5(A7.5(L)    CBC Latest Ref Rng 05/06/2015 05/05/2015 11/27/2014  WBC 3.6 - 11.0 K/uL 12.1(H) 8.9 12.5(H)  Hemoglobin 12.0 - 16.0 g/dL 21.312.7 08.613.7 11.8(L)  Hematocrit 35.0 - 47.0 % 38.3 40.9 35.0  Platelets 150 - 440 K/uL 178 183 167   CXR: NNF   IMPRESSION: Chronic asthmatic bronchitis  - mild increase in symptoms possibly due to pollens Hoarseness   PLAN: Increase Symbicort to 160/4.5 - 2 actuations BID She is to call if her symptoms do not improve with this change Otherwise ORV in 3 months   Billy Fischeravid Gari Trovato, MD PCCM service Mobile [phone] Pager [phone] 11/16/2015

## 2016-02-16 ENCOUNTER — Ambulatory Visit: Payer: BLUE CROSS/BLUE SHIELD | Admitting: Pulmonary Disease

## 2016-02-24 ENCOUNTER — Other Ambulatory Visit: Payer: Self-pay | Admitting: Family Medicine

## 2016-02-24 DIAGNOSIS — Z1231 Encounter for screening mammogram for malignant neoplasm of breast: Secondary | ICD-10-CM

## 2016-02-27 ENCOUNTER — Ambulatory Visit: Payer: BLUE CROSS/BLUE SHIELD | Admitting: Pulmonary Disease

## 2016-02-27 ENCOUNTER — Other Ambulatory Visit: Payer: Self-pay | Admitting: Family Medicine

## 2016-02-27 DIAGNOSIS — Z13820 Encounter for screening for osteoporosis: Secondary | ICD-10-CM

## 2016-03-21 ENCOUNTER — Ambulatory Visit
Admission: RE | Admit: 2016-03-21 | Discharge: 2016-03-21 | Disposition: A | Discharge status: Home / Self Care | Payer: BLUE CROSS/BLUE SHIELD | Source: Ambulatory Visit | Attending: Family Medicine | Admitting: Family Medicine

## 2016-03-21 DIAGNOSIS — Z1231 Encounter for screening mammogram for malignant neoplasm of breast: Secondary | ICD-10-CM | POA: Diagnosis present

## 2016-04-24 ENCOUNTER — Emergency Department
Admission: EM | Admit: 2016-04-24 | Discharge: 2016-04-24 | Disposition: A | Discharge status: Home / Self Care | Payer: BLUE CROSS/BLUE SHIELD | Attending: Emergency Medicine | Admitting: Emergency Medicine

## 2016-04-24 ENCOUNTER — Emergency Department: Payer: BLUE CROSS/BLUE SHIELD

## 2016-04-24 ENCOUNTER — Encounter: Payer: Self-pay | Admitting: Emergency Medicine

## 2016-04-24 ENCOUNTER — Telehealth: Payer: Self-pay | Admitting: Pulmonary Disease

## 2016-04-24 DIAGNOSIS — Z79899 Other long term (current) drug therapy: Secondary | ICD-10-CM | POA: Diagnosis not present

## 2016-04-24 DIAGNOSIS — J449 Chronic obstructive pulmonary disease, unspecified: Secondary | ICD-10-CM | POA: Insufficient documentation

## 2016-04-24 DIAGNOSIS — R05 Cough: Secondary | ICD-10-CM | POA: Diagnosis present

## 2016-04-24 DIAGNOSIS — Z87891 Personal history of nicotine dependence: Secondary | ICD-10-CM | POA: Diagnosis not present

## 2016-04-24 DIAGNOSIS — Z7982 Long term (current) use of aspirin: Secondary | ICD-10-CM | POA: Diagnosis not present

## 2016-04-24 DIAGNOSIS — R059 Cough, unspecified: Secondary | ICD-10-CM

## 2016-04-24 LAB — CBC
HEMATOCRIT: 44.8 % (ref 35.0–47.0)
HEMOGLOBIN: 15.2 g/dL (ref 12.0–16.0)
MCH: 32 pg (ref 26.0–34.0)
MCHC: 33.8 g/dL (ref 32.0–36.0)
MCV: 94.5 fL (ref 80.0–100.0)
Platelets: 166 10*3/uL (ref 150–440)
RBC: 4.74 MIL/uL (ref 3.80–5.20)
RDW: 12.4 % (ref 11.5–14.5)
WBC: 9.7 10*3/uL (ref 3.6–11.0)

## 2016-04-24 LAB — BASIC METABOLIC PANEL
ANION GAP: 8 (ref 5–15)
BUN: 13 mg/dL (ref 6–20)
CHLORIDE: 108 mmol/L (ref 101–111)
CO2: 23 mmol/L (ref 22–32)
Calcium: 9 mg/dL (ref 8.9–10.3)
Creatinine, Ser: 0.52 mg/dL (ref 0.44–1.00)
GFR calc non Af Amer: >60 mL/min (ref >60)
Glucose, Bld: 122 mg/dL — ABNORMAL HIGH (ref 65–99)
POTASSIUM: 4.1 mmol/L (ref 3.5–5.1)
Sodium: 139 mmol/L (ref 135–145)

## 2016-04-24 LAB — TROPONIN I: Troponin I: <0.03 ng/mL (ref <0.03)

## 2016-04-24 MED ORDER — LEVOFLOXACIN 750 MG PO TABS
750.0000 mg | ORAL_TABLET | Freq: Once | ORAL | Status: AC
  Administered 2016-04-24: 750 mg via ORAL
  Filled 2016-04-24: qty 1

## 2016-04-24 MED ORDER — HYDROCOD POLST-CPM POLST ER 10-8 MG/5ML PO SUER
5.0000 mL | Freq: Once | ORAL | Status: AC
  Administered 2016-04-24: 5 mL via ORAL
  Filled 2016-04-24: qty 5

## 2016-04-24 MED ORDER — HYDROCOD POLST-CPM POLST ER 10-8 MG/5ML PO SUER: 5.0000 mL | Freq: Two times a day (BID) | ORAL | 0 refills | Status: DC

## 2016-04-24 MED ORDER — LEVOFLOXACIN 500 MG PO TABS: 500.0000 mg | ORAL_TABLET | Freq: Every day | ORAL | 0 refills | Status: AC

## 2016-04-24 MED ORDER — IPRATROPIUM-ALBUTEROL 0.5-2.5 (3) MG/3ML IN SOLN
3.0000 mL | Freq: Once | RESPIRATORY_TRACT | Status: AC
  Administered 2016-04-24: 3 mL via RESPIRATORY_TRACT
  Filled 2016-04-24: qty 3

## 2016-04-24 NOTE — Telephone Encounter (Signed)
Pt is calling states her throat is sore, and her eyes hurt, her head hurts, "everything hurts". Please call.

## 2016-04-24 NOTE — ED Notes (Signed)
Patient states, "I think I have pneumonia. It never shows up on the xray".

## 2016-04-24 NOTE — Telephone Encounter (Signed)
Pt states throat is hurting, HA, eyes hurt. Informed pt she needs to contact her PCP due to there is not so much a breathing issue. Nothing further needed.

## 2016-04-24 NOTE — ED Provider Notes (Signed)
[facility] Emergency Department Provider Note        Time seen: ----------------------------------------- 2:22 PM on 04/24/2016 -----------------------------------------    I have reviewed the triage vital signs and the nursing notes.   HISTORY  Chief Complaint Cough and Back Pain    HPI Sandy Eaton is a 75 y.o. female who presents to the ER forsore throat back pain, chest pain for last 4 days. She separate productive cough for the last 24 hours. She's not had fevers, sometimes feels cold but has not had chills. She's had fluid pneumonia vaccines this year. Patient has felt similarly when she has had pneumonia in the past. CT scan of the chest last year revealed resolving pneumonia. She is complaining of some headache at this time.   Past Medical History:  Diagnosis Date  . COPD (chronic obstructive pulmonary disease) (HCC)    Suspected  . Environmental allergies   . GERD (gastroesophageal reflux disease)   . Hyperlipidemia     Patient Active Problem List   Diagnosis Date Noted  . Pneumonia 05/05/2015  . Hospital acquired PNA 11/26/2014  . SOB (shortness of breath) 02/24/2014  . DOE (dyspnea on exertion) 02/24/2014  . Cough 02/24/2014  . 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 ROTATOR CUFF REPAIR Right   . VAGINAL HYSTERECTOMY     1960's    Allergies Augmentin [amoxicillin-pot clavulanate] and Valium [diazepam]  Social History Social History  Substance Use Topics  . Smoking status: Former Smoker    Packs/day: 1.00    Years: 30.00    Types: Cigarettes    Quit date: 02/24/2009  . Smokeless tobacco: Never Used  . Alcohol use No    Review of Systems Constitutional: Negative for fever. ENT: Positive for sore throat Cardiovascular: Negative for chest pain. Respiratory: Positive for shortness of breath, cough Gastrointestinal: Negative for abdominal pain, vomiting and  diarrhea. Genitourinary: Negative for dysuria. Musculoskeletal: Positive for back pain Skin: Negative for rash. Neurological: Negative for headaches, focal weakness or numbness.  10-point ROS otherwise negative.  ____________________________________________   PHYSICAL EXAM:  VITAL SIGNS: ED Triage Vitals [04/24/16 1125]  Enc Vitals Group     BP 118/77     Pulse Rate 95     Resp 18     Temp 98.2 F (36.8 C)     Temp Source Oral     SpO2 94 %     Weight 138 lb (62.6 kg)     Height 5\' 3"  (1.6 m)     Head Circumference      Peak Flow      Pain Score 9     Pain Loc      Pain Edu?      Excl. in GC?     Constitutional: Alert and oriented. Well appearing and in no distress. Eyes: Conjunctivae are normal. PERRL. Normal extraocular movements. ENT   Head: Normocephalic and atraumatic.   Nose: No congestion/rhinnorhea.   Mouth/Throat: Mucous membranes are moist.   Neck: No stridor. Cardiovascular: Normal rate, regular rhythm. No murmurs, rubs, or gallops. Respiratory: Normal respiratory effort without tachypnea nor retractions. Breath sounds are clear and equal bilaterally. No wheezes/rales/rhonchi. Gastrointestinal: Soft and nontender. Normal bowel sounds Musculoskeletal: Nontender with normal range of motion in all extremities. No lower extremity tenderness nor edema. Neurologic:  Normal speech and language. No gross focal neurologic deficits are appreciated.  Skin:  Skin is warm, dry and intact. No rash noted.  Psychiatric: Mood and affect are normal. Speech and behavior are normal.  ____________________________________________  EKG: Interpreted by me. Sinus rhythm with a rate of 97 bpm, normal PR interval, normal QRS, normal QT, normal axis.  ____________________________________________  ED COURSE:  Pertinent labs & imaging results that were available during my care of the patient were reviewed by me and considered in my medical decision making (see chart for  details). Clinical Course   Patient presents to the ER in no distress. We will assess with labs and imaging.  Procedures ____________________________________________   LABS (pertinent positives/negatives)  Labs Reviewed  BASIC METABOLIC PANEL - Abnormal; Notable for the following:       Result Value   Glucose, Bld 122 (*)    All other components within normal limits  CBC  TROPONIN I    RADIOLOGY  Chest x-ray is unremarkable IMPRESSION: 1. No acute cardiopulmonary disease or significant interval change. 2. Small hiatal hernia.  ____________________________________________  FINAL ASSESSMENT AND PLAN  Cough  Plan: Patient with labs and imaging as dictated above. Patient with chronic asthmatic bronchitis. Due to her history, I will prescribe antibiotics, cough medication and advised increased breathing treatments. Patient family are agreeable to her plan, she will follow up with pulmonology as an outpatient.   Emily FilbertWilliams, Berneta Sconyers E, MD   Note: This dictation was prepared with Dragon dictation. Any transcriptional errors that result from this process are unintentional    Emily FilbertJonathan E Gloyd Happ, MD 04/24/16 1434

## 2016-04-24 NOTE — ED Triage Notes (Signed)
C/O sore throat, mid back and mid chest pain x 4 days.  Also describes productive cough x 1 day.  Unknown if any fevers, at times feels chills.  Patient has had a flu and pneumonia vaccination this year.

## 2016-05-23 ENCOUNTER — Encounter: Payer: Self-pay | Admitting: Pulmonary Disease

## 2016-05-23 ENCOUNTER — Ambulatory Visit (INDEPENDENT_AMBULATORY_CARE_PROVIDER_SITE_OTHER): Payer: BLUE CROSS/BLUE SHIELD | Admitting: Pulmonary Disease

## 2016-05-23 VITALS — BP 120/60 | HR 90 | Ht 63.5 in | Wt 141.0 lb

## 2016-05-23 DIAGNOSIS — J45909 Unspecified asthma, uncomplicated: Secondary | ICD-10-CM

## 2016-05-23 MED ORDER — BUDESONIDE-FORMOTEROL FUMARATE 80-4.5 MCG/ACT IN AERO: 2.0000 | INHALATION_SPRAY | Freq: Two times a day (BID) | RESPIRATORY_TRACT | 0 refills | Status: DC

## 2016-05-23 MED ORDER — BENZONATATE 100 MG PO CAPS: 100.0000 mg | ORAL_CAPSULE | Freq: Four times a day (QID) | ORAL | 10 refills | Status: AC | PRN

## 2016-05-23 MED ORDER — AZITHROMYCIN 250 MG PO TABS: ORAL_TABLET | ORAL | 0 refills | Status: DC

## 2016-05-23 MED ORDER — BUDESONIDE-FORMOTEROL FUMARATE 160-4.5 MCG/ACT IN AERO: 2.0000 | INHALATION_SPRAY | Freq: Two times a day (BID) | RESPIRATORY_TRACT | 0 refills | Status: DC

## 2016-05-23 MED ORDER — BUDESONIDE-FORMOTEROL FUMARATE 80-4.5 MCG/ACT IN AERO: 2.0000 | INHALATION_SPRAY | Freq: Two times a day (BID) | RESPIRATORY_TRACT | 12 refills | Status: DC

## 2016-05-23 NOTE — Patient Instructions (Addendum)
1) Resume Symbicort - I have re-ordered it in the 80 strength 2) Z pak as prescribed 3) Tessalon perles ordered 4) Continue Ventolin inhaler as needed 5) Follow up in 3 months

## 2016-05-24 NOTE — Progress Notes (Signed)
PROBLEMS: Hospitalization 11/17-11/18/16 for PNA Former smoker Chronic asthmatic bronchitis Normal PFTs 06/22/15  INTERVAL HISTORY: No major events  SUBJ: She has been doing great until last couple of days. She has not been using Symbicort due to cost. She is using ventolin MDI approx 1 time per day on avg. Now with 4 days of hoarseness, cough, rhinorrhea and chest tightness. She has mild increase in SOB. Her cough is mildly productive of clear mucus   OBJ: Vitals:   05/23/16 1117  BP: 120/60  Pulse: 90  SpO2: 95%  Weight: 141 lb (64 kg)  Height: 5' 3.5" (1.613 m)   No overt respiratory distress HEENT WNL BS full without wheezes Reg, no M NABS, soft No C/C/E  DATA: BMP Latest Ref Rng & Units 04/24/2016 05/06/2015 05/05/2015  Glucose 65 - 99 mg/dL 161(W122(H) 960(A174(H) 540(J143(H)  BUN 6 - 20 mg/dL 13 12 11   Creatinine 0.44 - 1.00 mg/dL 8.110.52 9.140.58 7.820.52  Sodium 135 - 145 mmol/L 139 139 138  Potassium 3.5 - 5.1 mmol/L 4.1 3.8 3.8  Chloride 101 - 111 mmol/L 108 106 105  CO2 22 - 32 mmol/L 23 24 25   Calcium 8.9 - 10.3 mg/dL 9.0 9.0 9.1    CBC Latest Ref Rng & Units 04/24/2016 05/06/2015 05/05/2015  WBC 3.6 - 11.0 K/uL 9.7 12.1(H) 8.9  Hemoglobin 12.0 - 16.0 g/dL 95.615.2 21.312.7 08.613.7  Hematocrit 35.0 - 47.0 % 44.8 38.3 40.9  Platelets 150 - 440 K/uL 166 178 183   CXR: NNF   IMPRESSION: Chronic asthmatic bronchitis with mild acute exacerbation   PLAN: 1) Resume Symbicort - I have re-ordered it in the 80-4.5 strength 2) Z pak prescribed 3) Tessalon perles ordered per her request 4) Continue Ventolin inhaler as needed 5) Follow up in 3 months  Billy Fischeravid Simonds, MD PCCM service Mobile [phone] Pager [phone] 05/24/2016

## 2016-05-28 ENCOUNTER — Telehealth: Payer: Self-pay | Admitting: Pulmonary Disease

## 2016-05-28 MED ORDER — LEVOFLOXACIN 500 MG PO TABS: 500.0000 mg | ORAL_TABLET | Freq: Every day | ORAL | 0 refills | Status: AC

## 2016-05-28 NOTE — Telephone Encounter (Signed)
Pt. Returned our call and given Dr. Sung AmabileSimonds recc. Nothing further is needed at this time.

## 2016-05-28 NOTE — Telephone Encounter (Signed)
Dr. Sung AmabileSimonds  Please Advise- Sick Message  Pt. Called and stated that she finished her antibiotic but she is still c/o of a cough with off white mucus, increase sob,some wheezing,denies fever states she has been taking the tessalon and it is giving her some relief. Wanted to know if she could get an antibiotic sent it.

## 2016-05-28 NOTE — Telephone Encounter (Signed)
LMOMTCB x 1 

## 2016-05-28 NOTE — Telephone Encounter (Signed)
Levofloxacin 500 mg daily X 5 days. I have placed order  Theodoro Gristave

## 2016-05-28 NOTE — Telephone Encounter (Signed)
Patient finished her antibiotic and is still feeling bad and missing work and needs another antibiotic.

## 2016-06-22 ENCOUNTER — Telehealth: Payer: Self-pay | Admitting: Pulmonary Disease

## 2016-06-22 NOTE — Telephone Encounter (Signed)
Pt calling stating she is having some new on set pneumonia  Stating her back is hurting like it would if she had it Upper back This just started this morning Please advise

## 2016-06-22 NOTE — Telephone Encounter (Signed)
Per DS pt is take Advil cold and sinus. Pt informed. Nothing further needed.

## 2016-06-22 NOTE — Telephone Encounter (Signed)
Patient calling to see if Dr. Sung AmabileSimonds has reccommended anything.

## 2016-06-22 NOTE — Telephone Encounter (Signed)
Pt seen last 05/23/16 by you. Pt denies any fever. Has some cough, runny nose, upper back pain and some chest pain. Please advise.

## 2016-08-27 ENCOUNTER — Telehealth: Payer: Self-pay | Admitting: Pulmonary Disease

## 2016-08-27 NOTE — Telephone Encounter (Signed)
Pt informed of response. Pt verbalized understanding. Nothing further needed.

## 2016-08-27 NOTE — Telephone Encounter (Signed)
Please advise if you want me to work pt in sooner that 3/20 with [facility] or refer to PCP until then.

## 2016-08-27 NOTE — Telephone Encounter (Signed)
Patient wants to be seen asap for back pain runny nose watery eyes cotton mouth .    Patient scheduled for 930 on 09/04/16 with Simonds for already due fu.  Patient would like to be added to the waitlist and worked into be seen sooner.

## 2016-08-27 NOTE — Telephone Encounter (Signed)
These symptoms by themselves do not appear to warrant an earlier evaluation.

## 2016-09-04 ENCOUNTER — Ambulatory Visit (INDEPENDENT_AMBULATORY_CARE_PROVIDER_SITE_OTHER): Payer: BLUE CROSS/BLUE SHIELD | Admitting: Pulmonary Disease

## 2016-09-04 ENCOUNTER — Encounter: Payer: Self-pay | Admitting: Pulmonary Disease

## 2016-09-04 VITALS — BP 112/68 | HR 80 | Wt 146.0 lb

## 2016-09-04 DIAGNOSIS — J449 Chronic obstructive pulmonary disease, unspecified: Secondary | ICD-10-CM

## 2016-09-04 DIAGNOSIS — Z9109 Other allergy status, other than to drugs and biological substances: Secondary | ICD-10-CM

## 2016-09-04 DIAGNOSIS — J301 Allergic rhinitis due to pollen: Secondary | ICD-10-CM | POA: Diagnosis not present

## 2016-09-04 NOTE — Patient Instructions (Addendum)
Continue Symbicort Continue albuterol as needed For allergy symptoms and mucus, recommend Zyrtec 10 mg daily  Take 1 hour before bedtime  When the pollen season is over, you can change to as needed Follow-up in 3-4 months

## 2016-09-05 NOTE — Progress Notes (Signed)
PROBLEMS: Hospitalization 11/17-11/18/16 for PNA Former smoker Chronic asthmatic bronchitis Normal PFTs 06/22/15  INTERVAL HISTORY: Recent URI. Resolved   SUBJ: Recent URI. Essentially back to her baseline but still with sense of mucus in her airways. Minimal cough and sputum though. Denies CP, fever, purulent sputum, hemoptysis, LE edema and calf tenderness.    OBJ: Vitals:   09/04/16 0923  BP: 112/68  Pulse: 80  SpO2: 96%  Weight: 146 lb (66.2 kg)   No overt respiratory distress HEENT WNL Slightly coarse BS without wheezes Reg, no M NABS, soft No C/C/E  DATA: BMP Latest Ref Rng & Units 04/24/2016 05/06/2015 05/05/2015  Glucose 65 - 99 mg/dL 737(T122(H) 062(I174(H) 948(N143(H)  BUN 6 - 20 mg/dL 13 12 11   Creatinine 0.44 - 1.00 mg/dL 4.620.52 7.030.58 5.000.52  Sodium 135 - 145 mmol/L 139 139 138  Potassium 3.5 - 5.1 mmol/L 4.1 3.8 3.8  Chloride 101 - 111 mmol/L 108 106 105  CO2 22 - 32 mmol/L 23 24 25   Calcium 8.9 - 10.3 mg/dL 9.0 9.0 9.1    CBC Latest Ref Rng & Units 04/24/2016 05/06/2015 05/05/2015  WBC 3.6 - 11.0 K/uL 9.7 12.1(H) 8.9  Hemoglobin 12.0 - 16.0 g/dL 93.815.2 18.212.7 99.313.7  Hematocrit 35.0 - 47.0 % 44.8 38.3 40.9  Platelets 150 - 440 K/uL 166 178 183   CXR: NNF   IMPRESSION: Chronic asthmatic bronchitis with mild acute exacerbation   PLAN: 1) Cont Symbicort 2) Continue Ventolin inhaler as needed 3) Recommend cetirizine for "allergy" symtpoms 4) Follow up in 3-4 months  Billy Fischeravid Tamikka Pilger, MD PCCM service Mobile [phone] Pager [phone] 09/05/2016

## 2016-11-01 ENCOUNTER — Encounter: Payer: Self-pay | Admitting: Emergency Medicine

## 2016-11-01 ENCOUNTER — Emergency Department
Admission: EM | Admit: 2016-11-01 | Discharge: 2016-11-01 | Disposition: A | Discharge status: Home / Self Care | Payer: BLUE CROSS/BLUE SHIELD | Attending: Emergency Medicine | Admitting: Emergency Medicine

## 2016-11-01 ENCOUNTER — Emergency Department: Payer: BLUE CROSS/BLUE SHIELD

## 2016-11-01 DIAGNOSIS — R110 Nausea: Secondary | ICD-10-CM | POA: Diagnosis not present

## 2016-11-01 DIAGNOSIS — R1032 Left lower quadrant pain: Secondary | ICD-10-CM | POA: Diagnosis present

## 2016-11-01 DIAGNOSIS — Z7982 Long term (current) use of aspirin: Secondary | ICD-10-CM | POA: Insufficient documentation

## 2016-11-01 DIAGNOSIS — J45909 Unspecified asthma, uncomplicated: Secondary | ICD-10-CM | POA: Insufficient documentation

## 2016-11-01 DIAGNOSIS — J449 Chronic obstructive pulmonary disease, unspecified: Secondary | ICD-10-CM | POA: Diagnosis not present

## 2016-11-01 DIAGNOSIS — Z87891 Personal history of nicotine dependence: Secondary | ICD-10-CM | POA: Insufficient documentation

## 2016-11-01 DIAGNOSIS — Z79899 Other long term (current) drug therapy: Secondary | ICD-10-CM | POA: Diagnosis not present

## 2016-11-01 LAB — COMPREHENSIVE METABOLIC PANEL
ALT: 18 U/L (ref 14–54)
AST: 24 U/L (ref 15–41)
Albumin: 3.9 g/dL (ref 3.5–5.0)
Alkaline Phosphatase: 90 U/L (ref 38–126)
Anion gap: 7 (ref 5–15)
BILIRUBIN TOTAL: 0.5 mg/dL (ref 0.3–1.2)
BUN: 14 mg/dL (ref 6–20)
CO2: 22 mmol/L (ref 22–32)
CREATININE: 0.59 mg/dL (ref 0.44–1.00)
Calcium: 9 mg/dL (ref 8.9–10.3)
Chloride: 111 mmol/L (ref 101–111)
GFR calc Af Amer: >60 mL/min (ref >60)
Glucose, Bld: 149 mg/dL — ABNORMAL HIGH (ref 65–99)
POTASSIUM: 3.6 mmol/L (ref 3.5–5.1)
Sodium: 140 mmol/L (ref 135–145)
TOTAL PROTEIN: 6.6 g/dL (ref 6.5–8.1)

## 2016-11-01 LAB — CBC
HCT: 39.1 % (ref 35.0–47.0)
Hemoglobin: 13.4 g/dL (ref 12.0–16.0)
MCH: 31.9 pg (ref 26.0–34.0)
MCHC: 34.3 g/dL (ref 32.0–36.0)
MCV: 92.8 fL (ref 80.0–100.0)
PLATELETS: 193 10*3/uL (ref 150–440)
RBC: 4.21 MIL/uL (ref 3.80–5.20)
RDW: 12 % (ref 11.5–14.5)
WBC: 7.3 10*3/uL (ref 3.6–11.0)

## 2016-11-01 LAB — URINALYSIS, COMPLETE (UACMP) WITH MICROSCOPIC
BILIRUBIN URINE: NEGATIVE
Bacteria, UA: NONE SEEN
Glucose, UA: NEGATIVE mg/dL
HGB URINE DIPSTICK: NEGATIVE
Ketones, ur: NEGATIVE mg/dL
LEUKOCYTES UA: NEGATIVE
NITRITE: NEGATIVE
PH: 5 (ref 5.0–8.0)
Protein, ur: NEGATIVE mg/dL
SPECIFIC GRAVITY, URINE: 1.014 (ref 1.005–1.030)

## 2016-11-01 LAB — LIPASE, BLOOD: Lipase: 29 U/L (ref 11–51)

## 2016-11-01 LAB — LACTIC ACID, PLASMA: Lactic Acid, Venous: 1.3 mmol/L (ref 0.5–1.9)

## 2016-11-01 MED ORDER — IOPAMIDOL (ISOVUE-300) INJECTION 61%
100.0000 mL | Freq: Once | INTRAVENOUS | Status: AC | PRN
  Administered 2016-11-01: 100 mL via INTRAVENOUS
  Filled 2016-11-01: qty 100

## 2016-11-01 NOTE — ED Triage Notes (Signed)
Pt reports LLQ abdominal pain that began today. Reports some nausea but denies vomiting and diarrhea. Denies dysuria. No apparent distress in triage.

## 2016-11-01 NOTE — ED Provider Notes (Signed)
[facility] Emergency Department Provider Note  ____________________________________________   First MD Initiated Contact with Patient 11/01/16 1631     (approximate)  I have reviewed the triage vital signs and the nursing notes.   HISTORY  Chief Complaint Abdominal Pain   HPI Sandy Eaton is a 76 y.o. female who self presents to the emergency department with an acute exacerbation of several months of left lower quadrant pain. Pain is aching moderate severity and intermittent. It happens almost every day. She's not sure what makes it come on or go. She saw her primary care physicianwho ordered an outpatient CT scan but the patient has yet to have the scan. She comes to the emergency department today because she was at work and noted worsening pain doubling her over.. Her pain is acutely worse today. Nothing seems to make it better or worse. Associated with nausea but no vomiting. No diarrhea no dysuria no frequency no hesitance.   Past Medical History:  Diagnosis Date  . Asthma   . COPD (chronic obstructive pulmonary disease) (HCC)    Suspected  . Environmental allergies   . GERD (gastroesophageal reflux disease)   . Hyperlipidemia     Patient Active Problem List   Diagnosis Date Noted  . Pneumonia 05/05/2015  . Hospital acquired PNA 11/26/2014  . SOB (shortness of breath) 02/24/2014  . DOE (dyspnea on exertion) 02/24/2014  . Cough 02/24/2014  . 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 ROTATOR CUFF REPAIR Right   . VAGINAL HYSTERECTOMY     1960's    Prior to Admission medications   Medication Sig Start Date End Date Taking? Authorizing Provider  albuterol (PROVENTIL HFA;VENTOLIN HFA) 108 (90 Base) MCG/ACT inhaler Inhale into the lungs every 6 (six) hours as needed for wheezing or shortness of breath.    [provider]  aspirin EC 81 MG tablet Take 81 mg by mouth daily.    [provider]  atorvastatin (LIPITOR) 40 MG tablet Take 40 mg by mouth at bedtime.     [provider]  benzonatate (TESSALON PERLES) 100 MG capsule Take 1 capsule (100 mg total) by mouth every 6 (six) hours as needed for cough. 05/23/16 05/23/17  Merwyn Katos, MD  budesonide-formoterol (SYMBICORT) 80-4.5 MCG/ACT inhaler Inhale 2 puffs into the lungs 2 (two) times daily. 05/23/16   Merwyn Katos, MD  cetirizine (ZYRTEC) 10 MG tablet Take 10 mg by mouth daily as needed for allergies.    [provider]  fluticasone (FLONASE) 50 MCG/ACT nasal spray Place 1 spray into both nostrils daily as needed for rhinitis.     [provider]  ipratropium-albuterol (DUONEB) 0.5-2.5 (3) MG/3ML SOLN Inhale into the lungs every 6 (six) hours as needed.     [provider]  omeprazole (PRILOSEC) 40 MG capsule Take 40 mg by mouth daily as needed.     [provider]    Allergies Augmentin [amoxicillin-pot clavulanate] and Valium [diazepam]  Family History  Problem Relation Age of Onset  . COPD Father   . Asthma Daughter   . Asthma Grandchild   . Cancer Daughter        breast  . Breast cancer Daughter     Social History Social History  Substance Use Topics  . Smoking status: Former Smoker    Packs/day: 1.00    Years: 30.00    Types: Cigarettes    Quit date: 02/24/2009  .  Smokeless tobacco: Never Used  . Alcohol use No    Review of Systems Constitutional: No fever/chills ENT: No sore throat. Cardiovascular: Denies chest pain. Respiratory: Denies shortness of breath. Gastrointestinal: Positive abdominal pain.  Positive nausea, no vomiting.  No diarrhea.  No constipation. Musculoskeletal: Negative for back pain. Neurological: Negative for headaches   ____________________________________________   PHYSICAL EXAM:  VITAL SIGNS: ED Triage Vitals  Enc Vitals Group     BP 11/01/16 1539 117/72     Pulse Rate 11/01/16 1539 92     Resp 11/01/16  1539 18     Temp 11/01/16 1539 98.6 F (37 C)     Temp Source 11/01/16 1539 Oral     SpO2 11/01/16 1539 96 %     Weight 11/01/16 1540 144 lb (65.3 kg)     Height 11/01/16 1540 5' 3.5" (1.613 m)     Head Circumference --      Peak Flow --      Pain Score 11/01/16 1539 6     Pain Loc --      Pain Edu? --      Excl. in GC? --     Constitutional: Alert and oriented x 4 well appearing nontoxic no diaphoresis speaks in full, clear sentences Head: Atraumatic. Nose: No congestion/rhinnorhea. Mouth/Throat: No trismus Neck: No stridor.   Cardiovascular: Regular rate and rhythm no murmurs Respiratory: Normal respiratory effort.  No retractions. Gastrointestinal: Soft nondistended mild tenderness left lower quadrant with no rebound no guarding no peritonitis Neurologic:  Normal speech and language. No gross focal neurologic deficits are appreciated.  Skin:  Skin is warm, dry and intact. No rash noted.    ____________________________________________  LABS (all labs ordered are listed, but only abnormal results are displayed)  Labs Reviewed  COMPREHENSIVE METABOLIC PANEL - Abnormal; Notable for the following:       Result Value   Glucose, Bld 149 (*)    All other components within normal limits  URINALYSIS, COMPLETE (UACMP) WITH MICROSCOPIC - Abnormal; Notable for the following:    Color, Urine YELLOW (*)    APPearance CLEAR (*)    Squamous Epithelial / LPF 0-5 (*)    All other components within normal limits  LIPASE, BLOOD  CBC  LACTIC ACID, PLASMA    Labs unremarkable __________________________________________  EKG   ____________________________________________  RADIOLOGY  CT scan negative for acute pathology and no etiology for her pain identified ____________________________________________   PROCEDURES  Procedure(s) performed: no  Procedures  Critical Care performed: no  Observation: no ____________________________________________   INITIAL IMPRESSION  / ASSESSMENT AND PLAN / ED COURSE  Pertinent labs & imaging results that were available during my care of the patient were reviewed by me and considered in my medical decision making (see chart for details).  The patient arrives with an acute exacerbation of her chronic left lower quadrant pain. Given her age and severity of symptoms I obtained a CT scan with IV contrast which is negative for acute pathology. I explained to the patient that significant diagnostic uncertainty remains in a do not have a clear reason for her pain that she does not require surgery antibiotics at this time and I feel she is stable for follow-up with her primary care physician within one week for recheck. She is discharged home in improved condition.      ____________________________________________   FINAL CLINICAL IMPRESSION(S) / ED DIAGNOSES  Final diagnoses:  Left lower quadrant pain      NEW MEDICATIONS STARTED  DURING THIS VISIT:  Discharge Medication List as of 11/01/2016  5:07 PM       Note:  This document was prepared using Dragon voice recognition software and may include unintentional dictation errors.      Merrily Brittle, MD 11/02/16 1201

## 2016-11-01 NOTE — Discharge Instructions (Signed)
Fortunately today you're CT scan and your labs were unremarkable, however I do not have a good explanation for your pain. Please follow-up with your primary care physician in one week for recheck and return to the emergency department sooner for any new or worsening symptoms.  It was a pleasure to take care of you today, and thank you for coming to our emergency department.  If you have any questions or concerns before leaving please ask the nurse to grab me and I'm more than happy to go through your aftercare instructions again.  If you were prescribed any opioid pain medication today such as Norco, Vicodin, Percocet, morphine, hydrocodone, or oxycodone please make sure you do not drive when you are taking this medication as it can alter your ability to drive safely.  If you have any concerns once you are home that you are not improving or are in fact getting worse before you can make it to your follow-up appointment, please do not hesitate to call 911 and come back for further evaluation.  Merrily Brittle MD  Results for orders placed or performed during the hospital encounter of 11/01/16  Lipase, blood  Result Value Ref Range   Lipase 29 11 - 51 U/L  Comprehensive metabolic panel  Result Value Ref Range   Sodium 140 135 - 145 mmol/L   Potassium 3.6 3.5 - 5.1 mmol/L   Chloride 111 101 - 111 mmol/L   CO2 22 22 - 32 mmol/L   Glucose, Bld 149 (H) 65 - 99 mg/dL   BUN 14 6 - 20 mg/dL   Creatinine, Ser 4.09 0.44 - 1.00 mg/dL   Calcium 9.0 8.9 - 81.1 mg/dL   Total Protein 6.6 6.5 - 8.1 g/dL   Albumin 3.9 3.5 - 5.0 g/dL   AST 24 15 - 41 U/L   ALT 18 14 - 54 U/L   Alkaline Phosphatase 90 38 - 126 U/L   Total Bilirubin 0.5 0.3 - 1.2 mg/dL   GFR calc non Af Amer >60 >60 mL/min   GFR calc Af Amer >60 >60 mL/min   Anion gap 7 5 - 15  CBC  Result Value Ref Range   WBC 7.3 3.6 - 11.0 K/uL   RBC 4.21 3.80 - 5.20 MIL/uL   Hemoglobin 13.4 12.0 - 16.0 g/dL   HCT 91.4 78.2 - 95.6 %   MCV 92.8  80.0 - 100.0 fL   MCH 31.9 26.0 - 34.0 pg   MCHC 34.3 32.0 - 36.0 g/dL   RDW 21.3 08.6 - 57.8 %   Platelets 193 150 - 440 K/uL  Urinalysis, Complete w Microscopic  Result Value Ref Range   Color, Urine YELLOW (A) YELLOW   APPearance CLEAR (A) CLEAR   Specific Gravity, Urine 1.014 1.005 - 1.030   pH 5.0 5.0 - 8.0   Glucose, UA NEGATIVE NEGATIVE mg/dL   Hgb urine dipstick NEGATIVE NEGATIVE   Bilirubin Urine NEGATIVE NEGATIVE   Ketones, ur NEGATIVE NEGATIVE mg/dL   Protein, ur NEGATIVE NEGATIVE mg/dL   Nitrite NEGATIVE NEGATIVE   Leukocytes, UA NEGATIVE NEGATIVE   RBC / HPF 0-5 0 - 5 RBC/hpf   WBC, UA 0-5 0 - 5 WBC/hpf   Bacteria, UA NONE SEEN NONE SEEN   Squamous Epithelial / LPF 0-5 (A) NONE SEEN   Mucous PRESENT   Lactic acid, plasma  Result Value Ref Range   Lactic Acid, Venous 1.3 0.5 - 1.9 mmol/L   Ct Abdomen Pelvis W Contrast  Result  Date: 11/01/2016 CLINICAL DATA:  Left lower quadrant pain.  Began today. EXAM: CT ABDOMEN AND PELVIS WITH CONTRAST TECHNIQUE: Multidetector CT imaging of the abdomen and pelvis was performed using the standard protocol following bolus administration of intravenous contrast. CONTRAST:  100mL ISOVUE-300 IOPAMIDOL (ISOVUE-300) INJECTION 61% COMPARISON:  11/26/2014 FINDINGS: Lower chest: No acute abnormality. Hepatobiliary: Hypodense, fluid attenuating left hepatic mass measuring 3 x 2.3 cm most consistent with a cyst. Liver is otherwise unremarkable. No gallstones, gallbladder wall thickening, or biliary dilatation. Pancreas: Unremarkable. No pancreatic ductal dilatation or surrounding inflammatory changes. Spleen: Normal in size without focal abnormality. Adrenals/Urinary Tract: Adrenal glands are unremarkable. Kidneys are normal, without renal calculi, focal lesion, or hydronephrosis. Bladder is unremarkable. Stomach/Bowel: No bowel dilatation or bowel wall thickening. No bowel obstruction. No pneumatosis, pneumoperitoneum or portal venous gas. The  appendix is not visualize. No abdominal or pelvic free fluid. Vascular/Lymphatic: No significant vascular findings are present. No enlarged abdominal or pelvic lymph nodes. Reproductive: Status post hysterectomy. No adnexal masses. Other: No abdominal wall hernia or abnormality. No abdominopelvic ascites. Musculoskeletal: No acute osseous abnormality. No lytic or sclerotic osseous lesion. 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 Minimal grade 1 anterolisthesis of L5 on S1 secondary to right facet arthropathy and left L5 pars interarticularis defect. Grade 1 anterolisthesis of L3 on L4 secondary to bilateral facet arthropathy. IMPRESSION: 1. No acute abdominal or pelvic pathology. No findings to explain the patient's left lower quadrant pain. Electronically Signed   By: Elige KoHetal  Patel   On: 11/01/2016 16:49

## 2016-11-01 NOTE — ED Notes (Signed)
Patient transported to CT 

## 2016-11-29 ENCOUNTER — Other Ambulatory Visit: Payer: Self-pay | Admitting: Family Medicine

## 2016-11-29 DIAGNOSIS — R1909 Other intra-abdominal and pelvic swelling, mass and lump: Secondary | ICD-10-CM

## 2016-12-05 ENCOUNTER — Ambulatory Visit: Payer: BLUE CROSS/BLUE SHIELD

## 2016-12-12 ENCOUNTER — Ambulatory Visit
Admission: RE | Admit: 2016-12-12 | Discharge: 2016-12-12 | Disposition: A | Discharge status: Home / Self Care | Payer: BLUE CROSS/BLUE SHIELD | Source: Ambulatory Visit | Attending: Family Medicine | Admitting: Family Medicine

## 2016-12-12 DIAGNOSIS — K4090 Unilateral inguinal hernia, without obstruction or gangrene, not specified as recurrent: Secondary | ICD-10-CM | POA: Insufficient documentation

## 2016-12-12 DIAGNOSIS — R935 Abnormal findings on diagnostic imaging of other abdominal regions, including retroperitoneum: Secondary | ICD-10-CM | POA: Diagnosis not present

## 2016-12-12 DIAGNOSIS — R1909 Other intra-abdominal and pelvic swelling, mass and lump: Secondary | ICD-10-CM

## 2017-01-07 ENCOUNTER — Ambulatory Visit (INDEPENDENT_AMBULATORY_CARE_PROVIDER_SITE_OTHER): Payer: BLUE CROSS/BLUE SHIELD | Admitting: Pulmonary Disease

## 2017-01-07 ENCOUNTER — Encounter: Payer: Self-pay | Admitting: Pulmonary Disease

## 2017-01-07 VITALS — BP 116/80 | HR 88 | Ht 64.0 in | Wt 144.0 lb

## 2017-01-07 DIAGNOSIS — Z87891 Personal history of nicotine dependence: Secondary | ICD-10-CM | POA: Diagnosis not present

## 2017-01-07 DIAGNOSIS — J449 Chronic obstructive pulmonary disease, unspecified: Secondary | ICD-10-CM

## 2017-01-07 NOTE — Progress Notes (Signed)
PROBLEMS: Hospitalization 11/17-11/18/16 for PNA Former smoker Chronic asthmatic bronchitis Normal PFTs 06/22/15  INTERVAL HISTORY: No major events  SUBJ: This is a routine reevaluation. Overall has been at her baseline. The recent hot and humid weather has been difficult for her. She continues to have intermittent nasal congestion and discharge. He is to have intermittent shortness of breath which is relieved by albuterol rescue inhaler. She is using albuterol 0-2 times per day. Denies CP, fever, purulent sputum, hemoptysis, LE edema and calf tenderness.  OBJ: Vitals:   01/07/17 0838  BP: 116/80  Pulse: 88  SpO2: 95%  Weight: 144 lb (65.3 kg)  Height: 5\' 4"  (1.626 m)   NAD HEENT WNL Slightly coarse BS without wheezes or other adventitious sounds Reg, no M NABS, soft No C/C/E  DATA: BMP Latest Ref Rng & Units 11/01/2016 04/24/2016 05/06/2015  Glucose 65 - 99 mg/dL 045(W149(H) 098(J122(H) 191(Y174(H)  BUN 6 - 20 mg/dL 14 13 12   Creatinine 0.44 - 1.00 mg/dL 7.820.59 9.560.52 2.130.58  Sodium 135 - 145 mmol/L 140 139 139  Potassium 3.5 - 5.1 mmol/L 3.6 4.1 3.8  Chloride 101 - 111 mmol/L 111 108 106  CO2 22 - 32 mmol/L 22 23 24   Calcium 8.9 - 10.3 mg/dL 9.0 9.0 9.0    CBC Latest Ref Rng & Units 11/01/2016 04/24/2016 05/06/2015  WBC 3.6 - 11.0 K/uL 7.3 9.7 12.1(H)  Hemoglobin 12.0 - 16.0 g/dL 08.613.4 57.815.2 46.912.7  Hematocrit 35.0 - 47.0 % 39.1 44.8 38.3  Platelets 150 - 440 K/uL 193 166 178   CXR: NNF   IMPRESSION: Former smoker with normal PFTs at baseline Chronic asthmatic bronchitis  PLAN: 1) Cont Symbicort 2) Continue Ventolin inhaler as needed 3) continue cetirizine for nasal congestion and discharge 4) follow-up in 4-6 months or sooner as needed  Billy Fischeravid Geoffry Bannister, MD PCCM service Mobile [phone] Pager [phone] 01/07/2017

## 2017-01-07 NOTE — Patient Instructions (Signed)
Continues Symbicort Continue albuterol (Ventolin) as needed Continue cetirizine (Zyrtec) as needed for nasal discharge and congestion Follow-up in 4-6 months or sooner as needed

## 2017-01-08 ENCOUNTER — Encounter
Admission: RE | Admit: 2017-01-08 | Discharge: 2017-01-08 | Disposition: A | Discharge status: Home / Self Care | Payer: BLUE CROSS/BLUE SHIELD | Source: Ambulatory Visit | Attending: Surgery | Admitting: Surgery

## 2017-01-08 DIAGNOSIS — Z01810 Encounter for preprocedural cardiovascular examination: Secondary | ICD-10-CM | POA: Insufficient documentation

## 2017-01-08 HISTORY — DX: Pneumonia, unspecified organism: J18.9

## 2017-01-08 NOTE — Patient Instructions (Addendum)
Your procedure is scheduled on:January 15, 2017 TUESDAY Report to Same Day Surgery on the 2nd floor in the [facility]. To find out your arrival time, please call [phone] between 1PM - 3PM on: MONDAY JULY 30,2018  REMEMBER: Instructions that are not followed completely may result in serious medical risk up to and including death; or upon the discretion of your surgeon and anesthesiologist your surgery may need to be rescheduled.  Do not eat food or drink liquids after midnight. No gum chewing or hard candies  No Alcohol for 24 hours before or after surgery.  No Smoking for 24 hours prior to surgery.  Notify your doctor if there is any change in your medical condition (cold, fever, infection).  Do not wear jewelry, make-up, hairpins, clips or nail polish.  Do not wear lotions, powders, or perfumes.   Do not shave 48 hours prior to surgery. Men may shave face and neck.  Contacts and dentures may not be worn into surgery.  Do not bring valuables to the hospital. [facility] is not responsible for any belongings or valuables.   TAKE THESE MEDICATIONS THE MORNING OF SURGERY WITH A SIP OF WATER: ZYRTEC FLONASE OMEPRAZOLE TAKE DOSE NIGHT BEFORE AND MORNING OF SURGERY     Use CHG Soap or wipes as directed on instruction sheet.  Use inhalers on the day of surgery and bring to the hospital..  Follow recommendations from Cardiologist, Pulmonologist or PCP regarding stopping Aspirin, Coumadin, Plavix, Eliquis, Pradaxa, or Pletal. NO ASPIRIN UNTIL AFTER SURGERY  Stop Anti-inflammatories such as Advil, Aleve, Ibuprofen, Motrin, Naproxen, Naprosyn, Goodie powder, or aspirin products. (May take Tylenol or Acetaminophen and Celebrex if needed.)  Stop supplements until after surgery. (May continue Vitamin D, Vitamin B, and multivitamin.)  If you are being admitted to the hospital overnight, leave your suitcase in the car. After surgery it may be brought to your room.  If you are  being discharged the day of surgery, you will not be allowed to drive home. You will need someone to drive you home and stay with you that night.   If you are taking public transportation, you will need to have a responsible adult to with you.      

## 2017-01-08 NOTE — Pre-Procedure Instructions (Signed)
Your procedure is scheduled on:January 15, 2017 TUESDAY Report to Same Day Surgery on the 2nd floor in the [facility]. To find out your arrival time, please call [phone] between 1PM - 3PM on: MONDAY JULY 30,2018  REMEMBER: Instructions that are not followed completely may result in serious medical risk up to and including death; or upon the discretion of your surgeon and anesthesiologist your surgery may need to be rescheduled.  Do not eat food or drink liquids after midnight. No gum chewing or hard candies  No Alcohol for 24 hours before or after surgery.  No Smoking for 24 hours prior to surgery.  Notify your doctor if there is any change in your medical condition (cold, fever, infection).  Do not wear jewelry, make-up, hairpins, clips or nail polish.  Do not wear lotions, powders, or perfumes.   Do not shave 48 hours prior to surgery. Men may shave face and neck.  Contacts and dentures may not be worn into surgery.  Do not bring valuables to the hospital. [facility] is not responsible for any belongings or valuables.   TAKE THESE MEDICATIONS THE MORNING OF SURGERY WITH A SIP OF WATER: ZYRTEC FLONASE OMEPRAZOLE TAKE DOSE NIGHT BEFORE AND MORNING OF SURGERY     Use CHG Soap or wipes as directed on instruction sheet.  Use inhalers on the day of surgery and bring to the hospital..  Follow recommendations from Cardiologist, Pulmonologist or PCP regarding stopping Aspirin, Coumadin, Plavix, Eliquis, Pradaxa, or Pletal. NO ASPIRIN UNTIL AFTER SURGERY  Stop Anti-inflammatories such as Advil, Aleve, Ibuprofen, Motrin, Naproxen, Naprosyn, Goodie powder, or aspirin products. (May take Tylenol or Acetaminophen and Celebrex if needed.)  Stop supplements until after surgery. (May continue Vitamin D, Vitamin B, and multivitamin.)  If you are being admitted to the hospital overnight, leave your suitcase in the car. After surgery it may be brought to your room.  If you are  being discharged the day of surgery, you will not be allowed to drive home. You will need someone to drive you home and stay with you that night.   If you are taking public transportation, you will need to have a responsible adult to with you.

## 2017-01-15 ENCOUNTER — Encounter: Payer: Self-pay | Admitting: *Deleted

## 2017-01-15 ENCOUNTER — Ambulatory Visit
Admission: RE | Admit: 2017-01-15 | Discharge: 2017-01-15 | Disposition: A | Discharge status: Home / Self Care | Payer: BLUE CROSS/BLUE SHIELD | Source: Ambulatory Visit | Attending: Surgery | Admitting: Surgery

## 2017-01-15 ENCOUNTER — Encounter
Admission: RE | Disposition: A | Discharge status: Home / Self Care | Payer: Self-pay | Source: Ambulatory Visit | Attending: Surgery

## 2017-01-15 ENCOUNTER — Ambulatory Visit: Payer: BLUE CROSS/BLUE SHIELD | Admitting: Registered Nurse

## 2017-01-15 DIAGNOSIS — Z87891 Personal history of nicotine dependence: Secondary | ICD-10-CM | POA: Insufficient documentation

## 2017-01-15 DIAGNOSIS — Z7982 Long term (current) use of aspirin: Secondary | ICD-10-CM | POA: Diagnosis not present

## 2017-01-15 DIAGNOSIS — Z79899 Other long term (current) drug therapy: Secondary | ICD-10-CM | POA: Insufficient documentation

## 2017-01-15 DIAGNOSIS — Z833 Family history of diabetes mellitus: Secondary | ICD-10-CM | POA: Insufficient documentation

## 2017-01-15 DIAGNOSIS — H409 Unspecified glaucoma: Secondary | ICD-10-CM | POA: Diagnosis not present

## 2017-01-15 DIAGNOSIS — K4190 Unilateral femoral hernia, without obstruction or gangrene, not specified as recurrent: Secondary | ICD-10-CM | POA: Insufficient documentation

## 2017-01-15 DIAGNOSIS — H9313 Tinnitus, bilateral: Secondary | ICD-10-CM | POA: Diagnosis not present

## 2017-01-15 DIAGNOSIS — K219 Gastro-esophageal reflux disease without esophagitis: Secondary | ICD-10-CM | POA: Insufficient documentation

## 2017-01-15 DIAGNOSIS — Z90710 Acquired absence of both cervix and uterus: Secondary | ICD-10-CM | POA: Diagnosis not present

## 2017-01-15 DIAGNOSIS — J449 Chronic obstructive pulmonary disease, unspecified: Secondary | ICD-10-CM | POA: Diagnosis not present

## 2017-01-15 DIAGNOSIS — Z89429 Acquired absence of other toe(s), unspecified side: Secondary | ICD-10-CM | POA: Diagnosis not present

## 2017-01-15 DIAGNOSIS — R0609 Other forms of dyspnea: Secondary | ICD-10-CM | POA: Diagnosis not present

## 2017-01-15 DIAGNOSIS — E785 Hyperlipidemia, unspecified: Secondary | ICD-10-CM | POA: Diagnosis not present

## 2017-01-15 HISTORY — PX: FEMORAL HERNIA REPAIR: SHX632

## 2017-01-15 SURGERY — HERNIA REPAIR FEMORAL
Anesthesia: General | Laterality: Right | Wound class: Clean

## 2017-01-15 MED ORDER — MIDAZOLAM HCL 2 MG/2ML IJ SOLN
INTRAMUSCULAR | Status: DC | PRN
  Administered 2017-01-15: 2 mg via INTRAVENOUS

## 2017-01-15 MED ORDER — HYDROCODONE-ACETAMINOPHEN 5-325 MG PO TABS: 1.0000 | ORAL_TABLET | ORAL | 0 refills | Status: DC | PRN

## 2017-01-15 MED ORDER — CEFAZOLIN SODIUM-DEXTROSE 2-4 GM/100ML-% IV SOLN
2.0000 g | Freq: Once | INTRAVENOUS | Status: AC
  Administered 2017-01-15: 2 g via INTRAVENOUS

## 2017-01-15 MED ORDER — ONDANSETRON HCL 4 MG/2ML IJ SOLN
INTRAMUSCULAR | Status: AC
  Filled 2017-01-15: qty 2

## 2017-01-15 MED ORDER — GLYCOPYRROLATE 0.2 MG/ML IJ SOLN
INTRAMUSCULAR | Status: AC
  Filled 2017-01-15: qty 1

## 2017-01-15 MED ORDER — IPRATROPIUM-ALBUTEROL 0.5-2.5 (3) MG/3ML IN SOLN
RESPIRATORY_TRACT | Status: AC
  Administered 2017-01-15: 3 mL via RESPIRATORY_TRACT
  Filled 2017-01-15: qty 3

## 2017-01-15 MED ORDER — BUPIVACAINE-EPINEPHRINE (PF) 0.5% -1:200000 IJ SOLN
INTRAMUSCULAR | Status: AC
  Filled 2017-01-15: qty 30

## 2017-01-15 MED ORDER — BUPIVACAINE-EPINEPHRINE (PF) 0.5% -1:200000 IJ SOLN
INTRAMUSCULAR | Status: DC | PRN
  Administered 2017-01-15: 7 mL via PERINEURAL

## 2017-01-15 MED ORDER — HYDROCODONE-ACETAMINOPHEN 5-325 MG PO TABS
ORAL_TABLET | ORAL | Status: AC
  Filled 2017-01-15: qty 1

## 2017-01-15 MED ORDER — IPRATROPIUM-ALBUTEROL 0.5-2.5 (3) MG/3ML IN SOLN
3.0000 mL | Freq: Once | RESPIRATORY_TRACT | Status: AC
  Administered 2017-01-15: 3 mL via RESPIRATORY_TRACT

## 2017-01-15 MED ORDER — LIDOCAINE HCL (CARDIAC) 20 MG/ML IV SOLN
INTRAVENOUS | Status: DC | PRN
  Administered 2017-01-15: 80 mg via INTRAVENOUS

## 2017-01-15 MED ORDER — FENTANYL CITRATE (PF) 100 MCG/2ML IJ SOLN
25.0000 ug | INTRAMUSCULAR | Status: DC | PRN
  Administered 2017-01-15 (×2): 25 ug via INTRAVENOUS

## 2017-01-15 MED ORDER — FENTANYL CITRATE (PF) 100 MCG/2ML IJ SOLN
INTRAMUSCULAR | Status: AC
  Filled 2017-01-15: qty 2

## 2017-01-15 MED ORDER — DEXAMETHASONE SODIUM PHOSPHATE 10 MG/ML IJ SOLN
INTRAMUSCULAR | Status: DC | PRN
  Administered 2017-01-15: 10 mg via INTRAVENOUS

## 2017-01-15 MED ORDER — SUCCINYLCHOLINE CHLORIDE 20 MG/ML IJ SOLN
INTRAMUSCULAR | Status: AC
  Filled 2017-01-15: qty 1

## 2017-01-15 MED ORDER — FENTANYL CITRATE (PF) 100 MCG/2ML IJ SOLN
INTRAMUSCULAR | Status: DC | PRN
  Administered 2017-01-15 (×2): 50 ug via INTRAVENOUS

## 2017-01-15 MED ORDER — MIDAZOLAM HCL 2 MG/2ML IJ SOLN
INTRAMUSCULAR | Status: AC
  Filled 2017-01-15: qty 2

## 2017-01-15 MED ORDER — ONDANSETRON HCL 4 MG/2ML IJ SOLN
INTRAMUSCULAR | Status: DC | PRN
  Administered 2017-01-15: 4 mg via INTRAVENOUS

## 2017-01-15 MED ORDER — FENTANYL CITRATE (PF) 100 MCG/2ML IJ SOLN
INTRAMUSCULAR | Status: AC
  Administered 2017-01-15: 25 ug via INTRAVENOUS
  Filled 2017-01-15: qty 2

## 2017-01-15 MED ORDER — EPHEDRINE SULFATE 50 MG/ML IJ SOLN
INTRAMUSCULAR | Status: AC
  Filled 2017-01-15: qty 1

## 2017-01-15 MED ORDER — PROPOFOL 10 MG/ML IV BOLUS
INTRAVENOUS | Status: AC
  Filled 2017-01-15: qty 20

## 2017-01-15 MED ORDER — PROPOFOL 10 MG/ML IV BOLUS
INTRAVENOUS | Status: DC | PRN
  Administered 2017-01-15: 150 mg via INTRAVENOUS
  Administered 2017-01-15: 20 mg via INTRAVENOUS

## 2017-01-15 MED ORDER — ROCURONIUM BROMIDE 50 MG/5ML IV SOLN
INTRAVENOUS | Status: AC
  Filled 2017-01-15: qty 2

## 2017-01-15 MED ORDER — HYDROCODONE-ACETAMINOPHEN 5-325 MG PO TABS
1.0000 | ORAL_TABLET | ORAL | Status: DC | PRN
  Administered 2017-01-15: 1 via ORAL

## 2017-01-15 MED ORDER — DEXAMETHASONE SODIUM PHOSPHATE 10 MG/ML IJ SOLN
INTRAMUSCULAR | Status: AC
  Filled 2017-01-15: qty 1

## 2017-01-15 MED ORDER — PHENYLEPHRINE HCL 10 MG/ML IJ SOLN
INTRAMUSCULAR | Status: DC | PRN
  Administered 2017-01-15 (×5): 100 ug via INTRAVENOUS
  Administered 2017-01-15: 200 ug via INTRAVENOUS
  Administered 2017-01-15 (×3): 100 ug via INTRAVENOUS

## 2017-01-15 MED ORDER — ONDANSETRON HCL 4 MG/2ML IJ SOLN: 4.0000 mg | Freq: Once | INTRAMUSCULAR | Status: DC | PRN

## 2017-01-15 MED ORDER — PHENYLEPHRINE HCL 10 MG/ML IJ SOLN
INTRAMUSCULAR | Status: AC
  Filled 2017-01-15: qty 1

## 2017-01-15 MED ORDER — GLYCOPYRROLATE 0.2 MG/ML IJ SOLN
INTRAMUSCULAR | Status: DC | PRN
  Administered 2017-01-15: 0.2 mg via INTRAVENOUS

## 2017-01-15 MED ORDER — LIDOCAINE HCL (PF) 2 % IJ SOLN
INTRAMUSCULAR | Status: AC
  Filled 2017-01-15: qty 4

## 2017-01-15 MED ORDER — LACTATED RINGERS IV SOLN
INTRAVENOUS | Status: DC
  Administered 2017-01-15: 07:00:00 via INTRAVENOUS

## 2017-01-15 MED ORDER — CEFAZOLIN SODIUM-DEXTROSE 2-4 GM/100ML-% IV SOLN
INTRAVENOUS | Status: AC
  Filled 2017-01-15: qty 100

## 2017-01-15 SURGICAL SUPPLY — 25 items
BLADE SURG 15 STRL LF DISP TIS (BLADE) ×1 IMPLANT
BLADE SURG 15 STRL SS (BLADE) ×2
CANISTER SUCT 1200ML W/VALVE (MISCELLANEOUS) ×3 IMPLANT
CHLORAPREP W/TINT 26ML (MISCELLANEOUS) ×3 IMPLANT
DERMABOND ADVANCED (GAUZE/BANDAGES/DRESSINGS) ×2
DERMABOND ADVANCED .7 DNX12 (GAUZE/BANDAGES/DRESSINGS) ×1 IMPLANT
DRAIN PENROSE 5/8X18 LTX STRL (WOUND CARE) IMPLANT
DRAPE LAPAROTOMY 77X122 PED (DRAPES) ×3 IMPLANT
ELECT REM PT RETURN 9FT ADLT (ELECTROSURGICAL) ×3
ELECTRODE REM PT RTRN 9FT ADLT (ELECTROSURGICAL) ×1 IMPLANT
GLOVE BIO SURGEON STRL SZ7.5 (GLOVE) ×6 IMPLANT
GOWN STRL REUS W/ TWL LRG LVL3 (GOWN DISPOSABLE) ×2 IMPLANT
GOWN STRL REUS W/TWL LRG LVL3 (GOWN DISPOSABLE) ×4
KIT RM TURNOVER STRD PROC AR (KITS) ×3 IMPLANT
LABEL OR SOLS (LABEL) ×3 IMPLANT
NEEDLE HYPO 25X1 1.5 SAFETY (NEEDLE) ×3 IMPLANT
NS IRRIG 500ML POUR BTL (IV SOLUTION) ×3 IMPLANT
PACK BASIN MINOR ARMC (MISCELLANEOUS) ×3 IMPLANT
SLEEVE PROTECTION STRL DISP (MISCELLANEOUS) ×3 IMPLANT
SUT CHROMIC 4 0 RB 1X27 (SUTURE) ×3 IMPLANT
SUT MNCRL AB 4-0 PS2 18 (SUTURE) ×3 IMPLANT
SUT SURGILON 0 30 BLK (SUTURE) ×6 IMPLANT
SUT VIC AB 4-0 SH 27 (SUTURE)
SUT VIC AB 4-0 SH 27XANBCTRL (SUTURE) IMPLANT
SYRINGE 10CC LL (SYRINGE) ×3 IMPLANT

## 2017-01-15 NOTE — Anesthesia Postprocedure Evaluation (Signed)
Anesthesia Post Note  Patient: Sandy Eaton  Procedure(s) Performed: Procedure(s) (LRB): HERNIA REPAIR FEMORAL (Right)  Patient location during evaluation: PACU Anesthesia Type: General Level of consciousness: awake and alert Pain management: pain level controlled Vital Signs Assessment: post-procedure vital signs reviewed and stable Respiratory status: spontaneous breathing, nonlabored ventilation, respiratory function stable and patient connected to nasal cannula oxygen Cardiovascular status: blood pressure returned to baseline and stable Postop Assessment: no signs of nausea or vomiting Anesthetic complications: no     Last Vitals:  Vitals:   01/15/17 1041 01/15/17 1057  BP:  94/61  Pulse: 70 76  Resp: 13 16  Temp: 36.7 C (!) 36.1 C    Last Pain:  Vitals:   01/15/17 1057  TempSrc:   PainSc: 8                  Yevette EdwardsJames G Calah Gershman

## 2017-01-15 NOTE — Op Note (Signed)
OPERATIVE REPORT  PREOPERATIVE  DIAGNOSIS: . Right femoral hernia  POSTOPERATIVE DIAGNOSIS: . Right femoral hernia  PROCEDURE: . Right femoral hernia repair  ANESTHESIA:  General  SURGEON: Renda RollsWilton Smith  MD   INDICATIONS: . She is a history of bulging and pain in the right groin. A right femoral hernia was demonstrated on physical exam and repair was recommended for definitive treatment.  With the patient on the operating table in the supine position under general anesthesia the right groin was prepared with ChloraPrep and draped in a sterile manner. There was a palpable bulge just below the inguinal ligament. A transversely oriented suprapubic incision was made extending beyond the border of the inguinal ligament. Dissection was carried down through subcutaneous tissues. Electrocautery was used for hemostasis. Scarpa's fascia was incised. There was a femoral hernia sac which was encountered. This sac was dissected free from surrounding tissues down to a defect which was just posterior to the shelving edge of the inguinal ligament. The defect was small. The defect was dilated with a Kelly clamp. The sac was opened. Its continuity with the peritoneal cavity was demonstrated. Incarcerated omentum was reduced back into the abdominal cavity and also the sac was reduced as well. The repair was carried out by placing a series of 3 sutures of 0 Surgilon suturing the Cooper's ligament to the shelving edge of the inguinal ligament. After all sutures were placed they were then tied obliterating the defect. The subcuticular tissues were infiltrated with half percent Sensorcaine with epinephrine. Scarpa's fascia was closed with interrupted 4-0 chromic. The skin was closed with running 4-0 Monocryl subcuticular suture and Dermabond. The patient tolerated surgery satisfactorily and was prepared for transfer to the recovery room  [facility] Center[facility]ilton Smith M.D.

## 2017-01-15 NOTE — Anesthesia Procedure Notes (Signed)
Procedure Name: LMA Insertion Date/Time: 01/15/2017 7:39 AM Performed by: Stormy FabianURTIS, Bayron Dalto Pre-anesthesia Checklist: Patient identified, Patient being monitored, Timeout performed, Emergency Drugs available and Suction available Patient Re-evaluated:Patient Re-evaluated prior to induction Oxygen Delivery Method: Circle system utilized Preoxygenation: Pre-oxygenation with 100% oxygen Induction Type: IV induction Ventilation: Mask ventilation without difficulty LMA: LMA inserted LMA Size: 3.5 Tube type: Oral Number of attempts: 1 Placement Confirmation: positive ETCO2 and breath sounds checked- equal and bilateral Tube secured with: Tape Dental Injury: Teeth and Oropharynx as per pre-operative assessment

## 2017-01-15 NOTE — Transfer of Care (Signed)
Immediate Anesthesia Transfer of Care Note  Patient: Sandy CordsDreama J Bruna  Procedure(s) Performed: Procedure(s): HERNIA REPAIR FEMORAL (Right)  Patient Location: PACU  Anesthesia Type:General  Level of Consciousness: sedated  Airway & Oxygen Therapy: Patient Spontanous Breathing and Patient connected to face mask oxygen  Post-op Assessment: Report given to RN and Post -op Vital signs reviewed and stable  Post vital signs: Reviewed and stable  Last Vitals:  Vitals:   01/15/17 0618 01/15/17 0853  BP: 115/82 101/81  Pulse: 84 77  Resp: 14 17  Temp: 36.6 C 36.7 C    Complications: No apparent anesthesia complications

## 2017-01-15 NOTE — Anesthesia Post-op Follow-up Note (Cosign Needed)
Anesthesia QCDR form completed.        

## 2017-01-15 NOTE — H&P (Signed)
She comes for right femoral hernia repair.  She reports no change in condition since office exam.  Labs noted.  Right side marked YES.    Discussed plan for surgery.

## 2017-01-15 NOTE — Anesthesia Preprocedure Evaluation (Signed)
Anesthesia Evaluation  Patient identified by MRN, date of birth, ID band Patient awake    Reviewed: Allergy & Precautions, H&P , NPO status , Patient's Chart, lab work & pertinent test results, reviewed documented beta blocker date and time   Airway Mallampati: II  TM Distance: >3 FB Neck ROM: full    Dental  (+) Teeth Intact   Pulmonary neg pulmonary ROS, neg shortness of breath, asthma , pneumonia, resolved, COPD,  COPD inhaler, former smoker,    Pulmonary exam normal        Cardiovascular Exercise Tolerance: Poor + DOE  negative cardio ROS Normal cardiovascular exam Rate:Normal     Neuro/Psych negative neurological ROS  negative psych ROS   GI/Hepatic negative GI ROS, Neg liver ROS, GERD  Medicated,  Endo/Other  negative endocrine ROS  Renal/GU negative Renal ROS  negative genitourinary   Musculoskeletal   Abdominal   Peds  Hematology negative hematology ROS (+)   Anesthesia Other Findings   Reproductive/Obstetrics negative OB ROS                             Anesthesia Physical Anesthesia Plan  ASA: III  Anesthesia Plan: General LMA   Post-op Pain Management:    Induction:   PONV Risk Score and Plan: 4 or greater and Ondansetron, Dexamethasone, Midazolam and Propofol infusion  Airway Management Planned:   Additional Equipment:   Intra-op Plan:   Post-operative Plan:   Informed Consent: I have reviewed the patients History and Physical, chart, labs and discussed the procedure including the risks, benefits and alternatives for the proposed anesthesia with the patient or authorized representative who has indicated his/her understanding and acceptance.     Plan Discussed with: CRNA  Anesthesia Plan Comments:         Anesthesia Quick Evaluation

## 2017-01-15 NOTE — Discharge Instructions (Signed)
Take Tylenol or Norco if needed for pain. ° °Should not drive or do anything dangerous when taking Norco. ° °May shower and blot dry. ° °Avoid straining and heavy lifting. °

## 2017-03-14 ENCOUNTER — Other Ambulatory Visit: Payer: Self-pay | Admitting: Family Medicine

## 2017-03-14 DIAGNOSIS — Z1231 Encounter for screening mammogram for malignant neoplasm of breast: Secondary | ICD-10-CM

## 2017-07-08 ENCOUNTER — Ambulatory Visit: Payer: BLUE CROSS/BLUE SHIELD | Admitting: Pulmonary Disease

## 2017-07-09 ENCOUNTER — Encounter: Payer: Self-pay | Admitting: Pulmonary Disease

## 2017-07-09 ENCOUNTER — Ambulatory Visit (INDEPENDENT_AMBULATORY_CARE_PROVIDER_SITE_OTHER): Payer: BLUE CROSS/BLUE SHIELD | Admitting: Pulmonary Disease

## 2017-07-09 VITALS — BP 110/80 | HR 80 | Ht 63.0 in | Wt 148.0 lb

## 2017-07-09 DIAGNOSIS — J449 Chronic obstructive pulmonary disease, unspecified: Secondary | ICD-10-CM | POA: Diagnosis not present

## 2017-07-09 MED ORDER — BENZONATATE 100 MG PO CAPS: 100.0000 mg | ORAL_CAPSULE | Freq: Three times a day (TID) | ORAL | 1 refills | Status: DC | PRN

## 2017-07-09 NOTE — Progress Notes (Signed)
PROBLEMS: Hospitalization 11/17-11/18/16 for PNA Former smoker Chronic asthmatic bronchitis Normal PFTs 06/22/15  INTERVAL HISTORY: No major events  SUBJ: This is a routine reevaluation.  There have been no major events and no significant change in her breathing status overall..  She remains on Symbicort and uses albuterol as needed.  She uses albuterol 0-2 times per day.  She has mild chronic cough with minimal sputum production. Denies CP, fever, purulent sputum, hemoptysis, LE edema and calf tenderness. This morning, she suffered a fall (tripped over her cat) with trauma to the right side of her face.  She did not strike her head or lose consciousness.  .  OBJ: Vitals:   07/09/17 1045 07/09/17 1049  BP:  110/80  Pulse:  80  SpO2:  95%  Weight: 67.1 kg (148 lb)   Height: 5\' 3"  (1.6 m)   Room air  No respiratory distress HEENT purple contusion on her right cheek Breath sounds full without adventitious sounds Reg, no M NABS, soft No C/C/E  DATA: BMP Latest Ref Rng & Units 11/01/2016 04/24/2016 05/06/2015  Glucose 65 - 99 mg/dL 295(A149(H) 213(Y122(H) 865(H174(H)  BUN 6 - 20 mg/dL 14 13 12   Creatinine 0.44 - 1.00 mg/dL 8.460.59 9.620.52 9.520.58  Sodium 135 - 145 mmol/L 140 139 139  Potassium 3.5 - 5.1 mmol/L 3.6 4.1 3.8  Chloride 101 - 111 mmol/L 111 108 106  CO2 22 - 32 mmol/L 22 23 24   Calcium 8.9 - 10.3 mg/dL 9.0 9.0 9.0    CBC Latest Ref Rng & Units 11/01/2016 04/24/2016 05/06/2015  WBC 3.6 - 11.0 K/uL 7.3 9.7 12.1(H)  Hemoglobin 12.0 - 16.0 g/dL 84.113.4 32.415.2 40.112.7  Hematocrit 35.0 - 47.0 % 39.1 44.8 38.3  Platelets 150 - 440 K/uL 193 166 178   CXR: NNF   IMPRESSION: Former smoker with normal PFTs at baseline Chronic asthmatic bronchitis  PLAN: 1) Cont Symbicort 2) Continue Ventolin inhaler as needed 3) continue cetirizine for nasal congestion and discharge 4) I recommended that she use an ice pack on her right cheek to reduce swelling with and acetaminophen or ibuprofen as needed for  pain 5) follow-up in 6 months or sooner as needed  Billy Fischeravid Simonds, MD [facility] service Mobile [phone] Pager [phone] 07/09/2017 11:22 AM

## 2017-07-09 NOTE — Patient Instructions (Addendum)
1) Cont Symbicort 2) Continue Ventolin inhaler as needed 3) continue cetirizine  4) for your cheek contusion, I would try an ice pack to reduce the swelling and use Tylenol or Advil as needed for pain  5) follow-up in 6 months or sooner as needed

## 2017-07-10 ENCOUNTER — Ambulatory Visit
Admission: RE | Admit: 2017-07-10 | Discharge: 2017-07-10 | Disposition: A | Discharge status: Home / Self Care | Payer: BLUE CROSS/BLUE SHIELD | Source: Ambulatory Visit | Attending: Family Medicine | Admitting: Family Medicine

## 2017-07-10 DIAGNOSIS — Z1231 Encounter for screening mammogram for malignant neoplasm of breast: Secondary | ICD-10-CM | POA: Diagnosis not present

## 2017-10-14 ENCOUNTER — Ambulatory Visit (INDEPENDENT_AMBULATORY_CARE_PROVIDER_SITE_OTHER): Payer: BLUE CROSS/BLUE SHIELD | Admitting: Pulmonary Disease

## 2017-10-14 ENCOUNTER — Encounter: Payer: Self-pay | Admitting: Pulmonary Disease

## 2017-10-14 VITALS — BP 110/80 | HR 83 | Ht 63.0 in | Wt 148.0 lb

## 2017-10-14 DIAGNOSIS — J4531 Mild persistent asthma with (acute) exacerbation: Secondary | ICD-10-CM | POA: Diagnosis not present

## 2017-10-14 DIAGNOSIS — Z87891 Personal history of nicotine dependence: Secondary | ICD-10-CM

## 2017-10-14 DIAGNOSIS — J449 Chronic obstructive pulmonary disease, unspecified: Secondary | ICD-10-CM

## 2017-10-14 MED ORDER — PREDNISONE 20 MG PO TABS: 40.0000 mg | ORAL_TABLET | Freq: Every day | ORAL | 0 refills | Status: AC

## 2017-10-14 NOTE — Patient Instructions (Signed)
Prednisone 40 mg daily (2 tablets) for 5 days Continue Symbicort inhaler Continue albuterol rescue inhaler as first-line rescue medication Continue nebulizer as second line rescue medication Call us if symptoms do not resolve with the above interventions Follow-up in 3 months

## 2017-10-14 NOTE — Progress Notes (Signed)
PROBLEMS: Hospitalization 11/17-11/18/16 for PNA Former smoker Chronic asthmatic bronchitis Normal PFTs 06/22/15  INTERVAL HISTORY: No major events  SUBJ: This is an acute evaluation.  She is usually well maintained on Symbicort inhaler with albuterol rescue inhaler which she uses 0-2X/day.  However, over the past couple of days, attributed to increased pollens in the environment, she has had increased cough, hoarseness, chest tightness and left-sided posterior thoracic pain.  Her cough has been minimally productive.  She denies fever and hemoptysis.  She has had no purulent sputum.  She is presently using her rescue inhaler at least twice per day.  She also use the nebulized bronchodilators twice on the day prior to this evaluation  OBJ: Vitals:   10/14/17 1105  BP: 110/80  Pulse: 83  SpO2: 100%  Weight: 148 lb (67.1 kg)  Height:  (1.6 m)  Room air  Gen: No overt distress, worse voice quality HEENT: NCAT, sclera white Neck: No JVD Lungs: breath sounds full and slightly coarse with no true wheezes or other adventitious sounds Cardiovascular: RRR, no murmurs Abdomen: Soft, nontender, normal BS Ext: without clubbing, cyanosis, edema Neuro: grossly intact Skin: Limited exam, no lesions noted   DATA: BMP Latest Ref Rng & Units 11/01/2016 04/24/2016 05/06/2015  Glucose 65 - 99 mg/dL 409(W) 119(J) 478(G)  BUN 6 - 20 mg/dL Creatinine 0.44 - 1.00 mg/dL 9.56 2.13 0.86  Sodium 135 - 145 mmol/L 140 139 139  Potassium 3.5 - 5.1 mmol/L 3.6 4.1 3.8  Chloride 101 - 111 mmol/L 111 108 106  CO2 22 - 32 mmol/L Calcium 8.9 - 10.3 mg/dL 9.0 9.0 9.0    CBC Latest Ref Rng & Units 11/01/2016 04/24/2016 05/06/2015  WBC 3.6 - 11.0 K/uL 7.3 9.7 12.1(H)  Hemoglobin 12.0 - 16.0 g/dL 57.8 46.9 62.9  Hematocrit 35.0 - 47.0 % 39.1 44.8 38.3  Platelets 150 - 440 K/uL 193 166 178   CXR: NNF   IMPRESSION: Chronic asthmatic bronchitis (HCC)  Mild persistent asthma with  exacerbation  Former smoker with normal baseline PFTs    PLAN: Prednisone 40 mg daily (2 tablets) for 5 days Continue Symbicort inhaler Continue albuterol rescue inhaler as first-line rescue medication Continue nebulizer as second line rescue medication Call us if symptoms do not resolve with the above interventions Follow-up in 3 months  Billy Fischer, MD PCCM service Mobile [phone] Pager [phone] 10/14/2017 11:24 AM

## 2017-10-22 ENCOUNTER — Telehealth: Payer: Self-pay | Admitting: Pulmonary Disease

## 2017-10-22 DIAGNOSIS — J449 Chronic obstructive pulmonary disease, unspecified: Secondary | ICD-10-CM

## 2017-10-22 DIAGNOSIS — R079 Chest pain, unspecified: Secondary | ICD-10-CM

## 2017-10-22 NOTE — Telephone Encounter (Signed)
Pt calling stating she is still not feeling better She states she has a pain in back on left side She states she has a runny nose along with stuffy nose  Would like to know what needs to be done now   Please call back

## 2017-10-22 NOTE — Telephone Encounter (Signed)
Pt seen by DS on 10/14/17 and was given below.  IMPRESSION: Chronic asthmatic bronchitis (HCC)  Mild persistent asthma with exacerbation  Former smoker with normal baseline PFTs    PLAN: Prednisone 40 mg daily (2 tablets) for 5 days Continue Symbicort inhaler Continue albuterol rescue inhaler as first-line rescue medication Continue nebulizer as second line rescue medication Call us if symptoms do not resolve with the above interventions Follow-up in 3 months    Pt states; Pt calling stating she is still not feeling better She states she has a pain in back on left side She states she has a runny nose along with stuffy nose   Please advise.

## 2017-10-24 NOTE — Telephone Encounter (Signed)
Pt states she can't do CXR today and that she will do it tomorrow morning. Nothing further needed.

## 2017-10-24 NOTE — Telephone Encounter (Signed)
I have placed order for CXR. Ask her to get this today. I will review it and decide what to do next  [facility]

## 2017-10-25 ENCOUNTER — Ambulatory Visit
Admission: RE | Admit: 2017-10-25 | Discharge: 2017-10-25 | Disposition: A | Discharge status: Home / Self Care | Payer: BLUE CROSS/BLUE SHIELD | Source: Ambulatory Visit | Attending: Pulmonary Disease | Admitting: Pulmonary Disease

## 2017-10-25 DIAGNOSIS — R079 Chest pain, unspecified: Secondary | ICD-10-CM | POA: Diagnosis present

## 2017-10-25 DIAGNOSIS — I700 Atherosclerosis of aorta: Secondary | ICD-10-CM | POA: Insufficient documentation

## 2017-10-25 DIAGNOSIS — J449 Chronic obstructive pulmonary disease, unspecified: Secondary | ICD-10-CM

## 2017-10-25 DIAGNOSIS — K449 Diaphragmatic hernia without obstruction or gangrene: Secondary | ICD-10-CM | POA: Diagnosis not present

## 2017-11-19 ENCOUNTER — Ambulatory Visit
Admission: RE | Admit: 2017-11-19 | Discharge: 2017-11-19 | Disposition: A | Discharge status: Home / Self Care | Payer: BLUE CROSS/BLUE SHIELD | Source: Ambulatory Visit | Attending: Medical Oncology | Admitting: Medical Oncology

## 2017-11-19 ENCOUNTER — Other Ambulatory Visit: Payer: Self-pay | Admitting: Medical Oncology

## 2017-11-19 DIAGNOSIS — R0789 Other chest pain: Secondary | ICD-10-CM

## 2017-11-19 DIAGNOSIS — I700 Atherosclerosis of aorta: Secondary | ICD-10-CM | POA: Insufficient documentation

## 2017-12-18 ENCOUNTER — Ambulatory Visit: Payer: BLUE CROSS/BLUE SHIELD | Admitting: Pulmonary Disease

## 2017-12-24 ENCOUNTER — Ambulatory Visit (INDEPENDENT_AMBULATORY_CARE_PROVIDER_SITE_OTHER): Payer: BLUE CROSS/BLUE SHIELD | Admitting: Pulmonary Disease

## 2017-12-24 ENCOUNTER — Encounter: Payer: Self-pay | Admitting: Pulmonary Disease

## 2017-12-24 VITALS — BP 120/72 | HR 78 | Ht 63.0 in | Wt 147.0 lb

## 2017-12-24 DIAGNOSIS — J398 Other specified diseases of upper respiratory tract: Secondary | ICD-10-CM | POA: Diagnosis not present

## 2017-12-24 DIAGNOSIS — J449 Chronic obstructive pulmonary disease, unspecified: Secondary | ICD-10-CM

## 2017-12-24 MED ORDER — ALBUTEROL SULFATE 108 (90 BASE) MCG/ACT IN AEPB: 2.0000 | INHALATION_SPRAY | RESPIRATORY_TRACT | 3 refills | Status: DC | PRN

## 2017-12-24 MED ORDER — FLUTTER DEVI: 0 refills | Status: DC

## 2017-12-24 NOTE — Patient Instructions (Addendum)
Continue Symbicort inhaler Continue albuterol rescue inhaler as first-line rescue medication Continue nebulizer as second line rescue medication Use flutter valve to help mobilize secretions Follow up in 4-6 months

## 2017-12-24 NOTE — Progress Notes (Signed)
PROBLEMS: Hospitalization 11/17-11/18/16 for PNA Former smoker Chronic asthmatic bronchitis Normal PFTs 06/22/15  INTERVAL HISTORY: No major events  SUBJ: This is a scheduled follow-up visit.  She remains on Symbicort inhaler.  She is using her albuterol inhaler approximately 4-5 times per week.  She also has a nebulizer which she might use a couple times per week.  Her chief complaint is significant mucus production which is sometimes difficult to expectorate.  The mucus is described as white or clear.  She denies CP, fever, purulent sputum, hemoptysis, LE edema and calf tenderness . OBJ: Vitals:   12/24/17 0947 12/24/17 0952  BP:  120/72  Pulse:  78  SpO2:  98%  Weight: 147 lb (66.7 kg)   Height: 5\' 3"  (1.6 m)   Room air  Gen: NAD HEENT: NCAT, sclera white Neck: No JVD Lungs: breath sounds full, no wheezes or other adventitious sounds Cardiovascular: RRR, no murmurs Abdomen: Soft, nontender, normal BS Ext: without clubbing, cyanosis, edema Neuro: grossly intact Skin: Limited exam, no lesions noted    DATA: BMP Latest Ref Rng & Units 11/01/2016 04/24/2016 05/06/2015  Glucose 65 - 99 mg/dL 161(W149(H) 960(A122(H) 540(J174(H)  BUN 6 - 20 mg/dL 14 13 12   Creatinine 0.44 - 1.00 mg/dL 8.110.59 9.140.52 7.820.58  Sodium 135 - 145 mmol/L 140 139 139  Potassium 3.5 - 5.1 mmol/L 3.6 4.1 3.8  Chloride 101 - 111 mmol/L 111 108 106  CO2 22 - 32 mmol/L 22 23 24   Calcium 8.9 - 10.3 mg/dL 9.0 9.0 9.0    CBC Latest Ref Rng & Units 11/01/2016 04/24/2016 05/06/2015  WBC 3.6 - 11.0 K/uL 7.3 9.7 12.1(H)  Hemoglobin 12.0 - 16.0 g/dL 95.613.4 21.315.2 08.612.7  Hematocrit 35.0 - 47.0 % 39.1 44.8 38.3  Platelets 150 - 440 K/uL 193 166 178   CXR: NNF   IMPRESSION: Chronic asthmatic bronchitis (HCC)  Chronic mucus hypersecretion, respiratory   PLAN: Continue Symbicort inhaler Continue albuterol rescue inhaler as first-line rescue medication Continue nebulizer as second line rescue medication Flutter valve ordered. Use  flutter valve to help mobilize secretions Follow up in 4-6 months   Billy Fischeravid Maie Kesinger, MD PCCM service Mobile [phone] Pager [phone] 12/24/2017 1:49 PM

## 2018-04-28 ENCOUNTER — Other Ambulatory Visit: Payer: Self-pay | Admitting: Family Medicine

## 2018-04-28 ENCOUNTER — Ambulatory Visit
Admission: RE | Admit: 2018-04-28 | Discharge: 2018-04-28 | Disposition: A | Discharge status: Home / Self Care | Payer: BLUE CROSS/BLUE SHIELD | Source: Ambulatory Visit | Attending: Family Medicine | Admitting: Family Medicine

## 2018-04-28 DIAGNOSIS — M546 Pain in thoracic spine: Secondary | ICD-10-CM | POA: Diagnosis not present

## 2018-05-28 ENCOUNTER — Ambulatory Visit (INDEPENDENT_AMBULATORY_CARE_PROVIDER_SITE_OTHER): Payer: BLUE CROSS/BLUE SHIELD | Admitting: Pulmonary Disease

## 2018-05-28 ENCOUNTER — Encounter: Payer: Self-pay | Admitting: Pulmonary Disease

## 2018-05-28 VITALS — BP 120/80 | HR 83 | Ht 63.0 in | Wt 144.2 lb

## 2018-05-28 DIAGNOSIS — J398 Other specified diseases of upper respiratory tract: Secondary | ICD-10-CM | POA: Diagnosis not present

## 2018-05-28 DIAGNOSIS — J449 Chronic obstructive pulmonary disease, unspecified: Secondary | ICD-10-CM | POA: Diagnosis not present

## 2018-05-28 MED ORDER — FLUTTER DEVI: 1.0000 | Freq: Two times a day (BID) | 0 refills | Status: DC

## 2018-05-28 NOTE — Progress Notes (Signed)
PROBLEMS: Hospitalization 11/17-11/18/16 for PNA Former smoker Chronic asthmatic bronchitis Normal PFTs 06/22/15  INTERVAL HISTORY: Last visit 12/24/2017.  No major events  SUBJ: This is a scheduled follow-up.  She remains on Symbicort inhaler and is using it compliantly.  She continues to use her albuterol inhaler 4-5 times per week.  She continues to use her nebulized albuterol 1-2 times per week.  She continues to complain of intermittent significant mucus production which she describes as white and thick.  She continues to denies CP, fever, purulent sputum, hemoptysis, LE edema and calf tenderness .  OBJ: Vitals:   05/28/18 1123 05/28/18 1124  BP:  120/80  Pulse:  83  SpO2:  96%  Weight: 144 lb 3.2 oz (65.4 kg)   Height: 5\' 3"  (1.6 m)   Room air  Gen: NAD HEENT: NCAT, sclera white Neck: No JVD Lungs: breath sounds full, no wheezes or other adventitious sounds Cardiovascular: RRR, no murmurs Abdomen: Soft, nontender, normal BS Ext: without clubbing, cyanosis, edema Neuro: grossly intact Skin: Limited exam, no lesions noted     DATA: BMP Latest Ref Rng & Units 11/01/2016 04/24/2016 05/06/2015  Glucose 65 - 99 mg/dL 782(N149(H) 562(Z122(H) 308(M174(H)  BUN 6 - 20 mg/dL 14 13 12   Creatinine 0.44 - 1.00 mg/dL 5.780.59 4.690.52 6.290.58  Sodium 135 - 145 mmol/L 140 139 139  Potassium 3.5 - 5.1 mmol/L 3.6 4.1 3.8  Chloride 101 - 111 mmol/L 111 108 106  CO2 22 - 32 mmol/L 22 23 24   Calcium 8.9 - 10.3 mg/dL 9.0 9.0 9.0    CBC Latest Ref Rng & Units 11/01/2016 04/24/2016 05/06/2015  WBC 3.6 - 11.0 K/uL 7.3 9.7 12.1(H)  Hemoglobin 12.0 - 16.0 g/dL 52.813.4 41.315.2 24.412.7  Hematocrit 35.0 - 47.0 % 39.1 44.8 38.3  Platelets 150 - 440 K/uL 193 166 178   CXR 04/28/18: No acute cardiac or pulmonary findings   IMPRESSION: Chronic asthmatic bronchitis (HCC)  Chronic mucus hypersecretion, respiratory   PLAN: Continue Symbicort inhaler Continue albuterol rescue inhaler as first-line rescue medication Continue  nebulizer as second line rescue medication Flutter valve provided.  She is instructed to use it at least twice a day to help mobilize secretions Follow up in 6 months   Billy Fischeravid , MD PCCM service Mobile [phone] Pager [phone] 05/28/2018 2:33 PM

## 2018-05-28 NOTE — Patient Instructions (Signed)
Continue Symbicort inhaler Continue albuterol rescue inhaler as first-line rescue medication Continue nebulizer as second line rescue medication Flutter valve ordered. Use flutter valve at least twice a day to help mobilize secretions Follow up in 6 months

## 2018-06-09 ENCOUNTER — Emergency Department: Payer: BLUE CROSS/BLUE SHIELD

## 2018-06-09 ENCOUNTER — Inpatient Hospital Stay
Admission: EM | Admit: 2018-06-09 | Discharge: 2018-06-11 | DRG: 192 | Disposition: A | Discharge status: Home / Self Care | Payer: BLUE CROSS/BLUE SHIELD | Attending: Family Medicine | Admitting: Family Medicine

## 2018-06-09 ENCOUNTER — Encounter: Payer: Self-pay | Admitting: *Deleted

## 2018-06-09 ENCOUNTER — Other Ambulatory Visit: Payer: Self-pay

## 2018-06-09 DIAGNOSIS — Z7982 Long term (current) use of aspirin: Secondary | ICD-10-CM | POA: Diagnosis not present

## 2018-06-09 DIAGNOSIS — E876 Hypokalemia: Secondary | ICD-10-CM | POA: Diagnosis present

## 2018-06-09 DIAGNOSIS — Z87891 Personal history of nicotine dependence: Secondary | ICD-10-CM | POA: Diagnosis not present

## 2018-06-09 DIAGNOSIS — Z803 Family history of malignant neoplasm of breast: Secondary | ICD-10-CM

## 2018-06-09 DIAGNOSIS — Z880 Allergy status to penicillin: Secondary | ICD-10-CM | POA: Diagnosis not present

## 2018-06-09 DIAGNOSIS — E079 Disorder of thyroid, unspecified: Secondary | ICD-10-CM

## 2018-06-09 DIAGNOSIS — Z825 Family history of asthma and other chronic lower respiratory diseases: Secondary | ICD-10-CM

## 2018-06-09 DIAGNOSIS — E0789 Other specified disorders of thyroid: Secondary | ICD-10-CM

## 2018-06-09 DIAGNOSIS — J441 Chronic obstructive pulmonary disease with (acute) exacerbation: Secondary | ICD-10-CM | POA: Diagnosis not present

## 2018-06-09 DIAGNOSIS — R9431 Abnormal electrocardiogram [ECG] [EKG]: Secondary | ICD-10-CM | POA: Diagnosis present

## 2018-06-09 DIAGNOSIS — J029 Acute pharyngitis, unspecified: Secondary | ICD-10-CM | POA: Diagnosis present

## 2018-06-09 DIAGNOSIS — K219 Gastro-esophageal reflux disease without esophagitis: Secondary | ICD-10-CM | POA: Diagnosis present

## 2018-06-09 DIAGNOSIS — R0902 Hypoxemia: Secondary | ICD-10-CM | POA: Diagnosis present

## 2018-06-09 DIAGNOSIS — E041 Nontoxic single thyroid nodule: Secondary | ICD-10-CM | POA: Diagnosis present

## 2018-06-09 DIAGNOSIS — Z90710 Acquired absence of both cervix and uterus: Secondary | ICD-10-CM

## 2018-06-09 DIAGNOSIS — R05 Cough: Secondary | ICD-10-CM | POA: Diagnosis not present

## 2018-06-09 DIAGNOSIS — Z885 Allergy status to narcotic agent status: Secondary | ICD-10-CM

## 2018-06-09 DIAGNOSIS — E785 Hyperlipidemia, unspecified: Secondary | ICD-10-CM | POA: Diagnosis present

## 2018-06-09 DIAGNOSIS — Z79899 Other long term (current) drug therapy: Secondary | ICD-10-CM | POA: Diagnosis not present

## 2018-06-09 DIAGNOSIS — Z7951 Long term (current) use of inhaled steroids: Secondary | ICD-10-CM | POA: Diagnosis not present

## 2018-06-09 DIAGNOSIS — R51 Headache: Secondary | ICD-10-CM | POA: Diagnosis present

## 2018-06-09 DIAGNOSIS — Z79891 Long term (current) use of opiate analgesic: Secondary | ICD-10-CM

## 2018-06-09 DIAGNOSIS — R059 Cough, unspecified: Secondary | ICD-10-CM

## 2018-06-09 DIAGNOSIS — Z8701 Personal history of pneumonia (recurrent): Secondary | ICD-10-CM

## 2018-06-09 DIAGNOSIS — J449 Chronic obstructive pulmonary disease, unspecified: Secondary | ICD-10-CM

## 2018-06-09 LAB — BASIC METABOLIC PANEL
Anion gap: 10 (ref 5–15)
BUN: 14 mg/dL (ref 8–23)
CALCIUM: 8.6 mg/dL — AB (ref 8.9–10.3)
CO2: 20 mmol/L — AB (ref 22–32)
CREATININE: 0.49 mg/dL (ref 0.44–1.00)
Chloride: 107 mmol/L (ref 98–111)
GFR calc non Af Amer: >60 mL/min (ref >60)
GLUCOSE: 150 mg/dL — AB (ref 70–99)
Potassium: 3.4 mmol/L — ABNORMAL LOW (ref 3.5–5.1)
Sodium: 137 mmol/L (ref 135–145)

## 2018-06-09 LAB — CBC
HCT: 41.4 % (ref 36.0–46.0)
Hemoglobin: 13.8 g/dL (ref 12.0–15.0)
MCH: 31.9 pg (ref 26.0–34.0)
MCHC: 33.3 g/dL (ref 30.0–36.0)
MCV: 95.8 fL (ref 80.0–100.0)
NRBC: 0 % (ref 0.0–0.2)
PLATELETS: 167 10*3/uL (ref 150–400)
RBC: 4.32 MIL/uL (ref 3.87–5.11)
RDW: 11.9 % (ref 11.5–15.5)
WBC: 4 10*3/uL (ref 4.0–10.5)

## 2018-06-09 LAB — TROPONIN I: Troponin I: <0.03 ng/mL (ref <0.03)

## 2018-06-09 LAB — FIBRIN DERIVATIVES D-DIMER (ARMC ONLY): Fibrin derivatives D-dimer (ARMC): 588.63 ng/mL (FEU) — ABNORMAL HIGH (ref 0.00–499.00)

## 2018-06-09 MED ORDER — DOXYCYCLINE HYCLATE 100 MG PO TABS
100.0000 mg | ORAL_TABLET | Freq: Two times a day (BID) | ORAL | Status: DC
  Administered 2018-06-10 – 2018-06-11 (×3): 100 mg via ORAL
  Filled 2018-06-09 (×3): qty 1

## 2018-06-09 MED ORDER — IPRATROPIUM-ALBUTEROL 0.5-2.5 (3) MG/3ML IN SOLN
3.0000 mL | Freq: Once | RESPIRATORY_TRACT | Status: AC
  Administered 2018-06-09: 3 mL via RESPIRATORY_TRACT
  Filled 2018-06-09: qty 3

## 2018-06-09 MED ORDER — METHYLPREDNISOLONE SODIUM SUCC 125 MG IJ SOLR
125.0000 mg | Freq: Once | INTRAMUSCULAR | Status: AC
  Administered 2018-06-09: 125 mg via INTRAVENOUS
  Filled 2018-06-09: qty 2

## 2018-06-09 MED ORDER — IOPAMIDOL (ISOVUE-370) INJECTION 76%
75.0000 mL | Freq: Once | INTRAVENOUS | Status: AC | PRN
  Administered 2018-06-09: 100 mL via INTRAVENOUS

## 2018-06-09 MED ORDER — BUTALBITAL-APAP-CAFFEINE 50-325-40 MG PO TABS
1.0000 | ORAL_TABLET | Freq: Once | ORAL | Status: AC
  Administered 2018-06-09: 1 via ORAL
  Filled 2018-06-09: qty 1

## 2018-06-09 NOTE — H&P (Signed)
[employer] [facility]   PATIENT NAME: Sandy Eaton    MR#:  [id number]  DATE OF BIRTH:  August 14, 1940  DATE OF ADMISSION:  06/09/2018  PRIMARY CARE PHYSICIAN: Titus Mould, NP   REQUESTING/REFERRING PHYSICIAN:   CHIEF COMPLAINT:   Chief Complaint  Patient presents with  . Cough  . Back Pain    HISTORY OF PRESENT ILLNESS: Sandy Eaton  is a 77 y.o. female with a known history per below presents emergency room with several week history of generalized weakness, fatigue, productive cough, worsening shortness of breath, dyspnea on exertion, fevers, chills, night sweats, ill feeling, in the emergency room patient was found to be tachycardic, tachypneic, hypoxic with O2 saturation in the 80s, potassium 3.4, chest x-ray negative for any acute process, CT chest noted for 1.7 cm thyroid lesion/negative for PE, patient evaluated in the emergency room, family at the bedside, patient is now been admitted for acute on chronic COPD/asthmatic exacerbation.  PAST MEDICAL HISTORY:   Past Medical History:  Diagnosis Date  . Asthma   . COPD (chronic obstructive pulmonary disease) (HCC)    Suspected  . Environmental allergies   . GERD (gastroesophageal reflux disease)   . Hyperlipidemia   . Pneumonia    3 times    PAST SURGICAL HISTORY:  Past Surgical History:  Procedure Laterality Date  . APPENDECTOMY    . FEMORAL HERNIA REPAIR Right 01/15/2017   Procedure: HERNIA REPAIR FEMORAL;  Surgeon: Nadeen Landau, MD;  Location: [facility] ORS;  Service: General;  Laterality: Right;  . ROTATOR CUFF REPAIR Right   . TONSILLECTOMY    . VAGINAL HYSTERECTOMY     1960's    SOCIAL HISTORY:  Social History   Tobacco Use  . Smoking status: Former Smoker    Packs/day: 1.00    Years: 30.00    Pack years: 30.00    Types: Cigarettes    Last attempt to quit: 02/24/2009    Years since quitting: 9.2  . Smokeless tobacco: Never Used  Substance Use Topics  . Alcohol  use: No    FAMILY HISTORY:  Family History  Problem Relation Age of Onset  . COPD Father   . Asthma Daughter   . Asthma Grandchild   . Cancer Daughter        breast  . Breast cancer Daughter     DRUG ALLERGIES:  Allergies  Allergen Reactions  . Augmentin [Amoxicillin-Pot Clavulanate] Nausea And Vomiting    Has patient had a PCN reaction causing immediate rash, facial/tongue/throat swelling, SOB or lightheadedness with hypotension: No Has patient had a PCN reaction causing severe rash involving mucus membranes or skin necrosis: No Has patient had a PCN reaction that required hospitalization: No Has patient had a PCN reaction occurring within the last 10 years: No If all of the above answers are "NO", then may proceed with Cephalosporin use.   . Valium [Diazepam] Other (See Comments)    Reaction:  Hallucinations     REVIEW OF SYSTEMS:   CONSTITUTIONAL: +fever, fatigue, weakness.  EYES: No blurred or double vision.  EARS, NOSE, AND THROAT: No tinnitus or ear pain.  RESPIRATORY: + cough, shortness of breath, wheezing  CARDIOVASCULAR: No chest pain, orthopnea, edema.  GASTROINTESTINAL: No nausea, vomiting, diarrhea or abdominal pain.  GENITOURINARY: No dysuria, hematuria.  ENDOCRINE: No polyuria, nocturia,  HEMATOLOGY: No anemia, easy bruising or bleeding SKIN: No rash or lesion. MUSCULOSKELETAL: No joint pain or arthritis.   NEUROLOGIC: No tingling, numbness,  weakness.  PSYCHIATRY: No anxiety or depression.   MEDICATIONS AT HOME:  Prior to Admission medications   Medication Sig Start Date End Date Taking? Authorizing Provider  acetaminophen (TYLENOL) 500 MG tablet Take 1,000 mg by mouth 2 (two) times daily as needed for mild pain or headache.    [provider]  albuterol (PROVENTIL) (2.5 MG/3ML) 0.083% nebulizer solution Take 2.5 mg by nebulization every 6 (six) hours as needed for wheezing or shortness of breath.    [provider]  Albuterol Sulfate  (PROAIR RESPICLICK) 108 (90 Base) MCG/ACT AEPB Inhale 2 puffs into the lungs every 4 (four) hours as needed. 12/24/17   Merwyn KatosSimonds, David B, MD  aspirin EC 81 MG tablet Take 81 mg by mouth daily.    [provider]  atorvastatin (LIPITOR) 40 MG tablet Take 40 mg by mouth at bedtime.     [provider]  benzonatate (TESSALON) 100 MG capsule Take 1 capsule (100 mg total) by mouth 3 (three) times daily as needed for cough. 07/09/17   Merwyn KatosSimonds, David B, MD  budesonide-formoterol (SYMBICORT) 80-4.5 MCG/ACT inhaler Inhale 2 puffs into the lungs 2 (two) times daily. 05/23/16   Merwyn KatosSimonds, David B, MD  cetirizine (ZYRTEC) 10 MG tablet Take 10 mg by mouth daily.     [provider]  diphenhydrAMINE (BENADRYL) 25 MG tablet Take 25 mg by mouth at bedtime as needed.    [provider]  fluticasone (FLONASE) 50 MCG/ACT nasal spray Place 1 spray into both nostrils daily as needed for rhinitis.     [provider]  HYDROcodone-acetaminophen (NORCO) 5-325 MG tablet Take 1-2 tablets by mouth every 4 (four) hours as needed for moderate pain. 01/15/17   Nadeen LandauSmith, Jarvis Wilton, MD  omeprazole (PRILOSEC) 40 MG capsule Take 40 mg by mouth daily.     [provider]  Respiratory Therapy Supplies (FLUTTER) DEVI Use 10-15 times daily 12/24/17   Merwyn KatosSimonds, David B, MD  Respiratory Therapy Supplies (FLUTTER) DEVI 1 each by Does not apply route 2 (two) times daily. 05/28/18   Merwyn KatosSimonds, David B, MD  tetrahydrozoline-zinc (VISINE-AC) 0.05-0.25 % ophthalmic solution Place 1 drop into both eyes 3 (three) times daily as needed (irritated eyes).    [provider]      PHYSICAL EXAMINATION:   VITAL SIGNS: Blood pressure 117/76, pulse (!) 111, temperature 98.5 F (36.9 C), temperature source Oral, resp. rate (!) 25, height 5\' 3"  (1.6 m), weight 65.4 kg, SpO2 90 %.  GENERAL:  77 y.o.-year-old patient lying in the bed with no acute distress.  Nontoxic-appearing EYES: Pupils equal,  round, reactive to light and accommodation. No scleral icterus. Extraocular muscles intact.  HEENT: Head atraumatic, normocephalic. Oropharynx and nasopharynx clear.  NECK:  Supple, no jugular venous distention. No thyroid enlargement, no tenderness.  LUNGS: Diminished breath sounds throughout with rhonchi/wheezing. No use of accessory muscles of respiration.  CARDIOVASCULAR: S1, S2 normal. No murmurs, rubs, or gallops.  ABDOMEN: Soft, nontender, nondistended. Bowel sounds present. No organomegaly or mass.  EXTREMITIES: No pedal edema, cyanosis, or clubbing.  NEUROLOGIC: Cranial nerves II through XII are intact. Muscle strength 5/5 in all extremities. Sensation intact. Gait not checked.  PSYCHIATRIC: The patient is alert and oriented x 3.  SKIN: No obvious rash, lesion, or ulcer.   LABORATORY PANEL:   CBC Recent Labs  Lab 06/09/18 1916  WBC 4.0  HGB 13.8  HCT 41.4  PLT 167  MCV 95.8  MCH 31.9  MCHC 33.3  RDW 11.9   ------------------------------------------------------------------------------------------------------------------  Chemistries  Recent Labs  Lab 06/09/18 1916  NA 137  K 3.4*  CL 107  CO2 20*  GLUCOSE 150*  BUN 14  CREATININE 0.49  CALCIUM 8.6*   ------------------------------------------------------------------------------------------------------------------ estimated creatinine clearance is 53.6 mL/min (by C-G formula based on SCr of 0.49 mg/dL). ------------------------------------------------------------------------------------------------------------------ No results for input(s): TSH, T4TOTAL, T3FREE, THYROIDAB in the last 72 hours.  Invalid input(s): FREET3   Coagulation profile No results for input(s): INR, PROTIME in the last 168 hours. ------------------------------------------------------------------------------------------------------------------- No results for input(s): DDIMER in the last 72  hours. -------------------------------------------------------------------------------------------------------------------  Cardiac Enzymes Recent Labs  Lab 06/09/18 1916  TROPONINI <0.03   ------------------------------------------------------------------------------------------------------------------ Invalid input(s): POCBNP  ---------------------------------------------------------------------------------------------------------------  Urinalysis    Component Value Date/Time   COLORURINE YELLOW (A) 11/01/2016 1540   APPEARANCEUR CLEAR (A) 11/01/2016 1540   LABSPEC 1.014 11/01/2016 1540   PHURINE 5.0 11/01/2016 1540   GLUCOSEU NEGATIVE 11/01/2016 1540   HGBUR NEGATIVE 11/01/2016 1540   BILIRUBINUR NEGATIVE 11/01/2016 1540   KETONESUR NEGATIVE 11/01/2016 1540   PROTEINUR NEGATIVE 11/01/2016 1540   NITRITE NEGATIVE 11/01/2016 1540   LEUKOCYTESUR NEGATIVE 11/01/2016 1540     RADIOLOGY: Dg Chest 2 View  Result Date: 06/09/2018 CLINICAL DATA:  77 year old female with cough and back pain since last week. EXAM: CHEST - 2 VIEW COMPARISON:  Chest radiographs 04/28/2018 and earlier. FINDINGS: No cardiomegaly. Mediastinal contours are stable and within normal limits; paraspinal fat seen by CT in 2016 seems to simulate the presence of a small chronic hiatal hernia. Visualized tracheal air column is within normal limits. No pneumothorax, pulmonary edema, pleural effusion or confluent pulmonary opacity. Stable mild increased pulmonary interstitial markings. Stable visualized osseous structures. Negative visible bowel gas pattern. IMPRESSION: No acute cardiopulmonary abnormality. Electronically Signed   By: Odessa Fleming M.D.   On: 06/09/2018 19:42   Ct Angio Chest Pe W And/or Wo Contrast  Result Date: 06/09/2018 CLINICAL DATA:  Acute onset of cough and back pain. Elevated D-dimer. Shortness of breath. EXAM: CT ANGIOGRAPHY CHEST WITH CONTRAST TECHNIQUE: Multidetector CT imaging of the chest was  performed using the standard protocol during bolus administration of intravenous contrast. Multiplanar CT image reconstructions and MIPs were obtained to evaluate the vascular anatomy. CONTRAST:  ISOVUE-370 IOPAMIDOL (ISOVUE-370) INJECTION 76% COMPARISON:  Chest radiograph performed earlier today at 7:16 p.m., and CT of the chest performed 04/06/2015 FINDINGS: Cardiovascular:  There is no evidence of pulmonary embolus. The heart is normal in size. The thoracic aorta is grossly unremarkable, aside from mild calcification at the aortic arch. The great vessels are within normal limits. Mediastinum/Nodes: The mediastinum is unremarkable in appearance. No mediastinal lymphadenopathy is seen. No pericardial effusion is identified. A 1.7 cm hypodensity is noted at the left thyroid lobe. No axillary lymphadenopathy is seen. Lungs/Pleura: Mild bibasilar atelectasis is noted. No pleural effusion or pneumothorax is seen. No masses are identified. Upper Abdomen: A 3.2 cm cystic focus within the liver and increased gradually in size from 2016, and may reflect a simple cyst. The visualized portions of the liver and spleen are otherwise unremarkable. The visualized portions of the gallbladder, pancreas, adrenal glands and kidneys are within normal limits. Musculoskeletal: No acute osseous abnormalities are identified. The visualized musculature is unremarkable in appearance. Review of the MIP images confirms the above findings. IMPRESSION: 1. No evidence of pulmonary embolus. 2. Mild bibasilar atelectasis noted. 3. 1.7 cm hypodensity at the left thyroid lobe. Consider further evaluation with thyroid ultrasound. If patient is clinically hyperthyroid, consider nuclear medicine thyroid uptake  and scan. 4. 3.2 cm cystic focus within the liver has increased only gradually in size from 2016, and may reflect a simple cyst. Electronically Signed   By: Roanna RaiderJeffery  Chang M.D.   On: 06/09/2018 23:10    EKG: Orders placed or performed  during the hospital encounter of 06/09/18  . ED EKG  . ED EKG    IMPRESSION AND PLAN: *Acute on COPD/asthmatic exacerbation *Chronic GERD without esophagitis *Acute hypokalemia *Acute thyroid lesion *Chronic hyperlipidemia  Admit to regular nursing floor bed, IV Solu-Medrol with tapering as tolerated, aggressive pulmonary toilet with bronchodilator therapy, respiratory therapy to see, mucolytic agents, empiric doxycycline for 5-day course, supplemental oxygen with weaning as tolerated, IV fluids for rehydration, check sputum cultures, PPI daily, replete potassium, check magnesium in the morning, check thyroid ultrasound for further evaluation, thyroid function panel, check lipids in the morning, and continue close medical monitoring  All the records are reviewed and case discussed with ED provider. Management plans discussed with the patient, family and they are in agreement.  CODE STATUS:full Code Status History    Date Active Date Inactive Code Status Order ID Comments User Context   05/05/2015 0918 05/06/2015 1913 Full Code [id number]  Katha HammingKonidena, Snehalatha, MD ED   11/26/2014 1757 11/29/2014 1732 Full Code [id number]  Ramonita LabGouru, Aruna, MD Inpatient       TOTAL TIME TAKING CARE OF THIS PATIENT: 40 minutes.    Evelena AsaMontell D Salary M.D on 06/09/2018   Between 7am to 6pm - Pager - [phone]  After 6pm go to [email or web address] - Social research officer, governmentpassword [email or web address]  [facility]  Office  [phone]  CC: Primary care physician; White, Arlyss RepressElizabeth Burney, NP   Note: This dictation was prepared with Dragon dictation along with smaller phrase technology. Any transcriptional errors that result from this process are unintentional.

## 2018-06-09 NOTE — Progress Notes (Signed)
Family Meeting Note  Advance Directive:yes  Today a meeting took place with the Patient.  Patient is able to participate   The following clinical team members were present during this meeting:MD  The following were discussed:Patient's diagnosis: copd, Patient's progosis: Unable to determine and Goals for treatment: Full Code  Additional follow-up to be provided: prn  Time spent during discussion:20 minutes  Sandy Bergen D Chayah Mckee, MD  

## 2018-06-09 NOTE — ED Provider Notes (Signed)
[facility] Emergency Department Provider Note   ____________________________________________   I have reviewed the triage vital signs and the nursing notes.   HISTORY  Chief Complaint Cough and Back Pain   History limited by: Not Limited   HPI Sandy Eaton is a 77 y.o. female who presents to the emergency department today because of concerns for back pain, cough and headache.  The patient states that the symptoms have been present for roughly 2 weeks.  The pain is located in her right upper back.  She has had a cough productive of clear phlegm.  She denies any bloody cough.  She does have some shortness of breath has a history of COPD.  She has been using her inhalers without any significant relief.  She also is complaining of a headache.  Feels like for the past few days she has had fevers.  She states she has had similar symptoms in the past when she has been diagnosed with pneumonia.   Per medical record review patient has a history of hyperlipidemia, COPD  Past Medical History:  Diagnosis Date  . Asthma   . COPD (chronic obstructive pulmonary disease) (HCC)    Suspected  . Environmental allergies   . GERD (gastroesophageal reflux disease)   . Hyperlipidemia   . Pneumonia    3 times    Patient Active Problem List   Diagnosis Date Noted  . Pneumonia 05/05/2015  . Hospital acquired PNA 11/26/2014  . SOB (shortness of breath) 02/24/2014  . DOE (dyspnea on exertion) 02/24/2014  . Cough 02/24/2014  . 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 APPENDECTOMY    . FEMORAL HERNIA REPAIR Right 01/15/2017   Procedure: HERNIA REPAIR FEMORAL;  Surgeon: Nadeen LandauSmith, Jarvis Wilton, MD;  Location: [facility] ORS;  Service: General;  Laterality: Right;  . ROTATOR CUFF REPAIR Right   . TONSILLECTOMY    . VAGINAL HYSTERECTOMY     1960's    Prior to Admission medications   Medication Sig Start Date End Date Taking?  Authorizing Provider  acetaminophen (TYLENOL) 500 MG tablet Take 1,000 mg by mouth 2 (two) times daily as needed for mild pain or headache.    [provider]  albuterol (PROVENTIL) (2.5 MG/3ML) 0.083% nebulizer solution Take 2.5 mg by nebulization every 6 (six) hours as needed for wheezing or shortness of breath.    [provider]  Albuterol Sulfate (PROAIR RESPICLICK) 108 (90 Base) MCG/ACT AEPB Inhale 2 puffs into the lungs every 4 (four) hours as needed. 12/24/17   Merwyn KatosSimonds, David B, MD  aspirin EC 81 MG tablet Take 81 mg by mouth daily.    [provider]  atorvastatin (LIPITOR) 40 MG tablet Take 40 mg by mouth at bedtime.     [provider]  benzonatate (TESSALON) 100 MG capsule Take 1 capsule (100 mg total) by mouth 3 (three) times daily as needed for cough. 07/09/17   Merwyn KatosSimonds, David B, MD  budesonide-formoterol (SYMBICORT) 80-4.5 MCG/ACT inhaler Inhale 2 puffs into the lungs 2 (two) times daily. 05/23/16   Merwyn KatosSimonds, David B, MD  cetirizine (ZYRTEC) 10 MG tablet Take 10 mg by mouth daily.     [provider]  diphenhydrAMINE (BENADRYL) 25 MG tablet Take 25 mg by mouth at bedtime as needed.    [provider]  fluticasone (FLONASE) 50 MCG/ACT nasal spray Place 1 spray into both nostrils daily as needed for rhinitis.     [provider]  HYDROcodone-acetaminophen (NORCO) 5-325 MG tablet Take 1-2 tablets by mouth every 4 (four) hours as needed for moderate pain. 01/15/17   Nadeen LandauSmith, Jarvis Wilton, MD  omeprazole (PRILOSEC) 40 MG capsule Take 40 mg by mouth daily.     [provider]  Respiratory Therapy Supplies (FLUTTER) DEVI Use 10-15 times daily 12/24/17   Merwyn KatosSimonds, David B, MD  Respiratory Therapy Supplies (FLUTTER) DEVI 1 each by Does not apply route 2 (two) times daily. 05/28/18   Merwyn KatosSimonds, David B, MD  tetrahydrozoline-zinc (VISINE-AC) 0.05-0.25 % ophthalmic solution Place 1 drop into both eyes 3 (three) times daily as needed  (irritated eyes).    [provider]    Allergies Augmentin [amoxicillin-pot clavulanate] and Valium [diazepam]  Family History  Problem Relation Age of Onset  . COPD Father   . Asthma Daughter   . Asthma Grandchild   . Cancer Daughter        breast  . Breast cancer Daughter     Social History Social History   Tobacco Use  . Smoking status: Former Smoker    Packs/day: 1.00    Years: 30.00    Pack years: 30.00    Types: Cigarettes    Last attempt to quit: 02/24/2009    Years since quitting: 9.2  . Smokeless tobacco: Never Used  Substance Use Topics  . Alcohol use: No  . Drug use: No    Review of Systems Constitutional: Positive for fevers Eyes: No visual changes. ENT: Positive for sore throat Cardiovascular: Positive for chest pain Respiratory: Positive for shortness of breath Gastrointestinal: No abdominal pain.  No nausea, no vomiting.  No diarrhea.   Genitourinary: Negative for dysuria. Musculoskeletal: Positive for back pain Skin: Negative for rash. Neurological: Positive for headache  ____________________________________________   PHYSICAL EXAM:  VITAL SIGNS: ED Triage Vitals  Enc Vitals Group     BP 06/09/18 1905 111/61     Pulse Rate 06/09/18 1905 (!) 106     Resp 06/09/18 1905 20     Temp 06/09/18 1905 98.5 F (36.9 C)     Temp Source 06/09/18 1905 Oral     SpO2 06/09/18 1905 91 %     Weight 06/09/18 1906 144 lb 3.2 oz (65.4 kg)     Height 06/09/18 1906 5\' 3"  (1.6 m)     Head Circumference --      Peak Flow --      Pain Score 06/09/18 1906 4   Constitutional: Alert and oriented.  Eyes: Conjunctivae are normal.  ENT      Head: Normocephalic and atraumatic.      Nose: No congestion/rhinnorhea.      Mouth/Throat: Mucous membranes are moist.      Neck: No stridor. Hematological/Lymphatic/Immunilogical: No cervical lymphadenopathy. Cardiovascular: Normal rate, regular rhythm.  No murmurs, rubs, or gallops.  Respiratory: Slightly  increased respiratory effort.  Diffuse expiratory wheezing. Gastrointestinal: Soft and non tender. No rebound. No guarding.  Genitourinary: Deferred Musculoskeletal: Normal range of motion in all extremities. No lower extremity edema. Neurologic:  Normal speech and language. No gross focal neurologic deficits are appreciated.  Skin:  Skin is warm, dry and intact. No rash noted. Psychiatric: Mood and affect are normal. Speech and behavior are normal. Patient exhibits appropriate insight and judgment.  ____________________________________________    LABS (pertinent positives/negatives)  CBC wbc 4.0, hgb 13.8, plt 167 D-dimer 588.63 Trop <0.03 BMP na 137, k 3.4, glu 150, cr 0.49 ___________________________________________   EKG  I, Phineas SemenGraydon Ariell Gunnels,  attending physician, personally viewed and interpreted this EKG  EKG Time: 1914 Rate: 107 Rhythm: sinus tachycardia Axis: normal Intervals: qtc 453 QRS: narrow ST changes: no st elevation Impression: abnormal ekg   ____________________________________________    RADIOLOGY  CxR No acute disease  ____________________________________________   PROCEDURES  Procedures  ____________________________________________   INITIAL IMPRESSION / ASSESSMENT AND PLAN / ED COURSE  Pertinent labs & imaging results that were available during my care of the patient were reviewed by me and considered in my medical decision making (see chart for details).   Patient presented because of concern for right upper back pain, headache, cough. Concern for pneumonia, copd, PE, acs amongst other etiologies. Patient did have a slightly elevated d-dimer. Cta pe without pulmonary embolism. At this point I think copd exacerbation likely. Given hypoxia on RA will plan on admission. Also discussed finding of thyroid abnormality with patient and family and that they will need follow up with pcp.  ____________________________________________   FINAL  CLINICAL IMPRESSION(S) / ED DIAGNOSES  Final diagnoses:  Cough  COPD exacerbation (HCC)     Note: This dictation was prepared with Dragon dictation. Any transcriptional errors that result from this process are unintentional     Phineas Semen, MD 06/10/18 1546

## 2018-06-09 NOTE — ED Triage Notes (Signed)
Pt to ED reporting cough, and back pain since last week. Pt has a hx of recurrent pneumonia and states the back and new onset of jaw pain feels similar to the body aches she experienced last time she had pneumonia. Pt also reporting temperatures at home of 99.8. Home medications have not been helping.   Pts oxygen saturation is 90 on RA in triage.

## 2018-06-09 NOTE — ED Notes (Signed)
Transferred patient to restroom.

## 2018-06-09 NOTE — ED Notes (Signed)
Patient states taking baby aspirin and Lipitor.

## 2018-06-09 NOTE — ED Notes (Signed)
Patient states having pain on back and really bad headache. Patient states her whole head hurts. Having cough slight fever and some nasal drainage. Patient states having pneumonia shot in past. Patient states having COPD, bronchitis. Patient states done neb treatment today and yesterday, on inhalers scheduled BID and emergency inhaler. Has seen primary and pulmonary specialist in the past two weeks.

## 2018-06-09 NOTE — ED Notes (Signed)
EDP did trail run with patient off O2. Patient O2 dropped into upper 80's. O2 replaced at 4 liters.

## 2018-06-10 ENCOUNTER — Inpatient Hospital Stay: Payer: BLUE CROSS/BLUE SHIELD

## 2018-06-10 ENCOUNTER — Other Ambulatory Visit: Payer: Self-pay

## 2018-06-10 LAB — CBC
HCT: 40.6 % (ref 36.0–46.0)
Hemoglobin: 13.4 g/dL (ref 12.0–15.0)
MCH: 31.6 pg (ref 26.0–34.0)
MCHC: 33 g/dL (ref 30.0–36.0)
MCV: 95.8 fL (ref 80.0–100.0)
PLATELETS: 156 10*3/uL (ref 150–400)
RBC: 4.24 MIL/uL (ref 3.87–5.11)
RDW: 12 % (ref 11.5–15.5)
WBC: 4.6 10*3/uL (ref 4.0–10.5)
nRBC: 0 % (ref 0.0–0.2)

## 2018-06-10 LAB — LIPID PANEL
Cholesterol: 151 mg/dL (ref 0–200)
HDL: 49 mg/dL (ref >40)
LDL Cholesterol: 88 mg/dL (ref 0–99)
Total CHOL/HDL Ratio: 3.1 RATIO
Triglycerides: 69 mg/dL (ref <150)
VLDL: 14 mg/dL (ref 0–40)

## 2018-06-10 LAB — BASIC METABOLIC PANEL
Anion gap: 12 (ref 5–15)
BUN: 13 mg/dL (ref 8–23)
CO2: 21 mmol/L — ABNORMAL LOW (ref 22–32)
CREATININE: 0.56 mg/dL (ref 0.44–1.00)
Calcium: 8.5 mg/dL — ABNORMAL LOW (ref 8.9–10.3)
Chloride: 104 mmol/L (ref 98–111)
GFR calc Af Amer: >60 mL/min (ref >60)
GFR calc non Af Amer: >60 mL/min (ref >60)
Glucose, Bld: 202 mg/dL — ABNORMAL HIGH (ref 70–99)
Potassium: 3.8 mmol/L (ref 3.5–5.1)
Sodium: 137 mmol/L (ref 135–145)

## 2018-06-10 LAB — MAGNESIUM: Magnesium: 2.1 mg/dL (ref 1.7–2.4)

## 2018-06-10 MED ORDER — MOMETASONE FURO-FORMOTEROL FUM 100-5 MCG/ACT IN AERO
2.0000 | INHALATION_SPRAY | Freq: Two times a day (BID) | RESPIRATORY_TRACT | Status: DC
  Administered 2018-06-10 – 2018-06-11 (×3): 2 via RESPIRATORY_TRACT
  Filled 2018-06-10: qty 8.8

## 2018-06-10 MED ORDER — HYDROCODONE-ACETAMINOPHEN 5-325 MG PO TABS: 1.0000 | ORAL_TABLET | ORAL | Status: DC | PRN

## 2018-06-10 MED ORDER — ACETAMINOPHEN 500 MG PO TABS
1000.0000 mg | ORAL_TABLET | Freq: Two times a day (BID) | ORAL | Status: DC | PRN
  Administered 2018-06-10 (×3): 1000 mg via ORAL
  Filled 2018-06-10 (×3): qty 2

## 2018-06-10 MED ORDER — PANTOPRAZOLE SODIUM 40 MG PO TBEC
40.0000 mg | DELAYED_RELEASE_TABLET | Freq: Every day | ORAL | Status: DC
  Administered 2018-06-10 – 2018-06-11 (×2): 40 mg via ORAL
  Filled 2018-06-10 (×2): qty 1

## 2018-06-10 MED ORDER — NAPHAZOLINE-GLYCERIN 0.012-0.2 % OP SOLN
2.0000 [drp] | Freq: Three times a day (TID) | OPHTHALMIC | Status: DC
  Administered 2018-06-10 – 2018-06-11 (×2): 2 [drp] via OPHTHALMIC
  Filled 2018-06-10: qty 15

## 2018-06-10 MED ORDER — POTASSIUM CHLORIDE 20 MEQ PO PACK
40.0000 meq | PACK | Freq: Once | ORAL | Status: AC
  Administered 2018-06-10: 40 meq via ORAL
  Filled 2018-06-10: qty 2

## 2018-06-10 MED ORDER — GUAIFENESIN ER 600 MG PO TB12
600.0000 mg | ORAL_TABLET | Freq: Two times a day (BID) | ORAL | Status: DC
  Administered 2018-06-10 – 2018-06-11 (×3): 600 mg via ORAL
  Filled 2018-06-10 (×3): qty 1

## 2018-06-10 MED ORDER — DIPHENHYDRAMINE HCL 25 MG PO CAPS: 25.0000 mg | ORAL_CAPSULE | Freq: Every evening | ORAL | Status: DC | PRN

## 2018-06-10 MED ORDER — ONDANSETRON HCL 4 MG PO TABS: 4.0000 mg | ORAL_TABLET | Freq: Four times a day (QID) | ORAL | Status: DC | PRN

## 2018-06-10 MED ORDER — POLYETHYLENE GLYCOL 3350 17 G PO PACK: 17.0000 g | PACK | Freq: Every day | ORAL | Status: DC | PRN

## 2018-06-10 MED ORDER — PANTOPRAZOLE SODIUM 40 MG PO TBEC: 40.0000 mg | DELAYED_RELEASE_TABLET | Freq: Every day | ORAL | Status: DC

## 2018-06-10 MED ORDER — ACETAMINOPHEN 325 MG PO TABS: 650.0000 mg | ORAL_TABLET | Freq: Four times a day (QID) | ORAL | Status: DC | PRN

## 2018-06-10 MED ORDER — ATORVASTATIN CALCIUM 20 MG PO TABS
40.0000 mg | ORAL_TABLET | Freq: Every day | ORAL | Status: DC
  Administered 2018-06-10: 40 mg via ORAL
  Filled 2018-06-10: qty 2

## 2018-06-10 MED ORDER — TOPIRAMATE 25 MG PO TABS
50.0000 mg | ORAL_TABLET | Freq: Two times a day (BID) | ORAL | Status: DC
  Administered 2018-06-10 – 2018-06-11 (×3): 50 mg via ORAL
  Filled 2018-06-10 (×4): qty 2

## 2018-06-10 MED ORDER — METHYLPREDNISOLONE SODIUM SUCC 125 MG IJ SOLR
60.0000 mg | Freq: Four times a day (QID) | INTRAMUSCULAR | Status: DC
  Administered 2018-06-10 – 2018-06-11 (×6): 60 mg via INTRAVENOUS
  Filled 2018-06-10 (×6): qty 2

## 2018-06-10 MED ORDER — ACETAMINOPHEN 650 MG RE SUPP: 650.0000 mg | Freq: Four times a day (QID) | RECTAL | Status: DC | PRN

## 2018-06-10 MED ORDER — LORATADINE 10 MG PO TABS
10.0000 mg | ORAL_TABLET | Freq: Every day | ORAL | Status: DC
  Administered 2018-06-10 – 2018-06-11 (×2): 10 mg via ORAL
  Filled 2018-06-10 (×2): qty 1

## 2018-06-10 MED ORDER — ENOXAPARIN SODIUM 40 MG/0.4ML ~~LOC~~ SOLN
40.0000 mg | SUBCUTANEOUS | Status: DC
  Filled 2018-06-10: qty 0.4

## 2018-06-10 MED ORDER — ADULT MULTIVITAMIN W/MINERALS CH
1.0000 | ORAL_TABLET | Freq: Every day | ORAL | Status: DC
  Administered 2018-06-10 – 2018-06-11 (×2): 1 via ORAL
  Filled 2018-06-10 (×2): qty 1

## 2018-06-10 MED ORDER — POTASSIUM CHLORIDE IN NACL 20-0.45 MEQ/L-% IV SOLN
INTRAVENOUS | Status: DC
  Administered 2018-06-10: 03:00:00 via INTRAVENOUS
  Filled 2018-06-10 (×3): qty 1000

## 2018-06-10 MED ORDER — FLUTICASONE PROPIONATE 50 MCG/ACT NA SUSP
1.0000 | Freq: Every day | NASAL | Status: DC | PRN
  Filled 2018-06-10: qty 16

## 2018-06-10 MED ORDER — FLUTTER DEVI
1.0000 | Freq: Two times a day (BID) | Status: DC
  Administered 2018-06-10 (×2): 1
  Filled 2018-06-10: qty 1

## 2018-06-10 MED ORDER — BENZONATATE 100 MG PO CAPS: 100.0000 mg | ORAL_CAPSULE | Freq: Three times a day (TID) | ORAL | Status: DC | PRN

## 2018-06-10 MED ORDER — SODIUM BICARBONATE 650 MG PO TABS
650.0000 mg | ORAL_TABLET | Freq: Four times a day (QID) | ORAL | Status: AC
  Administered 2018-06-10 – 2018-06-11 (×4): 650 mg via ORAL
  Filled 2018-06-10 (×4): qty 1

## 2018-06-10 MED ORDER — IPRATROPIUM-ALBUTEROL 0.5-2.5 (3) MG/3ML IN SOLN
3.0000 mL | Freq: Four times a day (QID) | RESPIRATORY_TRACT | Status: DC
  Administered 2018-06-10 – 2018-06-11 (×5): 3 mL via RESPIRATORY_TRACT
  Filled 2018-06-10 (×6): qty 3

## 2018-06-10 MED ORDER — ASPIRIN EC 81 MG PO TBEC
81.0000 mg | DELAYED_RELEASE_TABLET | Freq: Every day | ORAL | Status: DC
Start: 2018-06-10 — End: 2018-06-11
  Administered 2018-06-10 – 2018-06-11 (×2): 81 mg via ORAL
  Filled 2018-06-10 (×2): qty 1

## 2018-06-10 MED ORDER — BENZONATATE 100 MG PO CAPS
200.0000 mg | ORAL_CAPSULE | Freq: Three times a day (TID) | ORAL | Status: DC | PRN
  Administered 2018-06-10 (×2): 200 mg via ORAL
  Filled 2018-06-10 (×2): qty 2

## 2018-06-10 MED ORDER — ONDANSETRON HCL 4 MG/2ML IJ SOLN: 4.0000 mg | Freq: Four times a day (QID) | INTRAMUSCULAR | Status: DC | PRN

## 2018-06-10 NOTE — Progress Notes (Signed)
[facility] [place] at [facility]   PATIENT NAME: Sandy Eaton    MR#:  [id number]  DATE OF BIRTH:  [birth year]  SUBJECTIVE:  Patient feeling better, continued episodes of hypoxia per nursing staff, thyroid ultrasound noted  REVIEW OF SYSTEMS:  CONSTITUTIONAL: No fever, fatigue or weakness.  EYES: No blurred or double vision.  EARS, NOSE, AND THROAT: No tinnitus or ear pain.  RESPIRATORY: No cough, shortness of breath, wheezing or hemoptysis.  CARDIOVASCULAR: No chest pain, orthopnea, edema.  GASTROINTESTINAL: No nausea, vomiting, diarrhea or abdominal pain.  GENITOURINARY: No dysuria, hematuria.  ENDOCRINE: No polyuria, nocturia,  HEMATOLOGY: No anemia, easy bruising or bleeding SKIN: No rash or lesion. MUSCULOSKELETAL: No joint pain or arthritis.   NEUROLOGIC: No tingling, numbness, weakness.  PSYCHIATRY: No anxiety or depression.   ROS  DRUG ALLERGIES:   Allergies  Allergen Reactions  . Augmentin [Amoxicillin-Pot Clavulanate] Nausea And Vomiting    Has patient had a PCN reaction causing immediate rash, facial/tongue/throat swelling, SOB or lightheadedness with hypotension: No Has patient had a PCN reaction causing severe rash involving mucus membranes or skin necrosis: No Has patient had a PCN reaction that required hospitalization: No Has patient had a PCN reaction occurring within the last 10 years: No If all of the above answers are "NO", then may proceed with Cephalosporin use.   . Valium [Diazepam] Other (See Comments)    Reaction:  Hallucinations     VITALS:  Blood pressure 112/73, pulse 96, temperature 97.7 F (36.5 C), temperature source Oral, resp. rate 18, height 5\' 3"  (1.6 m), weight 65.4 kg, SpO2 93 %.  PHYSICAL EXAMINATION:  GENERAL:  77 y.o.-year-old patient lying in the bed with no acute distress.  EYES: Pupils equal, round, reactive to light and accommodation. No scleral icterus. Extraocular muscles intact.  HEENT: Head atraumatic,  normocephalic. Oropharynx and nasopharynx clear.  NECK:  Supple, no jugular venous distention. No thyroid enlargement, no tenderness.  LUNGS: Normal breath sounds bilaterally, no wheezing, rales,rhonchi or crepitation. No use of accessory muscles of respiration.  CARDIOVASCULAR: S1, S2 normal. No murmurs, rubs, or gallops.  ABDOMEN: Soft, nontender, nondistended. Bowel sounds present. No organomegaly or mass.  EXTREMITIES: No pedal edema, cyanosis, or clubbing.  NEUROLOGIC: Cranial nerves II through XII are intact. Muscle strength 5/5 in all extremities. Sensation intact. Gait not checked.  PSYCHIATRIC: The patient is alert and oriented x 3.  SKIN: No obvious rash, lesion, or ulcer.   Physical Exam LABORATORY PANEL:   CBC Recent Labs  Lab 06/10/18 0104  WBC 4.6  HGB 13.4  HCT 40.6  PLT 156   ------------------------------------------------------------------------------------------------------------------  Chemistries  Recent Labs  Lab 06/10/18 0104  NA 137  K 3.8  CL 104  CO2 21*  GLUCOSE 202*  BUN 13  CREATININE 0.56  CALCIUM 8.5*  MG 2.1   ------------------------------------------------------------------------------------------------------------------  Cardiac Enzymes Recent Labs  Lab 06/09/18 1916  TROPONINI <0.03   ------------------------------------------------------------------------------------------------------------------  RADIOLOGY:  Dg Chest 2 View  Result Date: 06/09/2018 CLINICAL DATA:  77 year old female with cough and back pain since last week. EXAM: CHEST - 2 VIEW COMPARISON:  Chest radiographs 04/28/2018 and earlier. FINDINGS: No cardiomegaly. Mediastinal contours are stable and within normal limits; paraspinal fat seen by CT in 2016 seems to simulate the presence of a small chronic hiatal hernia. Visualized tracheal air column is within normal limits. No pneumothorax, pulmonary edema, pleural effusion or confluent pulmonary opacity. Stable mild  increased pulmonary interstitial markings. Stable visualized osseous structures. Negative visible  bowel gas pattern. IMPRESSION: No acute cardiopulmonary abnormality. Electronically Signed   By: Odessa Fleming M.D.   On: 06/09/2018 19:42   Ct Angio Chest Pe W And/or Wo Contrast  Result Date: 06/09/2018 CLINICAL DATA:  Acute onset of cough and back pain. Elevated D-dimer. Shortness of breath. EXAM: CT ANGIOGRAPHY CHEST WITH CONTRAST TECHNIQUE: Multidetector CT imaging of the chest was performed using the standard protocol during bolus administration of intravenous contrast. Multiplanar CT image reconstructions and MIPs were obtained to evaluate the vascular anatomy. CONTRAST:  ISOVUE-370 IOPAMIDOL (ISOVUE-370) INJECTION 76% COMPARISON:  Chest radiograph performed earlier today at 7:16 p.m., and CT of the chest performed 04/06/2015 FINDINGS: Cardiovascular:  There is no evidence of pulmonary embolus. The heart is normal in size. The thoracic aorta is grossly unremarkable, aside from mild calcification at the aortic arch. The great vessels are within normal limits. Mediastinum/Nodes: The mediastinum is unremarkable in appearance. No mediastinal lymphadenopathy is seen. No pericardial effusion is identified. A 1.7 cm hypodensity is noted at the left thyroid lobe. No axillary lymphadenopathy is seen. Lungs/Pleura: Mild bibasilar atelectasis is noted. No pleural effusion or pneumothorax is seen. No masses are identified. Upper Abdomen: A 3.2 cm cystic focus within the liver and increased gradually in size from 2016, and may reflect a simple cyst. The visualized portions of the liver and spleen are otherwise unremarkable. The visualized portions of the gallbladder, pancreas, adrenal glands and kidneys are within normal limits. Musculoskeletal: No acute osseous abnormalities are identified. The visualized musculature is unremarkable in appearance. Review of the MIP images confirms the above findings. IMPRESSION: 1. No  evidence of pulmonary embolus. 2. Mild bibasilar atelectasis noted. 3. 1.7 cm hypodensity at the left thyroid lobe. Consider further evaluation with thyroid ultrasound. If patient is clinically hyperthyroid, consider nuclear medicine thyroid uptake and scan. 4. 3.2 cm cystic focus within the liver has increased only gradually in size from 2016, and may reflect a simple cyst. Electronically Signed   By: Roanna Raider M.D.   On: 06/09/2018 23:10   US Thyroid  Result Date: 06/10/2018 CLINICAL DATA:  Incidental on CT.  Left thyroid nodule noted on CT. EXAM: THYROID ULTRASOUND TECHNIQUE: Ultrasound examination of the thyroid gland and adjacent soft tissues was performed. COMPARISON:  None. FINDINGS: Parenchymal Echotexture: Normal Isthmus: 0.3 cm Right lobe: 3.7 x 1.6 x 1.6 cm Left lobe: 4.2 x 1.7 x 1.6 cm _________________________________________________________ Estimated total number of nodules >/= 1 cm: 1 Number of spongiform nodules >/=  2 cm not described below (TR1): 0 Number of mixed cystic and solid nodules >/= 1.5 cm not described below (TR2): 0 _________________________________________________________ Nodule # 1: Location: Left; Inferior Maximum size: 1.4 cm; Other 2 dimensions: 1.2 x 1.1 cm Composition: solid/almost completely solid (2) Echogenicity: hypoechoic (2) Shape: not taller-than-wide (0) Margins: smooth (0) Echogenic foci: none (0) ACR TI-RADS total points: 4. ACR TI-RADS risk category: TR4 (4-6 points). ACR TI-RADS recommendations: *Given size (>/= 1 - 1.4 cm) and appearance, a follow-up ultrasound in 1 year should be considered based on TI-RADS criteria. _________________________________________________________ IMPRESSION: Left lower pole nodule 1 meets criteria for annual follow-up and corresponds to the abnormality on CT. The above is in keeping with the ACR TI-RADS recommendations - J Am Coll Radiol 2017;14:587-595. Electronically Signed   By: Jolaine Click M.D.   On: 06/10/2018 09:50     ASSESSMENT AND PLAN:  *Acute on COPD/asthmatic exacerbation Resolving Continue Solu-Medrol with tapering as tolerated, aggressive pulmonary toilet bronchodilator therapy, empiric doxycycline  *  Chronic GERD without esophagitis Stable on current regiment  *Acute hypokalemia Repleted   *Acute thyroid lesion Thyroid ultrasound noted for left thyroid lobe nodule-recommended follow-up in 1 year for reevaluation, follow-up on thyroid panel, will need follow-up with primary care provider status post discharge for continued medical management  *Chronic hyperlipidemia Stable   All the records are reviewed and case discussed with Care Management/Social Workerr. Management plans discussed with the patient, family and they are in agreement.  CODE STATUS: full  TOTAL TIME TAKING CARE OF THIS PATIENT: 35 minutes.     POSSIBLE D/C IN 1-2 DAYS, DEPENDING ON CLINICAL CONDITION.   Evelena AsaMontell D Reni Hausner M.D on 06/10/2018   Between 7am to 6pm - Pager - [phone]  After 6pm go to [email or web address] - Social research officer, governmentpassword EPAS [email or web address]  [facility] [place] [facility]  Office  [phone]  CC: Primary care physician; White, Arlyss RepressElizabeth Burney, NP  Note: This dictation was prepared with Dragon dictation along with smaller phrase technology. Any transcriptional errors that result from this process are unintentional.

## 2018-06-10 NOTE — Progress Notes (Signed)
Patient continues to complain of her headache, cough, and sneezing;.  Tessalon increased and topamax ordered

## 2018-06-11 LAB — TSH: TSH: 0.46 u[IU]/mL (ref 0.350–4.500)

## 2018-06-11 LAB — THYROID PANEL
Free Thyroxine Index: 2 (ref 1.2–4.9)
T3 Uptake Ratio: 26 % (ref 24–39)
T4, Total: 7.8 ug/dL (ref 4.5–12.0)

## 2018-06-11 MED ORDER — BUDESONIDE-FORMOTEROL FUMARATE 80-4.5 MCG/ACT IN AERO: 2.0000 | INHALATION_SPRAY | Freq: Two times a day (BID) | RESPIRATORY_TRACT | 2 refills | Status: DC

## 2018-06-11 MED ORDER — ALBUTEROL SULFATE 108 (90 BASE) MCG/ACT IN AEPB: 2.0000 | INHALATION_SPRAY | RESPIRATORY_TRACT | 3 refills | Status: DC | PRN

## 2018-06-11 MED ORDER — DOXYCYCLINE HYCLATE 100 MG PO TABS: 100.0000 mg | ORAL_TABLET | Freq: Two times a day (BID) | ORAL | 0 refills | Status: DC

## 2018-06-11 MED ORDER — PREDNISONE 50 MG PO TABS: ORAL_TABLET | ORAL | 0 refills | Status: DC

## 2018-06-11 MED ORDER — PANTOPRAZOLE SODIUM 40 MG PO TBEC: 40.0000 mg | DELAYED_RELEASE_TABLET | Freq: Every day | ORAL | 0 refills | Status: DC

## 2018-06-11 MED ORDER — ALBUTEROL SULFATE (2.5 MG/3ML) 0.083% IN NEBU: 2.5000 mg | INHALATION_SOLUTION | Freq: Four times a day (QID) | RESPIRATORY_TRACT | 12 refills | Status: DC | PRN

## 2018-06-11 NOTE — Discharge Summary (Signed)
[facility] [place] [facility]   PATIENT NAME: Sandy Eaton    MR#:  [id number]  DATE OF BIRTH:  January 30, 1941  DATE OF ADMISSION:  06/09/2018 ADMITTING PHYSICIAN: Evelena AsaMontell D Ladarien Beeks, MD  DATE OF DISCHARGE: No discharge date for patient encounter.  PRIMARY CARE PHYSICIAN: Titus MouldWhite, Elizabeth Burney, NP    ADMISSION DIAGNOSIS:  Cough [R05] COPD exacerbation (HCC) [J44.1]  DISCHARGE DIAGNOSIS:  Active Problems:   COPD (chronic obstructive pulmonary disease) (HCC)   SECONDARY DIAGNOSIS:   Past Medical History:  Diagnosis Date  . Asthma   . COPD (chronic obstructive pulmonary disease) (HCC)    Suspected  . Environmental allergies   . GERD (gastroesophageal reflux disease)   . Hyperlipidemia   . Pneumonia    3 times    HOSPITAL COURSE:  *Acute on COPD/asthmatic exacerbation Resolving Treated with IV Solu-Medrol while in house, aggressive pulmonary toilet bronchodilator therapy, empiric doxycycline, successfully weaned off oxygen, ambulated in the hallways greater than 300 feet-satting 92% on room air on day of discharge  *Chronic GERD without esophagitis Stable on current regiment  *Acute hypokalemia Repleted   *Acute thyroid lesion Thyroid ultrasound noted for left thyroid lobe nodule-recommended follow-up in 1 year for reevaluation, patient to follow-up with primary care provider status post discharge for continued care/management, thyroid panel was normal   *Chronic hyperlipidemia Stable    DISCHARGE CONDITIONS:   stable  CONSULTS OBTAINED:    DRUG ALLERGIES:   Allergies  Allergen Reactions  . Augmentin [Amoxicillin-Pot Clavulanate] Nausea And Vomiting    Has patient had a PCN reaction causing immediate rash, facial/tongue/throat swelling, SOB or lightheadedness with hypotension: No Has patient had a PCN reaction causing severe rash involving mucus membranes or skin necrosis: No Has patient had a PCN reaction that required  hospitalization: No Has patient had a PCN reaction occurring within the last 10 years: No If all of the above answers are "NO", then may proceed with Cephalosporin use.   . Valium [Diazepam] Other (See Comments)    Reaction:  Hallucinations     DISCHARGE MEDICATIONS:   Allergies as of 06/11/2018      Reactions   Augmentin [amoxicillin-pot Clavulanate] Nausea And Vomiting   Has patient had a PCN reaction causing immediate rash, facial/tongue/throat swelling, SOB or lightheadedness with hypotension: No Has patient had a PCN reaction causing severe rash involving mucus membranes or skin necrosis: No Has patient had a PCN reaction that required hospitalization: No Has patient had a PCN reaction occurring within the last 10 years: No If all of the above answers are "NO", then may proceed with Cephalosporin use.   Valium [diazepam] Other (See Comments)   Reaction:  Hallucinations       Medication List    TAKE these medications   acetaminophen 500 MG tablet Commonly known as:  TYLENOL Take 1,000 mg by mouth 2 (two) times daily as needed for mild pain or headache.   albuterol (2.5 MG/3ML) 0.083% nebulizer solution Commonly known as:  PROVENTIL Take 2.5 mg by nebulization every 6 (six) hours as needed for wheezing or shortness of breath.   Albuterol Sulfate 108 (90 Base) MCG/ACT Aepb Commonly known as:  PROAIR RESPICLICK Inhale 2 puffs into the lungs every 4 (four) hours as needed.   aspirin EC 81 MG tablet Take 81 mg by mouth daily.   atorvastatin 40 MG tablet Commonly known as:  LIPITOR Take 40 mg by mouth at bedtime.   benzonatate 100 MG capsule Commonly known as:  TESSALON Take 1 capsule (100 mg total) by mouth 3 (three) times daily as needed for cough.   budesonide-formoterol 80-4.5 MCG/ACT inhaler Commonly known as:  SYMBICORT Inhale 2 puffs into the lungs 2 (two) times daily.   cetirizine 10 MG tablet Commonly known as:  ZYRTEC Take 10 mg by mouth daily.    diphenhydrAMINE 25 MG tablet Commonly known as:  BENADRYL Take 25 mg by mouth at bedtime as needed.   doxycycline 100 MG tablet Commonly known as:  VIBRA-TABS Take 1 tablet (100 mg total) by mouth every 12 (twelve) hours.   fluticasone 50 MCG/ACT nasal spray Commonly known as:  FLONASE Place 1 spray into both nostrils daily as needed for rhinitis.   FLUTTER Devi Use 10-15 times daily   FLUTTER Devi 1 each by Does not apply route 2 (two) times daily.   HYDROcodone-acetaminophen 5-325 MG tablet Commonly known as:  NORCO Take 1-2 tablets by mouth every 4 (four) hours as needed for moderate pain.   omeprazole 40 MG capsule Commonly known as:  PRILOSEC Take 40 mg by mouth daily.   pantoprazole 40 MG tablet Commonly known as:  PROTONIX Take 1 tablet (40 mg total) by mouth daily. Start taking on:  June 12, 2018   predniSONE 50 MG tablet Commonly known as:  DELTASONE 1 po daily   tetrahydrozoline-zinc 0.05-0.25 % ophthalmic solution Commonly known as:  VISINE-AC Place 1 drop into both eyes 3 (three) times daily as needed (irritated eyes).        DISCHARGE INSTRUCTIONS:      If you experience worsening of your admission symptoms, develop shortness of breath, life threatening emergency, suicidal or homicidal thoughts you must seek medical attention immediately by calling 911 or calling your MD immediately  if symptoms less severe.  You Must read complete instructions/literature along with all the possible adverse reactions/side effects for all the Medicines you take and that have been prescribed to you. Take any new Medicines after you have completely understood and accept all the possible adverse reactions/side effects.   Please note  You were cared for by a hospitalist during your hospital stay. If you have any questions about your discharge medications or the care you received while you were in the hospital after you are discharged, you can call the unit and asked  to speak with the hospitalist on call if the hospitalist that took care of you is not available. Once you are discharged, your primary care physician will handle any further medical issues. Please note that NO REFILLS for any discharge medications will be authorized once you are discharged, as it is imperative that you return to your primary care physician (or establish a relationship with a primary care physician if you do not have one) for your aftercare needs so that they can reassess your need for medications and monitor your lab values.    Today   CHIEF COMPLAINT:   Chief Complaint  Patient presents with  . Cough  . Back Pain    HISTORY OF PRESENT ILLNESS:  77 y.o. female with a known history per below presents emergency room with several week history of generalized weakness, fatigue, productive cough, worsening shortness of breath, dyspnea on exertion, fevers, chills, night sweats, ill feeling, in the emergency room patient was found to be tachycardic, tachypneic, hypoxic with O2 saturation in the 80s, potassium 3.4, chest x-ray negative for any acute process, CT chest noted for 1.7 cm thyroid lesion/negative for PE, patient evaluated in the emergency room,  family at the bedside, patient is now been admitted for acute on chronic COPD/asthmatic exacerbation.   VITAL SIGNS:  Blood pressure 112/82, pulse 100, temperature 98.4 F (36.9 C), temperature source Oral, resp. rate 20, height 5\' 3"  (1.6 m), weight 65.4 kg, SpO2 94 %.  I/O:    Intake/Output Summary (Last 24 hours) at 06/11/2018 1022 Last data filed at 06/10/2018 1400 Gross per 24 hour  Intake 655.17 ml  Output -  Net 655.17 ml    PHYSICAL EXAMINATION:  GENERAL:  77 y.o.-year-old patient lying in the bed with no acute distress.  EYES: Pupils equal, round, reactive to light and accommodation. No scleral icterus. Extraocular muscles intact.  HEENT: Head atraumatic, normocephalic. Oropharynx and nasopharynx clear.  NECK:   Supple, no jugular venous distention. No thyroid enlargement, no tenderness.  LUNGS: Normal breath sounds bilaterally, no wheezing, rales,rhonchi or crepitation. No use of accessory muscles of respiration.  CARDIOVASCULAR: S1, S2 normal. No murmurs, rubs, or gallops.  ABDOMEN: Soft, non-tender, non-distended. Bowel sounds present. No organomegaly or mass.  EXTREMITIES: No pedal edema, cyanosis, or clubbing.  NEUROLOGIC: Cranial nerves II through XII are intact. Muscle strength 5/5 in all extremities. Sensation intact. Gait not checked.  PSYCHIATRIC: The patient is alert and oriented x 3.  SKIN: No obvious rash, lesion, or ulcer.   DATA REVIEW:   CBC Recent Labs  Lab 06/10/18 0104  WBC 4.6  HGB 13.4  HCT 40.6  PLT 156    Chemistries  Recent Labs  Lab 06/10/18 0104  NA 137  K 3.8  CL 104  CO2 21*  GLUCOSE 202*  BUN 13  CREATININE 0.56  CALCIUM 8.5*  MG 2.1    Cardiac Enzymes Recent Labs  Lab 06/09/18 1916  TROPONINI <0.03    Microbiology Results  Results for orders placed or performed during the hospital encounter of 08/01/15  Rapid Influenza A&B Antigens ([facility] only)     Status: None   Collection Time: 08/01/15 11:04 AM  Result Value Ref Range Status   Influenza A [facility]) NOT DETECTED  Final   Influenza B [facility]) NOT DETECTED  Final    RADIOLOGY:  Dg Chest 2 View  Result Date: 06/09/2018 CLINICAL DATA:  77 year old female with cough and back pain since last week. EXAM: CHEST - 2 VIEW COMPARISON:  Chest radiographs 04/28/2018 and earlier. FINDINGS: No cardiomegaly. Mediastinal contours are stable and within normal limits; paraspinal fat seen by CT in 2016 seems to simulate the presence of a small chronic hiatal hernia. Visualized tracheal air column is within normal limits. No pneumothorax, pulmonary edema, pleural effusion or confluent pulmonary opacity. Stable mild increased pulmonary interstitial markings. Stable visualized osseous structures. Negative visible  bowel gas pattern. IMPRESSION: No acute cardiopulmonary abnormality. Electronically Signed   By: Odessa Fleming M.D.   On: 06/09/2018 19:42   Ct Angio Chest Pe W And/or Wo Contrast  Result Date: 06/09/2018 CLINICAL DATA:  Acute onset of cough and back pain. Elevated D-dimer. Shortness of breath. EXAM: CT ANGIOGRAPHY CHEST WITH CONTRAST TECHNIQUE: Multidetector CT imaging of the chest was performed using the standard protocol during bolus administration of intravenous contrast. Multiplanar CT image reconstructions and MIPs were obtained to evaluate the vascular anatomy. CONTRAST:  ISOVUE-370 IOPAMIDOL (ISOVUE-370) INJECTION 76% COMPARISON:  Chest radiograph performed earlier today at 7:16 p.m., and CT of the chest performed 04/06/2015 FINDINGS: Cardiovascular:  There is no evidence of pulmonary embolus. The heart is normal in size. The thoracic aorta is grossly unremarkable, aside from  mild calcification at the aortic arch. The great vessels are within normal limits. Mediastinum/Nodes: The mediastinum is unremarkable in appearance. No mediastinal lymphadenopathy is seen. No pericardial effusion is identified. A 1.7 cm hypodensity is noted at the left thyroid lobe. No axillary lymphadenopathy is seen. Lungs/Pleura: Mild bibasilar atelectasis is noted. No pleural effusion or pneumothorax is seen. No masses are identified. Upper Abdomen: A 3.2 cm cystic focus within the liver and increased gradually in size from 2016, and may reflect a simple cyst. The visualized portions of the liver and spleen are otherwise unremarkable. The visualized portions of the gallbladder, pancreas, adrenal glands and kidneys are within normal limits. Musculoskeletal: No acute osseous abnormalities are identified. The visualized musculature is unremarkable in appearance. Review of the MIP images confirms the above findings. IMPRESSION: 1. No evidence of pulmonary embolus. 2. Mild bibasilar atelectasis noted. 3. 1.7 cm hypodensity at the  left thyroid lobe. Consider further evaluation with thyroid ultrasound. If patient is clinically hyperthyroid, consider nuclear medicine thyroid uptake and scan. 4. 3.2 cm cystic focus within the liver has increased only gradually in size from 2016, and may reflect a simple cyst. Electronically Signed   By: Roanna Raider M.D.   On: 06/09/2018 23:10   US Thyroid  Result Date: 06/10/2018 CLINICAL DATA:  Incidental on CT.  Left thyroid nodule noted on CT. EXAM: THYROID ULTRASOUND TECHNIQUE: Ultrasound examination of the thyroid gland and adjacent soft tissues was performed. COMPARISON:  None. FINDINGS: Parenchymal Echotexture: Normal Isthmus: 0.3 cm Right lobe: 3.7 x 1.6 x 1.6 cm Left lobe: 4.2 x 1.7 x 1.6 cm _________________________________________________________ Estimated total number of nodules >/= 1 cm: 1 Number of spongiform nodules >/=  2 cm not described below (TR1): 0 Number of mixed cystic and solid nodules >/= 1.5 cm not described below (TR2): 0 _________________________________________________________ Nodule # 1: Location: Left; Inferior Maximum size: 1.4 cm; Other 2 dimensions: 1.2 x 1.1 cm Composition: solid/almost completely solid (2) Echogenicity: hypoechoic (2) Shape: not taller-than-wide (0) Margins: smooth (0) Echogenic foci: none (0) ACR TI-RADS total points: 4. ACR TI-RADS risk category: TR4 (4-6 points). ACR TI-RADS recommendations: *Given size (>/= 1 - 1.4 cm) and appearance, a follow-up ultrasound in 1 year should be considered based on TI-RADS criteria. _________________________________________________________ IMPRESSION: Left lower pole nodule 1 meets criteria for annual follow-up and corresponds to the abnormality on CT. The above is in keeping with the ACR TI-RADS recommendations - J Am Coll Radiol 2017;14:587-595. Electronically Signed   By: Jolaine Click M.D.   On: 06/10/2018 09:50    EKG:   Orders placed or performed during the hospital encounter of 06/09/18  . ED EKG  . ED  EKG  . EKG      Management plans discussed with the patient, family and they are in agreement.  CODE STATUS:     Code Status Orders  (From admission, onward)         Start     Ordered   06/10/18 0056  Full code  Continuous     06/10/18 0055        Code Status History    Date Active Date Inactive Code Status Order ID Comments User Context   05/05/2015 0918 05/06/2015 1913 Full Code [id number]  Katha Hamming, MD ED   11/26/2014 1757 11/29/2014 1732 Full Code [id number]  Ramonita Lab, MD Inpatient      TOTAL TIME TAKING CARE OF THIS PATIENT: 40 minutes.    Evelena Asa Jalonda Antigua M.D on 06/11/2018 at 10:22  AM  Between 7am to 6pm - Pager - [phone]  After 6pm go to [email or web address] - Social research officer, governmentpassword EPAS [facility]  [facility] [place] [facility]  Office  [phone]  CC: Primary care physician; White, Arlyss RepressElizabeth Burney, NP   Note: This dictation was prepared with Dragon dictation along with smaller phrase technology. Any transcriptional errors that result from this process are unintentional.

## 2018-06-11 NOTE — Care Management (Signed)
RNCM spoke with patient. She no longer requires supplemental O2 per patient.  Patient denies CM needs.

## 2018-08-11 ENCOUNTER — Encounter: Payer: Self-pay | Admitting: Intensive Care

## 2018-08-11 ENCOUNTER — Other Ambulatory Visit: Payer: Self-pay

## 2018-08-11 ENCOUNTER — Emergency Department: Payer: BLUE CROSS/BLUE SHIELD

## 2018-08-11 ENCOUNTER — Emergency Department
Admission: EM | Admit: 2018-08-11 | Discharge: 2018-08-11 | Disposition: A | Discharge status: Home / Self Care | Payer: BLUE CROSS/BLUE SHIELD | Attending: Emergency Medicine | Admitting: Emergency Medicine

## 2018-08-11 DIAGNOSIS — Z87891 Personal history of nicotine dependence: Secondary | ICD-10-CM | POA: Diagnosis not present

## 2018-08-11 DIAGNOSIS — Z7982 Long term (current) use of aspirin: Secondary | ICD-10-CM | POA: Diagnosis not present

## 2018-08-11 DIAGNOSIS — J449 Chronic obstructive pulmonary disease, unspecified: Secondary | ICD-10-CM | POA: Insufficient documentation

## 2018-08-11 DIAGNOSIS — Z79899 Other long term (current) drug therapy: Secondary | ICD-10-CM | POA: Insufficient documentation

## 2018-08-11 DIAGNOSIS — R079 Chest pain, unspecified: Secondary | ICD-10-CM | POA: Insufficient documentation

## 2018-08-11 LAB — BASIC METABOLIC PANEL
Anion gap: 7 (ref 5–15)
BUN: 18 mg/dL (ref 8–23)
CALCIUM: 8.9 mg/dL (ref 8.9–10.3)
CO2: 24 mmol/L (ref 22–32)
Chloride: 108 mmol/L (ref 98–111)
Creatinine, Ser: 0.49 mg/dL (ref 0.44–1.00)
GFR calc Af Amer: >60 mL/min (ref >60)
GFR calc non Af Amer: >60 mL/min (ref >60)
Glucose, Bld: 110 mg/dL — ABNORMAL HIGH (ref 70–99)
Potassium: 3.9 mmol/L (ref 3.5–5.1)
Sodium: 139 mmol/L (ref 135–145)

## 2018-08-11 LAB — CBC
HCT: 41.2 % (ref 36.0–46.0)
Hemoglobin: 13.7 g/dL (ref 12.0–15.0)
MCH: 31.5 pg (ref 26.0–34.0)
MCHC: 33.3 g/dL (ref 30.0–36.0)
MCV: 94.7 fL (ref 80.0–100.0)
Platelets: 184 10*3/uL (ref 150–400)
RBC: 4.35 MIL/uL (ref 3.87–5.11)
RDW: 11.8 % (ref 11.5–15.5)
WBC: 6.4 10*3/uL (ref 4.0–10.5)
nRBC: 0 % (ref 0.0–0.2)

## 2018-08-11 LAB — TROPONIN I: Troponin I: <0.03 ng/mL (ref <0.03)

## 2018-08-11 MED ORDER — SODIUM CHLORIDE 0.9% FLUSH
3.0000 mL | Freq: Once | INTRAVENOUS | Status: AC
  Administered 2018-08-11: 3 mL via INTRAVENOUS

## 2018-08-11 NOTE — ED Provider Notes (Signed)
[facility] Emergency Department Provider Note  ____________________________________________   First MD Initiated Contact with Patient 08/11/18 2300     (approximate)  I have reviewed the triage vital signs and the nursing notes.   HISTORY  Chief Complaint Chest Pain    HPI HPI: A 78 year old patient with a history of hypercholesterolemia presents for evaluation of chest pain. Initial onset of pain was more than 6 hours ago. The patient's chest pain is well-localized, is described as heaviness/pressure/tightness and is not worse with exertion. The patient's chest pain is middle- or left-sided, is not sharp and does radiate to the arms/jaw/neck. The patient does not complain of nausea and denies diaphoresis. The patient has a family history of coronary artery disease in a first-degree relative with onset less than age 67. The patient has no history of stroke, has no history of peripheral artery disease, has not smoked in the past 90 days, denies any history of treated diabetes, is not hypertensive and does not have an elevated BMI (>=30).   The patient has no history of blood clots in the legs of the lungs.  She has had no infectious symptoms recently.  She denies fever/chills, shortness of breath, nausea, vomiting, abdominal pain, and dysuria.  Nothing in particular makes her symptoms better or worse and they have been coming and going throughout the day.  Of note, she and her daughter, who is at bedside, notes that she is under a lot of stress due to some other family members and they both wondered if that could have something to do with her discomfort.  In spite of her age, she was at work all day today and she is in good physical shape, gets lots of exercise, and works 5 days a week.  Past Medical History:  Diagnosis Date  . Asthma   . COPD (chronic obstructive pulmonary disease) (HCC)    Suspected  . Environmental allergies   . GERD (gastroesophageal reflux  disease)   . Hyperlipidemia   . Pneumonia    3 times    Patient Active Problem List   Diagnosis Date Noted  . COPD (chronic obstructive pulmonary disease) (HCC) 06/09/2018  . Pneumonia 05/05/2015  . Hospital acquired PNA 11/26/2014  . SOB (shortness of breath) 02/24/2014  . DOE (dyspnea on exertion) 02/24/2014  . Cough 02/24/2014  . 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 APPENDECTOMY    . FEMORAL HERNIA REPAIR Right 01/15/2017   Procedure: HERNIA REPAIR FEMORAL;  Surgeon: Nadeen Landau, MD;  Location: [facility] ORS;  Service: General;  Laterality: Right;  . ROTATOR CUFF REPAIR Right   . TONSILLECTOMY    . VAGINAL HYSTERECTOMY     1960's    Prior to Admission medications   Medication Sig Start Date End Date Taking? Authorizing Provider  acetaminophen (TYLENOL) 500 MG tablet Take 1,000 mg by mouth 2 (two) times daily as needed for mild pain or headache.    [provider]  albuterol (PROVENTIL) (2.5 MG/3ML) 0.083% nebulizer solution Take 3 mLs (2.5 mg total) by nebulization every 6 (six) hours as needed for wheezing or shortness of breath. 06/11/18   Salary, Evelena Asa, MD  Albuterol Sulfate (PROAIR RESPICLICK) 108 (90 Base) MCG/ACT AEPB Inhale 2 puffs into the lungs every 4 (four) hours as needed. 06/11/18   Salary, Jetty Duhamel D, MD  aspirin EC 81 MG tablet Take 81 mg by mouth daily.    [provider]  atorvastatin (LIPITOR) 40 MG tablet Take 40 mg by mouth at bedtime.     [provider]  benzonatate (TESSALON) 100 MG capsule Take 1 capsule (100 mg total) by mouth 3 (three) times daily as needed for cough. 07/09/17   Merwyn Katos, MD  budesonide-formoterol (SYMBICORT) 80-4.5 MCG/ACT inhaler Inhale 2 puffs into the lungs 2 (two) times daily. 06/11/18   Salary, Evelena Asa, MD  cetirizine (ZYRTEC) 10 MG tablet Take 10 mg by mouth daily.     [provider]  diphenhydrAMINE (BENADRYL) 25 MG tablet Take  25 mg by mouth at bedtime as needed.    [provider]  doxycycline (VIBRA-TABS) 100 MG tablet Take 1 tablet (100 mg total) by mouth every 12 (twelve) hours. 06/11/18   Salary, Evelena Asa, MD  fluticasone (FLONASE) 50 MCG/ACT nasal spray Place 1 spray into both nostrils daily as needed for rhinitis.     [provider]  HYDROcodone-acetaminophen (NORCO) 5-325 MG tablet Take 1-2 tablets by mouth every 4 (four) hours as needed for moderate pain. 01/15/17   Nadeen Landau, MD  omeprazole (PRILOSEC) 40 MG capsule Take 40 mg by mouth daily.     [provider]  pantoprazole (PROTONIX) 40 MG tablet Take 1 tablet (40 mg total) by mouth daily. 06/12/18   Salary, Evelena Asa, MD  predniSONE (DELTASONE) 50 MG tablet 1 po daily 06/11/18   Salary, Evelena Asa, MD  Respiratory Therapy Supplies (FLUTTER) DEVI Use 10-15 times daily 12/24/17   Merwyn Katos, MD  Respiratory Therapy Supplies (FLUTTER) DEVI 1 each by Does not apply route 2 (two) times daily. 05/28/18   Merwyn Katos, MD  tetrahydrozoline-zinc (VISINE-AC) 0.05-0.25 % ophthalmic solution Place 1 drop into both eyes 3 (three) times daily as needed (irritated eyes).    [provider]    Allergies Augmentin [amoxicillin-pot clavulanate] and Valium [diazepam]  Family History  Problem Relation Age of Onset  . COPD Father   . Asthma Daughter   . Asthma Grandchild   . Cancer Daughter        breast  . Breast cancer Daughter     Social History Social History   Tobacco Use  . Smoking status: Former Smoker    Packs/day: 1.00    Years: 30.00    Pack years: 30.00    Types: Cigarettes    Last attempt to quit: 02/24/2009    Years since quitting: 9.4  . Smokeless tobacco: Never Used  Substance Use Topics  . Alcohol use: No  . Drug use: No    Review of Systems Constitutional: No fever/chills Eyes: No visual changes. ENT: No sore throat. Cardiovascular: Chest discomfort as described above Respiratory:  Denies shortness of breath. Gastrointestinal: No abdominal pain.  No nausea, no vomiting.  No diarrhea.  No constipation. Genitourinary: Negative for dysuria. Musculoskeletal: Negative for neck pain.  Negative for back pain. Integumentary: Negative for rash. Neurological: Negative for headaches, focal weakness or numbness.   ____________________________________________   PHYSICAL EXAM:  VITAL SIGNS: ED Triage Vitals  Enc Vitals Group     BP 08/11/18 1720 111/77     Pulse Rate 08/11/18 1720 82     Resp --      Temp 08/11/18 1720 98.6 F (37 C)     Temp Source 08/11/18 1720 Oral     SpO2 08/11/18 1720 96 %     Weight 08/11/18 1721 64.9 kg (143 lb)     Height 08/11/18 1721 1.6 m (5'  3")     Head Circumference --      Peak Flow --      Pain Score 08/11/18 1731 5     Pain Loc --      Pain Edu? --      Excl. in GC? --     Constitutional: Alert and oriented. Well appearing and in no acute distress. Eyes: Conjunctivae are normal.  Head: Atraumatic. Nose: No congestion/rhinnorhea. Mouth/Throat: Mucous membranes are moist. Neck: No stridor.  No meningeal signs.   Cardiovascular: Normal rate, regular rhythm. Good peripheral circulation. Grossly normal heart sounds. Respiratory: Normal respiratory effort.  No retractions. Lungs CTAB. Gastrointestinal: Soft and nontender. No distention.  Musculoskeletal: No lower extremity tenderness nor edema. No gross deformities of extremities. Neurologic:  Normal speech and language. No gross focal neurologic deficits are appreciated.  Skin:  Skin is warm, dry and intact. No rash noted. Psychiatric: Mood and affect are normal. Speech and behavior are normal.  ____________________________________________   LABS (all labs ordered are listed, but only abnormal results are displayed)  Labs Reviewed  BASIC METABOLIC PANEL - Abnormal; Notable for the following components:      Result Value   Glucose, Bld 110 (*)    All other components within  normal limits  CBC  TROPONIN I   ____________________________________________  EKG  ED ECG REPORT I, Loleta Rose, the attending physician, personally viewed and interpreted this ECG.  Date: 08/11/2018 EKG Time: 17: 14 Rate: 92 Rhythm: normal sinus rhythm QRS Axis: normal Intervals: normal ST/T Wave abnormalities: normal Narrative Interpretation: no evidence of acute ischemia  ____________________________________________  RADIOLOGY I, Loleta Rose, personally viewed and evaluated these images (plain radiographs) as part of my medical decision making, as well as reviewing the written report by the radiologist.  ED MD interpretation: No acute abnormalities on chest x-ray  Official radiology report(s): Dg Chest 2 View  Result Date: 08/11/2018 CLINICAL DATA:  Chest pain radiating to left neck and arm EXAM: CHEST - 2 VIEW COMPARISON:  CT 06/09/2018 FINDINGS: Heart is normal size. No confluent airspace opacities or effusions. No acute bony abnormality. IMPRESSION: No active cardiopulmonary disease. Electronically Signed   By: Charlett Nose M.D.   On: 08/11/2018 18:08    ____________________________________________   PROCEDURES   Procedure(s) performed (including Critical Care):  Procedures   ____________________________________________   INITIAL IMPRESSION / MDM / ASSESSMENT AND PLAN / ED COURSE  As part of my medical decision making, I reviewed the following data within the electronic MEDICAL RECORD NUMBER History obtained from family, Nursing notes reviewed and incorporated, Labs reviewed , EKG interpreted , Old chart reviewed, Radiograph reviewed  and Notes from prior ED visits  HEAR Score: 4    Differential diagnosis includes, but is not limited to, ACS, musculoskeletal pain, anxiety/stress, PE, pneumonia, pneumothorax.  The patient is very well-appearing and in no distress.  She has been having intermittent symptoms for about 13 to 14 hours.  She is in good health in  spite of her age, active, and does not want to be here.  Her lab work including metabolic panel, CBC, and troponin are all within normal limits.  She has a normal EKG with no evidence of ischemia and a normal chest x-ray.  Vital signs are within normal limits.  Although her HEAR score gives her an elevated risk, I believe that she is an appropriate candidate for discharge and outpatient follow-up.  I offered to check a second troponin but do not think it is necessary  given that her symptoms started almost 14 hours ago.  The patient and her daughter are comfortable without getting the repeat troponin and she just wants to go home and follow-up as an outpatient.  I think this is very appropriate.  She already takes a daily baby aspirin and I encouraged her to continue taking it.  I have also given her follow-up information with cardiology.  I gave my usual customary return precautions and she and her daughter understand and agree with the plan.     ____________________________________________  FINAL CLINICAL IMPRESSION(S) / ED DIAGNOSES  Final diagnoses:  Chest pain, unspecified type     MEDICATIONS GIVEN DURING THIS VISIT:  Medications  sodium chloride flush (NS) 0.9 % injection 3 mL (3 mLs Intravenous Given 08/11/18 2000)     ED Discharge Orders    None       Note:  This document was prepared using Dragon voice recognition software and may include unintentional dictation errors.   Loleta Rose, MD 08/11/18 2320

## 2018-08-11 NOTE — Discharge Instructions (Signed)

## 2018-08-11 NOTE — ED Triage Notes (Signed)
Reports left sided chest pain that radiates to Left neck and left arm since 0800. Reports she feels it "twisting"

## 2018-11-06 ENCOUNTER — Other Ambulatory Visit: Payer: Self-pay

## 2018-11-06 ENCOUNTER — Encounter: Payer: Self-pay | Admitting: Emergency Medicine

## 2018-11-06 ENCOUNTER — Emergency Department: Payer: BLUE CROSS/BLUE SHIELD

## 2018-11-06 ENCOUNTER — Emergency Department
Admission: EM | Admit: 2018-11-06 | Discharge: 2018-11-06 | Disposition: A | Discharge status: Home / Self Care | Payer: BLUE CROSS/BLUE SHIELD | Attending: Student in an Organized Health Care Education/Training Program | Admitting: Student in an Organized Health Care Education/Training Program

## 2018-11-06 DIAGNOSIS — Z87891 Personal history of nicotine dependence: Secondary | ICD-10-CM | POA: Diagnosis not present

## 2018-11-06 DIAGNOSIS — Z7982 Long term (current) use of aspirin: Secondary | ICD-10-CM | POA: Insufficient documentation

## 2018-11-06 DIAGNOSIS — R0789 Other chest pain: Secondary | ICD-10-CM | POA: Insufficient documentation

## 2018-11-06 DIAGNOSIS — R079 Chest pain, unspecified: Secondary | ICD-10-CM

## 2018-11-06 DIAGNOSIS — J449 Chronic obstructive pulmonary disease, unspecified: Secondary | ICD-10-CM | POA: Diagnosis not present

## 2018-11-06 DIAGNOSIS — R071 Chest pain on breathing: Secondary | ICD-10-CM | POA: Diagnosis not present

## 2018-11-06 DIAGNOSIS — Z79899 Other long term (current) drug therapy: Secondary | ICD-10-CM | POA: Diagnosis not present

## 2018-11-06 LAB — BASIC METABOLIC PANEL
Anion gap: 8 (ref 5–15)
BUN: 17 mg/dL (ref 8–23)
CO2: 24 mmol/L (ref 22–32)
Calcium: 8.8 mg/dL — ABNORMAL LOW (ref 8.9–10.3)
Chloride: 108 mmol/L (ref 98–111)
Creatinine, Ser: 0.61 mg/dL (ref 0.44–1.00)
GFR calc Af Amer: >60 mL/min (ref >60)
GFR calc non Af Amer: >60 mL/min (ref >60)
Glucose, Bld: 167 mg/dL — ABNORMAL HIGH (ref 70–99)
Potassium: 4 mmol/L (ref 3.5–5.1)
Sodium: 140 mmol/L (ref 135–145)

## 2018-11-06 LAB — CBC
HCT: 38.7 % (ref 36.0–46.0)
Hemoglobin: 13 g/dL (ref 12.0–15.0)
MCH: 32 pg (ref 26.0–34.0)
MCHC: 33.6 g/dL (ref 30.0–36.0)
MCV: 95.3 fL (ref 80.0–100.0)
Platelets: 186 10*3/uL (ref 150–400)
RBC: 4.06 MIL/uL (ref 3.87–5.11)
RDW: 11.6 % (ref 11.5–15.5)
WBC: 6.9 10*3/uL (ref 4.0–10.5)
nRBC: 0 % (ref 0.0–0.2)

## 2018-11-06 LAB — TROPONIN I: Troponin I: <0.03 ng/mL (ref <0.03)

## 2018-11-06 MED ORDER — IOHEXOL 350 MG/ML SOLN
75.0000 mL | Freq: Once | INTRAVENOUS | Status: AC | PRN
  Administered 2018-11-06: 75 mL via INTRAVENOUS

## 2018-11-06 MED ORDER — ACETAMINOPHEN 500 MG PO TABS
1000.0000 mg | ORAL_TABLET | Freq: Once | ORAL | Status: AC
  Administered 2018-11-06: 1000 mg via ORAL
  Filled 2018-11-06: qty 2

## 2018-11-06 MED ORDER — LIDOCAINE 5 % EX PTCH: 1.0000 | MEDICATED_PATCH | Freq: Two times a day (BID) | CUTANEOUS | 0 refills | Status: AC

## 2018-11-06 NOTE — ED Provider Notes (Signed)
[facility] Emergency Department Provider Note    First MD Initiated Contact with Patient 11/06/18 2130     (approximate)  I have reviewed the triage vital signs and the nursing notes.   HISTORY  Chief Complaint Chest Pain    HPI Sandy Eaton is a 78 y.o. female the below listed past medical history presents the ER for right-sided posterior chest pain that hurts when she takes a deep breath for the past week and a half.  Also worsen with movement.  States it feels like she has pneumonia again.  Has not had any fevers.  Not have any cough or exertional shortness of breath.  No nausea or vomiting.  No abdominal pain.    Past Medical History:  Diagnosis Date  . Asthma   . COPD (chronic obstructive pulmonary disease) (HCC)    Suspected  . Environmental allergies   . GERD (gastroesophageal reflux disease)   . Hyperlipidemia   . Pneumonia    3 times   Family History  Problem Relation Age of Onset  . COPD Father   . Asthma Daughter   . Asthma Grandchild   . Cancer Daughter        breast  . Breast cancer Daughter    Past Surgical History:  Procedure Laterality Date  . APPENDECTOMY    . FEMORAL HERNIA REPAIR Right 01/15/2017   Procedure: HERNIA REPAIR FEMORAL;  Surgeon: Nadeen LandauSmith, Jarvis Wilton, MD;  Location: [facility] ORS;  Service: General;  Laterality: Right;  . ROTATOR CUFF REPAIR Right   . TONSILLECTOMY    . VAGINAL HYSTERECTOMY     1960's   Patient Active Problem List   Diagnosis Date Noted  . COPD (chronic obstructive pulmonary disease) (HCC) 06/09/2018  . Pneumonia 05/05/2015  . Hospital acquired PNA 11/26/2014  . SOB (shortness of breath) 02/24/2014  . DOE (dyspnea on exertion) 02/24/2014  . Cough 02/24/2014  . COPD bronchitis 02/24/2014      Prior to Admission medications   Medication Sig Start Date End Date Taking? Authorizing Provider  acetaminophen (TYLENOL) 500 MG tablet Take 1,000 mg by mouth 2 (two) times daily as needed for  mild pain or headache.    [provider]  albuterol (PROVENTIL) (2.5 MG/3ML) 0.083% nebulizer solution Take 3 mLs (2.5 mg total) by nebulization every 6 (six) hours as needed for wheezing or shortness of breath. 06/11/18   Salary, Evelena AsaMontell D, MD  Albuterol Sulfate (PROAIR RESPICLICK) 108 (90 Base) MCG/ACT AEPB Inhale 2 puffs into the lungs every 4 (four) hours as needed. 06/11/18   Salary, Jetty DuhamelMontell D, MD  aspirin EC 81 MG tablet Take 81 mg by mouth daily.    [provider]  atorvastatin (LIPITOR) 40 MG tablet Take 40 mg by mouth at bedtime.     [provider]  benzonatate (TESSALON) 100 MG capsule Take 1 capsule (100 mg total) by mouth 3 (three) times daily as needed for cough. 07/09/17   Merwyn KatosSimonds, David B, MD  budesonide-formoterol (SYMBICORT) 80-4.5 MCG/ACT inhaler Inhale 2 puffs into the lungs 2 (two) times daily. 06/11/18   Salary, Evelena AsaMontell D, MD  cetirizine (ZYRTEC) 10 MG tablet Take 10 mg by mouth daily.     [provider]  diphenhydrAMINE (BENADRYL) 25 MG tablet Take 25 mg by mouth at bedtime as needed.    [provider]  doxycycline (VIBRA-TABS) 100 MG tablet Take 1 tablet (100 mg total) by mouth every 12 (twelve) hours. 06/11/18   Salary, Jetty DuhamelMontell  D, MD  fluticasone (FLONASE) 50 MCG/ACT nasal spray Place 1 spray into both nostrils daily as needed for rhinitis.     [provider]  HYDROcodone-acetaminophen (NORCO) 5-325 MG tablet Take 1-2 tablets by mouth every 4 (four) hours as needed for moderate pain. 01/15/17   Nadeen Landau, MD  omeprazole (PRILOSEC) 40 MG capsule Take 40 mg by mouth daily.     [provider]  pantoprazole (PROTONIX) 40 MG tablet Take 1 tablet (40 mg total) by mouth daily. 06/12/18   Salary, Evelena Asa, MD  predniSONE (DELTASONE) 50 MG tablet 1 po daily 06/11/18   Salary, Evelena Asa, MD  Respiratory Therapy Supplies (FLUTTER) DEVI Use 10-15 times daily 12/24/17   Merwyn Katos, MD  Respiratory Therapy  Supplies (FLUTTER) DEVI 1 each by Does not apply route 2 (two) times daily. 05/28/18   Merwyn Katos, MD  tetrahydrozoline-zinc (VISINE-AC) 0.05-0.25 % ophthalmic solution Place 1 drop into both eyes 3 (three) times daily as needed (irritated eyes).    [provider]    Allergies Augmentin [amoxicillin-pot clavulanate] and Valium [diazepam]    Social History Social History   Tobacco Use  . Smoking status: Former Smoker    Packs/day: 1.00    Years: 30.00    Pack years: 30.00    Types: Cigarettes    Last attempt to quit: 02/24/2009    Years since quitting: 9.7  . Smokeless tobacco: Never Used  Substance Use Topics  . Alcohol use: No  . Drug use: No    Review of Systems Patient denies headaches, rhinorrhea, blurry vision, numbness, shortness of breath, chest pain, edema, cough, abdominal pain, nausea, vomiting, diarrhea, dysuria, fevers, rashes or hallucinations unless otherwise stated above in HPI. ____________________________________________   PHYSICAL EXAM:  VITAL SIGNS: Vitals:   11/06/18 1725 11/06/18 2130  BP: 101/63 120/82  Pulse: 86   Resp: 16 16  Temp: 98.8 F (37.1 C)   SpO2: 94%     Constitutional: Alert and oriented.  Eyes: Conjunctivae are normal.  Head: Atraumatic. Nose: No congestion/rhinnorhea. Mouth/Throat: Mucous membranes are moist.   Neck: No stridor. Painless ROM.  Cardiovascular: Normal rate, regular rhythm. Grossly normal heart sounds.  Good peripheral circulation. Respiratory: Normal respiratory effort.  No retractions. Lungs CTAB. Gastrointestinal: Soft and nontender. No distention. No abdominal bruits. No CVA tenderness. Genitourinary:  Musculoskeletal: No lower extremity tenderness nor edema.  No joint effusions. Neurologic:  Normal speech and language. No gross focal neurologic deficits are appreciated. No facial droop Skin:  Skin is warm, dry and intact. No rash noted. Psychiatric: Mood and affect are normal. Speech and  behavior are normal.  ____________________________________________   LABS (all labs ordered are listed, but only abnormal results are displayed)  Results for orders placed or performed during the hospital encounter of 11/06/18 (from the past 24 hour(s))  Basic metabolic panel     Status: Abnormal   Collection Time: 11/06/18  5:32 PM  Result Value Ref Range   Sodium 140 135 - 145 mmol/L   Potassium 4.0 3.5 - 5.1 mmol/L   Chloride 108 98 - 111 mmol/L   CO2 24 22 - 32 mmol/L   Glucose, Bld 167 (H) 70 - 99 mg/dL   BUN 17 8 - 23 mg/dL   Creatinine, Ser 9.14 0.44 - 1.00 mg/dL   Calcium 8.8 (L) 8.9 - 10.3 mg/dL   GFR calc non Af Amer >60 >60 mL/min   GFR calc Af Amer >60 >60 mL/min  Anion gap 8 5 - 15  CBC     Status: None   Collection Time: 11/06/18  5:32 PM  Result Value Ref Range   WBC 6.9 4.0 - 10.5 K/uL   RBC 4.06 3.87 - 5.11 MIL/uL   Hemoglobin 13.0 12.0 - 15.0 g/dL   HCT 09.8 11.9 - 14.7 %   MCV 95.3 80.0 - 100.0 fL   MCH 32.0 26.0 - 34.0 pg   MCHC 33.6 30.0 - 36.0 g/dL   RDW 82.9 56.2 - 13.0 %   Platelets 186 150 - 400 K/uL   nRBC 0.0 0.0 - 0.2 %  Troponin I - ONCE - STAT     Status: None   Collection Time: 11/06/18  5:32 PM  Result Value Ref Range   Troponin I <0.03 <0.03 ng/mL   ____________________________________________  EKG My review and personal interpretation at Time: 17:23   Indication: chest pain  Rate: 90  Rhythm: sinus Axis: normal Other: normal intervals, no stemi ____________________________________________  RADIOLOGY  I personally reviewed all radiographic images ordered to evaluate for the above acute complaints and reviewed radiology reports and findings.  These findings were personally discussed with the patient.  Please see medical record for radiology report.  ____________________________________________   PROCEDURES  Procedure(s) performed:  Procedures    Critical Care performed: no ____________________________________________    INITIAL IMPRESSION / ASSESSMENT AND PLAN / ED COURSE  Pertinent labs & imaging results that were available during my care of the patient were reviewed by me and considered in my medical decision making (see chart for details).   DDX: ACS, pericarditis, esophagitis, boerhaaves, pe, dissection, pna, bronchitis, costochondritis   Sandy Eaton is a 78 y.o. who presents to the ED with chest pain as described above.  Seems primarily musculoskeletal.  EKG and troponin are nonischemic after a week and a half of pain.  Not consistent with ACS.  No signs or symptoms of COVID-19.  CT imaging does not show any evidence of PE mass or fracture.  Patient stable and appropriate for outpatient follow-up     The patient was evaluated in Emergency Department today for the symptoms described in the history of present illness. He/she was evaluated in the context of the global COVID-19 pandemic, which necessitated consideration that the patient might be at risk for infection with the SARS-CoV-2 virus that causes COVID-19. Institutional protocols and algorithms that pertain to the evaluation of patients at risk for COVID-19 are in a state of rapid change based on information released by regulatory bodies including the CDC and federal and state organizations. These policies and algorithms were followed during the patient's care in the ED.  As part of my medical decision making, I reviewed the following data within the electronic MEDICAL RECORD NUMBER Nursing notes reviewed and incorporated, Labs reviewed, notes from prior ED visits and [place] Controlled Substance Database   ____________________________________________   FINAL CLINICAL IMPRESSION(S) / ED DIAGNOSES  Final diagnoses:  Chest wall pain      NEW MEDICATIONS STARTED DURING THIS VISIT:  New Prescriptions   No medications on file     Note:  This document was prepared using Dragon voice recognition software and may include unintentional dictation errors.     Willy Eddy, MD 11/06/18 [phone]

## 2018-11-06 NOTE — ED Notes (Signed)
Pt to ct 

## 2018-11-06 NOTE — ED Notes (Signed)
Vitals Stable. [id number].

## 2018-11-06 NOTE — ED Notes (Signed)
Patient returned from CT

## 2018-11-06 NOTE — ED Triage Notes (Signed)
Patient reports right-sided chest pain radiating to back x1.5 weeks. Patient reports improvement with Tylenol. Patient also states SOB x2 days. Patient denies cough or fever. Denies contact with anyone sick.

## 2018-11-07 ENCOUNTER — Other Ambulatory Visit: Payer: Self-pay | Admitting: Neurology

## 2018-11-18 ENCOUNTER — Ambulatory Visit: Payer: BLUE CROSS/BLUE SHIELD | Admitting: Cardiovascular Disease

## 2018-12-31 ENCOUNTER — Telehealth: Payer: Self-pay | Admitting: Pulmonary Disease

## 2018-12-31 NOTE — Telephone Encounter (Signed)
Called patient for COVID-19 pre-screening for in office visit.  Have you recently traveled any where out of the local area in the last 2 weeks? no  Have you been in close contact with a person diagnosed with COVID-19 or someone awaiting results within the last 2 weeks? Unsure, as she works at [employer] currently have any of the following symptoms? If so, when did they start? Cough-worsen over the past few weeks     Diarrhea   Joint Pain Fever      Muscle Pain   Red eyes-on sunday Shortness of breath-worsen over the past few weeks   Abdominal pain  Vomiting Loss of smell    Rash    Sore Throat Headache    Weakness   Bruising or bleeding   Visit switched to phone visit due to new/worsen sx.

## 2019-01-02 ENCOUNTER — Other Ambulatory Visit: Payer: Self-pay

## 2019-01-02 ENCOUNTER — Encounter: Payer: Self-pay | Admitting: Pulmonary Disease

## 2019-01-02 ENCOUNTER — Ambulatory Visit (INDEPENDENT_AMBULATORY_CARE_PROVIDER_SITE_OTHER): Payer: BC Managed Care – PPO | Admitting: Pulmonary Disease

## 2019-01-02 DIAGNOSIS — J398 Other specified diseases of upper respiratory tract: Secondary | ICD-10-CM

## 2019-01-02 DIAGNOSIS — J449 Chronic obstructive pulmonary disease, unspecified: Secondary | ICD-10-CM | POA: Diagnosis not present

## 2019-01-02 MED ORDER — BENZONATATE 100 MG PO CAPS: 100.0000 mg | ORAL_CAPSULE | Freq: Three times a day (TID) | ORAL | 10 refills | Status: DC | PRN

## 2019-01-02 MED ORDER — PREDNISONE 20 MG PO TABS: 40.0000 mg | ORAL_TABLET | Freq: Every day | ORAL | 0 refills | Status: AC

## 2019-01-02 NOTE — Patient Instructions (Addendum)
Continue Symbicort 80-4.5, 2 inhalations twice a day.  Rinse mouth after use Continue albuterol inhaler as needed for increased shortness of breath, wheezing, chest tightness, cough Continue nebulized albuterol as needed for when the inhaler does not sufficiently relieve symptoms Continue Zyrtec 10 mg daily Continue Flonase 1-2 sprays per nostril daily Continue omeprazole 40 mg daily Continue Tessalon Perles as needed for cough.  Prescription refilled New prescription: Prednisone 40 mg (2 tabs 20 mg) daily for 5 days.  Prescription entered  Follow-up in this office in 3-4 months.  Call sooner as needed.

## 2019-01-02 NOTE — Progress Notes (Signed)
PROBLEMS: Hospitalization 11/17-11/18/16 for PNA Former smoker Chronic asthmatic bronchitis Normal PFTs 06/22/15  INTERVAL HISTORY: Last visit 05/2018.  Seen in ED 11/06/18 for pleuritic CP. This problem has resolved  Virtual Visit via Telephone Note I connected with Sandy Eaton on 01/02/19 at 10:45 AM EDT by telephone and verified that I am speaking with the correct person using two identifiers. I discussed the limitations, risks, security and privacy concerns of performing an evaluation and management service by telephone and the availability of in person appointments. I also discussed with the patient that there may be a patient responsible charge related to this service. The patient expressed understanding and agreed to proceed.    SUBJ: This is a scheduled follow-up which was performed remotely due to the coronavirus pandemic.  Overall, since last visit in February, she has been about the same.  She has chronic daily cough with mucus production.  Over the past 2-3 weeks, her cough has worsened with mucus described as white to pale tan. She does have mild increase in shortness of breath.  She also describes rhinorrhea and nasal congestion with intermittent headaches. Denies CP, fever, purulent sputum, hemoptysis, LE edema and calf tenderness. She remains on Symbicort inhaler and is using her flutter valve a couple of times per day.  She is using Flonase daily as well as cetirizine.  She requests a refill of the [employer]  She would also like to try a short course of prednisone to see if the above symptoms resolve.    OBJ: There were no vitals filed for this visit.  Due to the remote nature of this encounter, no physical examination could be performed    DATA: BMP Latest Ref Rng & Units 11/06/2018 08/11/2018 06/10/2018  Glucose 70 - 99 mg/dL 167(H) 110(H) 202(H)  BUN 8 - 23 mg/dL 17 18 13   Creatinine 0.44 - 1.00 mg/dL 0.61 0.49 0.56  Sodium 135 - 145 mmol/L 140 139 137   Potassium 3.5 - 5.1 mmol/L 4.0 3.9 3.8  Chloride 98 - 111 mmol/L 108 108 104  CO2 22 - 32 mmol/L 24 24 21(L)  Calcium 8.9 - 10.3 mg/dL 8.8(L) 8.9 8.5(L)    CBC Latest Ref Rng & Units 11/06/2018 08/11/2018 06/10/2018  WBC 4.0 - 10.5 K/uL 6.9 6.4 4.6  Hemoglobin 12.0 - 15.0 g/dL 13.0 13.7 13.4  Hematocrit 36.0 - 46.0 % 38.7 41.2 40.6  Platelets 150 - 400 K/uL 186 184 156   CXR 11/06/18: Normal CT chest 11/06/18: No PE. No acute findings  IMPRESSION:   ICD-10-CM   1. Chronic asthmatic bronchitis ([place])  J44.9   2. 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  Also with symptoms of chronic rhinosinusitis.   PLAN: Continue Symbicort 80-4.5, 2 inhalations twice a day.  Rinse mouth after use Continue albuterol inhaler as needed for increased shortness of breath, wheezing, chest tightness, cough Continue nebulized albuterol as needed for when the inhaler does not sufficiently relieve symptoms Continue Zyrtec 10 mg daily Continue Flonase 1-2 sprays per nostril daily Continue omeprazole 40 mg daily Continue Tessalon Perles as needed for cough.  Prescription refilled New prescription: Prednisone 40 mg (2 tabs 20 mg) daily for 5 days.  Prescription entered  Follow-up in this office in 3-4 months.  Call sooner as needed.    Merton Border, MD PCCM service Mobile [phone] Pager [phone] 01/02/2019 11:15 AM

## 2019-03-11 ENCOUNTER — Other Ambulatory Visit: Payer: Self-pay | Admitting: Family Medicine

## 2019-03-11 DIAGNOSIS — Z9889 Other specified postprocedural states: Secondary | ICD-10-CM

## 2019-03-11 DIAGNOSIS — Z8719 Personal history of other diseases of the digestive system: Secondary | ICD-10-CM

## 2019-03-11 DIAGNOSIS — R1031 Right lower quadrant pain: Secondary | ICD-10-CM

## 2019-06-01 ENCOUNTER — Other Ambulatory Visit: Payer: Self-pay

## 2019-06-01 DIAGNOSIS — Z20822 Contact with and (suspected) exposure to covid-19: Secondary | ICD-10-CM

## 2019-06-03 LAB — NOVEL CORONAVIRUS, NAA: SARS-CoV-2, NAA: NOT DETECTED

## 2019-06-06 ENCOUNTER — Other Ambulatory Visit: Payer: Self-pay

## 2019-06-06 ENCOUNTER — Ambulatory Visit
Admission: EM | Admit: 2019-06-06 | Discharge: 2019-06-06 | Disposition: A | Discharge status: Home / Self Care | Payer: BC Managed Care – PPO | Attending: Family Medicine | Admitting: Family Medicine

## 2019-06-06 ENCOUNTER — Encounter: Payer: Self-pay | Admitting: Emergency Medicine

## 2019-06-06 DIAGNOSIS — J441 Chronic obstructive pulmonary disease with (acute) exacerbation: Secondary | ICD-10-CM | POA: Diagnosis not present

## 2019-06-06 MED ORDER — DOXYCYCLINE HYCLATE 100 MG PO TABS: 100.0000 mg | ORAL_TABLET | Freq: Two times a day (BID) | ORAL | 0 refills | Status: DC

## 2019-06-06 MED ORDER — PREDNISONE 20 MG PO TABS: ORAL_TABLET | ORAL | 0 refills | Status: DC

## 2019-06-06 NOTE — ED Triage Notes (Signed)
Patient had covid test earlier today and results are pending. Patient also had a covid test on 06/01/19 and it was negative.

## 2019-06-06 NOTE — ED Triage Notes (Signed)
Patient in today c/o headache, cough, sob and nasal congestion x 2-3 weeks. Patient has taken Tylenol and Ibuprofen. Patient denies fever.

## 2019-06-06 NOTE — ED Provider Notes (Signed)
[facility]    CSN: [id number] Arrival date & time: 06/06/19  1112      History   Chief Complaint Chief Complaint  Patient presents with  . Headache  . Nasal Congestion  . Shortness of Breath  . Cough    HPI Sandy Eaton is a 78 y.o. female.   78 yo female with a c/o cough, wheezing and shortness of breath for the past 3 weeks. Patient has COPD and has been using her regular inhalers. Denies any fevers, chills, chest pains. States she was tested for covid and was negative.      Past Medical History:  Diagnosis Date  . Asthma   . COPD (chronic obstructive pulmonary disease) (HCC)    Suspected  . Environmental allergies   . GERD (gastroesophageal reflux disease)   . Hyperlipidemia   . Pneumonia    3 times    Patient Active Problem List   Diagnosis Date Noted  . Chronic asthmatic bronchitis (HCC) 01/02/2019  . Pneumonia 05/05/2015  . Hospital acquired PNA 11/26/2014  . DOE (dyspnea on exertion) 02/24/2014  . Cough 02/24/2014    Past Surgical History:  Procedure Laterality Date  . APPENDECTOMY    . FEMORAL HERNIA REPAIR Right 01/15/2017   Procedure: HERNIA REPAIR FEMORAL;  Surgeon: Nadeen Landau, MD;  Location: [facility] ORS;  Service: General;  Laterality: Right;  . ROTATOR CUFF REPAIR Right   . TONSILLECTOMY    . VAGINAL HYSTERECTOMY     1960's    OB History    Gravida  3   Para      Term      Preterm      AB      Living  2     SAB      TAB      Ectopic      Multiple      Live Births               Home Medications    Prior to Admission medications   Medication Sig Start Date End Date Taking? Authorizing Provider  acetaminophen (TYLENOL) 500 MG tablet Take 1,000 mg by mouth 2 (two) times daily as needed for mild pain or headache.   Yes [provider]  albuterol (PROVENTIL) (2.5 MG/3ML) 0.083% nebulizer solution Take 3 mLs (2.5 mg total) by nebulization every 6 (six) hours as needed for wheezing or  shortness of breath. 06/11/18  Yes Salary, Montell D, MD  Albuterol Sulfate (PROAIR RESPICLICK) 108 (90 Base) MCG/ACT AEPB Inhale 2 puffs into the lungs every 4 (four) hours as needed. 06/11/18  Yes Salary, Evelena Asa, MD  aspirin EC 81 MG tablet Take 81 mg by mouth daily.   Yes [provider]  atorvastatin (LIPITOR) 40 MG tablet Take 40 mg by mouth at bedtime.    Yes [provider]  benzonatate (TESSALON) 100 MG capsule Take 1 capsule (100 mg total) by mouth 3 (three) times daily as needed for cough. 01/02/19  Yes Merwyn Katos, MD  budesonide-formoterol (SYMBICORT) 80-4.5 MCG/ACT inhaler Inhale 2 puffs into the lungs 2 (two) times daily. 06/11/18  Yes Salary, Evelena Asa, MD  fluticasone (FLONASE) 50 MCG/ACT nasal spray Place 1 spray into both nostrils daily as needed for rhinitis.    Yes [provider]  lidocaine (LIDODERM) 5 % Place 1 patch onto the skin every 12 (twelve) hours. Remove & Discard patch within 12 hours or as directed by MD 11/06/18 11/06/19 Yes  Merlyn Lot, MD  montelukast (SINGULAIR) 10 MG tablet Take 10 mg by mouth at bedtime. 05/12/19  Yes [provider]  omeprazole (PRILOSEC) 40 MG capsule Take 40 mg by mouth daily.    Yes [provider]  Respiratory Therapy Supplies (FLUTTER) DEVI Use 10-15 times daily 12/24/17  Yes Wilhelmina Mcardle, MD  cetirizine (ZYRTEC) 10 MG tablet Take 10 mg by mouth daily.     [provider]  diphenhydrAMINE (BENADRYL) 25 MG tablet Take 25 mg by mouth at bedtime as needed.    [provider]  doxycycline (VIBRA-TABS) 100 MG tablet Take 1 tablet (100 mg total) by mouth 2 (two) times daily. 06/06/19   Norval Gable, MD  predniSONE (DELTASONE) 20 MG tablet 3 tabs po qd x 2 days, then 2 tabs po qd x 3 days, then 1 tab po qd x 3 days, then half a tab po qd x 2 days 06/06/19   Norval Gable, MD  tetrahydrozoline-zinc (VISINE-AC) 0.05-0.25 % ophthalmic solution Place 1 drop into both eyes 3  (three) times daily as needed (irritated eyes).    [provider]    Family History Family History  Problem Relation Age of Onset  . COPD Father   . Asthma Daughter   . Asthma Grandchild   . Cancer Daughter        breast  . Breast cancer Daughter   . Other Mother        MVA    Social History Social History   Tobacco Use  . Smoking status: Former Smoker    Packs/day: 1.00    Years: 30.00    Pack years: 30.00    Types: Cigarettes    Quit date: 02/24/2009    Years since quitting: 10.2  . Smokeless tobacco: Never Used  Substance Use Topics  . Alcohol use: No  . Drug use: No     Allergies   Augmentin [amoxicillin-pot clavulanate], Escitalopram oxalate, and Valium [diazepam]   Review of Systems Review of Systems   Physical Exam Triage Vital Signs ED Triage Vitals  Enc Vitals Group     BP 06/06/19 1132 108/77     Pulse Rate 06/06/19 1132 81     Resp 06/06/19 1132 18     Temp 06/06/19 1132 98.1 F (36.7 C)     Temp Source 06/06/19 1132 Oral     SpO2 06/06/19 1132 97 %     Weight 06/06/19 1131 137 lb (62.1 kg)     Height 06/06/19 1131 5\' 3"  (1.6 m)     Head Circumference --      Peak Flow --      Pain Score 06/06/19 1131 4     Pain Loc --      Pain Edu? --      Excl. in [place]? --    No data found.  Updated Vital Signs BP 108/77 (BP Location: Left Arm)   Pulse 81   Temp 98.1 F (36.7 C) (Oral)   Resp 18   Ht 5\' 3"  (1.6 m)   Wt 62.1 kg   SpO2 97%   BMI 24.27 kg/m   Visual Acuity Right Eye Distance:   Left Eye Distance:   Bilateral Distance:    Right Eye Near:   Left Eye Near:    Bilateral Near:     Physical Exam Vitals and nursing note reviewed.  Constitutional:      General: She is not in acute distress.    Appearance: She  is not toxic-appearing or diaphoretic.  Cardiovascular:     Rate and Rhythm: Normal rate.  Pulmonary:     Effort: Pulmonary effort is normal. No respiratory distress.     Breath sounds: No stridor. Wheezing  and rhonchi present. No rales.  Neurological:     Mental Status: She is alert.      UC Treatments / Results  Labs (all labs ordered are listed, but only abnormal results are displayed) Labs Reviewed - No data to display  EKG   Radiology No results found.  Procedures Procedures (including critical care time)  Medications Ordered in UC Medications - No data to display  Initial Impression / Assessment and Plan / UC Course  I have reviewed the triage vital signs and the nursing notes.  Pertinent labs & imaging results that were available during my care of the patient were reviewed by me and considered in my medical decision making (see chart for details).      Final Clinical Impressions(s) / UC Diagnoses   Final diagnoses:  COPD exacerbation [facility](HCC)    ED Prescriptions    Medication Sig Dispense Auth. Provider   doxycycline (VIBRA-TABS) 100 MG tablet Take 1 tablet (100 mg total) by mouth 2 (two) times daily. 20 tablet Payton Mccallumonty, Kahron Kauth, MD   predniSONE (DELTASONE) 20 MG tablet 3 tabs po qd x 2 days, then 2 tabs po qd x 3 days, then 1 tab po qd x 3 days, then half a tab po qd x 2 days 16 tablet Kimberely Mccannon, MD      1. diagnosis reviewed with patient 2. rx as per orders above; reviewed possible side effects, interactions, risks and benefits  3. Continue current inhalers 4. Follow-up prn if symptoms worsen or don't improve  PDMP not reviewed this encounter.   Payton Mccallumonty, Veleda Mun, MD 06/06/19 [phone]

## 2019-06-16 ENCOUNTER — Ambulatory Visit
Admission: RE | Admit: 2019-06-16 | Discharge: 2019-06-16 | Disposition: A | Discharge status: Home / Self Care | Payer: BC Managed Care – PPO | Source: Ambulatory Visit | Attending: Internal Medicine | Admitting: Internal Medicine

## 2019-06-16 ENCOUNTER — Other Ambulatory Visit: Payer: Self-pay | Admitting: Family Medicine

## 2019-06-16 ENCOUNTER — Ambulatory Visit
Admission: RE | Admit: 2019-06-16 | Discharge: 2019-06-16 | Disposition: A | Discharge status: Home / Self Care | Payer: BC Managed Care – PPO | Source: Ambulatory Visit | Attending: Family Medicine | Admitting: Family Medicine

## 2019-06-16 DIAGNOSIS — R634 Abnormal weight loss: Secondary | ICD-10-CM | POA: Diagnosis not present

## 2019-06-24 ENCOUNTER — Ambulatory Visit: Payer: BC Managed Care – PPO | Attending: Internal Medicine

## 2019-06-24 DIAGNOSIS — Z20822 Contact with and (suspected) exposure to covid-19: Secondary | ICD-10-CM

## 2019-06-26 ENCOUNTER — Telehealth: Payer: Self-pay | Admitting: *Deleted

## 2019-06-26 LAB — NOVEL CORONAVIRUS, NAA: SARS-CoV-2, NAA: NOT DETECTED

## 2019-06-26 NOTE — Telephone Encounter (Signed)
Patient called given negative covid results . 

## 2019-08-20 ENCOUNTER — Ambulatory Visit
Admission: EM | Admit: 2019-08-20 | Discharge: 2019-08-20 | Disposition: A | Discharge status: Home / Self Care | Payer: BC Managed Care – PPO | Attending: Urgent Care | Admitting: Urgent Care

## 2019-08-20 ENCOUNTER — Other Ambulatory Visit: Payer: Self-pay

## 2019-08-20 ENCOUNTER — Ambulatory Visit (INDEPENDENT_AMBULATORY_CARE_PROVIDER_SITE_OTHER): Payer: BC Managed Care – PPO

## 2019-08-20 DIAGNOSIS — J302 Other seasonal allergic rhinitis: Secondary | ICD-10-CM | POA: Insufficient documentation

## 2019-08-20 DIAGNOSIS — J441 Chronic obstructive pulmonary disease with (acute) exacerbation: Secondary | ICD-10-CM | POA: Insufficient documentation

## 2019-08-20 DIAGNOSIS — R05 Cough: Secondary | ICD-10-CM | POA: Diagnosis not present

## 2019-08-20 MED ORDER — PREDNISONE 10 MG (21) PO TBPK: ORAL_TABLET | Freq: Every day | ORAL | 0 refills | Status: DC

## 2019-08-20 MED ORDER — CETIRIZINE HCL 10 MG PO TABS: 10.0000 mg | ORAL_TABLET | Freq: Every day | ORAL | 0 refills | Status: DC

## 2019-08-20 MED ORDER — DOXYCYCLINE HYCLATE 100 MG PO TABS: 100.0000 mg | ORAL_TABLET | Freq: Two times a day (BID) | ORAL | 0 refills | Status: AC

## 2019-08-20 NOTE — Discharge Instructions (Addendum)
It was very nice seeing you today in clinic. Thank you for entrusting me with your care.   Rest and make sure you are staying hydrated. Use your allergy medication. I have sent in an antibiotic and steroids for you to use.   Make arrangements to follow up with your regular doctor in 1 week for re-evaluation if not improving. If your symptoms/condition worsens, please seek follow up care either here or in the ER. Please remember, our [facility] providers are "right here with you" when you need [place].   Again, it was my pleasure to take care of you today. Thank you for choosing our clinic. I hope that you start to feel better quickly.   Quentin Mulling, MSN, APRN, FNP-C, CEN Advanced Practice Provider [facility]

## 2019-08-20 NOTE — ED Triage Notes (Signed)
Pt presents with c/o chest tightness radiating into her back, shob, runny nose and eyes and headache. She states symptoms have been present for about 3 weeks. Pt denies cough, fever/chills or other symptoms. Pt has received both COVID vaccines.

## 2019-08-21 NOTE — ED Provider Notes (Addendum)
[place], [place]   Name: Sandy Eaton DOB: Mar 12, 1941 MRN: [id number] CSN: [id number] PCP: Titus Mould, NP  Arrival date and time:  08/20/19 1305  Chief Complaint:  Shortness of Breath   NOTE: Prior to seeing the patient today, I have reviewed the triage nursing documentation and vital signs. Clinical staff has updated patient's PMH/PSHx, current medication list, and drug allergies/intolerances to ensure comprehensive history available to assist in medical decision making.   History:   HPI: Sandy Eaton is a 79 y.o. female who presents today with complaints of increased shortness of breath, retrosternal chest pain, itchy eyes, a generalized headache and rhinorrhea for the last 3 weeks. Patient notes that pain in chest does move into her upper back based on positioning. Pain is not constant and is only associated with deep inspiration. Patient denies any nausea, vomiting, diaphoresis, or pain in her jaw/neck/shoulder/LEFT arm. PMH (+) for COPD, asthma,  and seasonal allergies. She has been prescribed several interventions (cetiritzine, diphenhydramine, and fluticasone) for her allergies, however patient has not been using any of these medications. She denies any associated fevers or chills. Patient denies being in close contact with anyone known to be ill. She has not been tested for SARS-CoV-2 (novel coronavirus) in the past 14 days; last tested negative on 06/24/2019 per my review of her EMR. She does not wish to be re-tested today. Patient has been vaccinated for influenza this season. Patient completed the 2 step SARS-CoV-2 vaccination [employer]) series on 08/12/2019. In efforts to manage her symptoms at home, the patient notes that she has used her prescribed ICS + LABA (Symbicort) and SABA (albuterol) MDI, in addition to nebulized albuterol treatments. She notes that these interventions have not significantly helped to improve her symptoms. Patient contacted her PCP's office who  advised her to go to an urgent care. She is requesting a note today excusing her from work.   Past Medical History:  Diagnosis Date  . Asthma   . COPD (chronic obstructive pulmonary disease) (HCC)    Suspected  . Environmental allergies   . GERD (gastroesophageal reflux disease)   . Hyperlipidemia   . Pneumonia    3 times    Past Surgical History:  Procedure Laterality Date  . APPENDECTOMY    . FEMORAL HERNIA REPAIR Right 01/15/2017   Procedure: HERNIA REPAIR FEMORAL;  Surgeon: Nadeen Landau, MD;  Location: [facility] ORS;  Service: General;  Laterality: Right;  . ROTATOR CUFF REPAIR Right   . TONSILLECTOMY    . VAGINAL HYSTERECTOMY     1960's    Family History  Problem Relation Age of Onset  . COPD Father   . Asthma Daughter   . Asthma Grandchild   . Cancer Daughter        breast  . Breast cancer Daughter   . Other Mother        MVA    Social History   Tobacco Use  . Smoking status: Former Smoker    Packs/day: 1.00    Years: 30.00    Pack years: 30.00    Types: Cigarettes    Quit date: 02/24/2009    Years since quitting: 10.4  . Smokeless tobacco: Never Used  Substance Use Topics  . Alcohol use: No  . Drug use: No    Patient Active Problem List   Diagnosis Date Noted  . Chronic asthmatic bronchitis (HCC) 01/02/2019  . Pneumonia 05/05/2015  . Hospital acquired PNA 11/26/2014  . DOE (dyspnea on exertion) 02/24/2014  .  Cough 02/24/2014    Home Medications:    Current Meds  Medication Sig  . acetaminophen (TYLENOL) 500 MG tablet Take 1,000 mg by mouth 2 (two) times daily as needed for mild pain or headache.  . albuterol (PROVENTIL) (2.5 MG/3ML) 0.083% nebulizer solution Take 3 mLs (2.5 mg total) by nebulization every 6 (six) hours as needed for wheezing or shortness of breath.  . Albuterol Sulfate (PROAIR RESPICLICK) 108 (90 Base) MCG/ACT AEPB Inhale 2 puffs into the lungs every 4 (four) hours as needed.  Marland Kitchen aspirin EC 81 MG tablet Take 81 mg by mouth  daily.  Marland Kitchen atorvastatin (LIPITOR) 40 MG tablet Take 40 mg by mouth at bedtime.   . budesonide-formoterol (SYMBICORT) 80-4.5 MCG/ACT inhaler Inhale 2 puffs into the lungs 2 (two) times daily.  . calcium-vitamin D (OSCAL WITH D) 500-200 MG-UNIT tablet Take 1 tablet by mouth.  . diphenhydrAMINE (BENADRYL) 25 MG tablet Take 25 mg by mouth at bedtime as needed.  . fluticasone (FLONASE) 50 MCG/ACT nasal spray Place 1 spray into both nostrils daily as needed for rhinitis.   Marland Kitchen gabapentin (NEURONTIN) 300 MG capsule Take 300 mg by mouth 3 (three) times daily.  Marland Kitchen lidocaine (LIDODERM) 5 % Place 1 patch onto the skin every 12 (twelve) hours. Remove & Discard patch within 12 hours or as directed by MD  . meloxicam (MOBIC) 15 MG tablet Take 15 mg by mouth daily.  . montelukast (SINGULAIR) 10 MG tablet Take 10 mg by mouth at bedtime.  Marland Kitchen omeprazole (PRILOSEC) 40 MG capsule Take 40 mg by mouth daily.   Marland Kitchen POTASSIUM CHLORIDE PO Take by mouth.  . Respiratory Therapy Supplies (FLUTTER) DEVI Use 10-15 times daily  . sertraline (ZOLOFT) 50 MG tablet Take 50 mg by mouth daily.  Marland Kitchen tetrahydrozoline-zinc (VISINE-AC) 0.05-0.25 % ophthalmic solution Place 1 drop into both eyes 3 (three) times daily as needed (irritated eyes).    Allergies:   Augmentin [amoxicillin-pot clavulanate], Escitalopram oxalate, and Valium [diazepam]  Review of Systems (ROS):  Review of systems NEGATIVE unless otherwise noted in narrative H&P section.   Vital Signs: Today's Vitals   08/20/19 1326 08/20/19 1327 08/20/19 1331 08/20/19 1427  BP:   108/67   Pulse:   70   Resp:   17   Temp:   97.7 F (36.5 C)   TempSrc:   Oral   SpO2:   98%   Weight:  140 lb (63.5 kg)    Height:  5\' 3"  (1.6 m)    PainSc: 7    7     Physical Exam: Physical Exam  Constitutional: She is oriented to person, place, and time and well-developed, well-nourished, and in no distress.  HENT:  Head: Normocephalic and atraumatic.  Right Ear: Tympanic membrane  normal.  Left Ear: Tympanic membrane normal.  Nose: Rhinorrhea present. No mucosal edema or sinus tenderness.  Mouth/Throat: Uvula is midline and mucous membranes are normal. Posterior oropharyngeal erythema (mild with (+) clear PND) present. No oropharyngeal exudate or posterior oropharyngeal edema.  Eyes: Pupils are equal, round, and reactive to light. Conjunctivae are normal.  (+) tearing  Neck: No tracheal deviation present.  Cardiovascular: Normal rate, regular rhythm, normal heart sounds and intact distal pulses.  Pulmonary/Chest: Effort normal. She has decreased breath sounds (bibasilar). She has wheezes (scattered expiratory). She has rhonchi (coarse throughout; clears somewhat with forced cough).      Moderate cough noted in clinic. No SOB or increased WOB. No distress. Able to speak in complete  sentences without difficulties. SPO2 98% on RA.  Abdominal: Soft. Normal appearance and bowel sounds are normal. She exhibits no distension. There is no abdominal tenderness.  Musculoskeletal:     Cervical back: Normal range of motion and neck supple.  Lymphadenopathy:    She has no cervical adenopathy.  Neurological: She is alert and oriented to person, place, and time. Gait normal.  Skin: Skin is warm and dry. No rash noted. She is not diaphoretic.  Psychiatric: Mood, memory, affect and judgment normal.  Nursing note and vitals reviewed.   Urgent Care Treatments / Results:   Orders Placed This Encounter  Procedures  . DG Chest 2 View  . ED EKG  . EKG 12-Lead    LABS: PLEASE NOTE: all labs that were ordered this encounter are listed, however only abnormal results are displayed. Labs Reviewed - No data to display  URGENT CARE ECG REPORT Date: 08/20/2019 Time ECG obtained: 1401 PM Rate: 71 bpm Rhythm: normal sinus rhythm Axis (leads I and aVF): normal Intervals: PR 138 ms. QTc 447 ms. ST segment and T wave changes: No evidence of ST segment elevation or  depression Comparison: Similar to previous tracing obtained on 11/06/2018   RADIOLOGY: DG Chest 2 View  Result Date: 08/20/2019 CLINICAL DATA:  Shortness of breath. Retrosternal chest pain. Upper back pain for 3 weeks. EXAM: CHEST - 2 VIEW COMPARISON:  Most recent radiograph 06/16/2019. Most recent chest CT 11/06/2018 FINDINGS: The cardiomediastinal contours are unchanged with stable aortic tortuosity. The lungs are clear. Pulmonary vasculature is normal. No consolidation, pleural effusion, or pneumothorax. No acute osseous abnormalities are seen. Degenerative change in the spine. IMPRESSION: No acute abnormality.  Stable aortic tortuosity. Electronically Signed   By: Narda Rutherford M.D.   On: 08/20/2019 14:02     PROCEDURES: Procedures  MEDICATIONS RECEIVED THIS VISIT: Medications - No data to display  PERTINENT CLINICAL COURSE NOTES/UPDATES:   Initial Impression / Assessment and Plan / Urgent Care Course:  Pertinent labs & imaging results that were available during my care of the patient were personally reviewed by me and considered in my medical decision making (see lab/imaging section of note for values and interpretations).  Sandy Eaton is a 79 y.o. female who presents to [facility] today with complaints of Shortness of Breath  Patient is well appearing overall in clinic today. She does not appear to be in any acute distress. Presenting symptoms (see HPI) and exam as documented above. Symptoms persistent x 3 weeks. Last tested for SARS-CoV-2 (novel coronavirus) in January; results negative. Patient refuses repeat testing today. She completed SARS-CoV-2 vaccination [employer]) series on 08/12/2019 and has been feeling poorly since. No fevers or chills. Will pursue workup as follows:   EKG shows NSR without ectopy or evidence of ST segment elevation or depression. Tracing similar to previous.    Radiographs of the chest performed revealed no acute cardiopulmonary process; no  evidence of peribronchial thickening, areas of consolidation, or focal infiltrates.   Reviewed results with patient. Given chronicity of her symptoms, with onset being 3 weeks ago, will defer lab studies today. Suspect multifactorial etiology of her presenting symptom constellation. I suspect that she is having a flare of her COPD and seasonal allergies. Patient has several interventions already in place. She was advised to restart her cetirizine; Rx refilled. Encouraged use of fluticasone daily for rhinorrhea and congestion. As far as her breathing, she is having a significant cough and pleuritic CP. Will treat with a 7 day  course of doxycycline and systemic steroid taper. Patient advised to continue MDI and SVN treatments as previously prescribed. Discussed supportive care measures at home during acute phase of illness. Patient to rest as much as possible. She was encouraged to ensure adequate hydration (water and ORS) to prevent dehydration and electrolyte derangements. Patient may use APAP and/or IBU on an as needed basis for pain/fever.   Current clinical condition warrants patient being out of work in order to recover from her current injury/illness. She was provided with the appropriate documentation to provide to her place of employment that will allow for her to RTW on 08/23/2019 with no restrictions.   Discussed follow up with primary care physician in 1 week for re-evaluation. I have reviewed the follow up and strict return precautions for any new or worsening symptoms. Patient is aware of symptoms that would be deemed urgent/emergent, and would thus require further evaluation either here or in the emergency department. At the time of discharge, she verbalized understanding and consent with the discharge plan as it was reviewed with her. All questions were fielded by provider and/or clinic staff prior to patient discharge.    Final Clinical Impressions / Urgent Care Diagnoses:   Final diagnoses:   COPD exacerbation ([place])  Seasonal allergies    New Prescriptions:  [place] Controlled Substance Registry consulted? Not Applicable  Meds ordered this encounter  Medications  . cetirizine (ZYRTEC) 10 MG tablet    Sig: Take 1 tablet (10 mg total) by mouth daily.    Dispense:  30 tablet    Refill:  0  . predniSONE (STERAPRED UNI-PAK 21 TAB) 10 MG (21) TBPK tablet    Sig: Take by mouth daily. 60 mg x 1 day, 50 mg x 1 day, 40 mg x 1 day, 30 mg x 1 day, 20 mg x 1 day, 10 mg x 1 day    Dispense:  21 tablet    Refill:  0  . doxycycline (VIBRA-TABS) 100 MG tablet    Sig: Take 1 tablet (100 mg total) by mouth 2 (two) times daily for 7 days.    Dispense:  14 tablet    Refill:  0    Recommended Follow up Care:  Patient encouraged to follow up with the following provider within the specified time frame, or sooner as dictated by the severity of her symptoms. As always, she was instructed that for any urgent/emergent care needs, she should seek care either here or in the emergency department for more immediate evaluation.  Follow-up Information    Ricardo Jericho, NP In 1 week.   Specialty: Family Medicine Why: General reassessment of symptoms if not improving Contact information: [place] [address] [phone]         NOTE: This note was prepared using Dragon dictation software along with smaller phrase technology. Despite my best ability to proofread, there is the potential that transcriptional errors may still occur from this process, and are completely unintentional.    Karen Kitchens, NP 08/22/19 0010

## 2019-12-25 ENCOUNTER — Encounter: Payer: Self-pay | Admitting: Emergency Medicine

## 2019-12-25 ENCOUNTER — Other Ambulatory Visit: Payer: Self-pay

## 2019-12-25 ENCOUNTER — Ambulatory Visit
Admission: EM | Admit: 2019-12-25 | Discharge: 2019-12-25 | Disposition: A | Discharge status: Home / Self Care | Payer: BC Managed Care – PPO | Attending: Family Medicine | Admitting: Family Medicine

## 2019-12-25 DIAGNOSIS — J0100 Acute maxillary sinusitis, unspecified: Secondary | ICD-10-CM

## 2019-12-25 DIAGNOSIS — J441 Chronic obstructive pulmonary disease with (acute) exacerbation: Secondary | ICD-10-CM

## 2019-12-25 MED ORDER — PREDNISONE 20 MG PO TABS: ORAL_TABLET | ORAL | 0 refills | Status: DC

## 2019-12-25 MED ORDER — DOXYCYCLINE HYCLATE 100 MG PO TABS: 100.0000 mg | ORAL_TABLET | Freq: Two times a day (BID) | ORAL | 0 refills | Status: DC

## 2019-12-25 NOTE — ED Provider Notes (Signed)
[facility]    CSN: [id number] Arrival date & time: 12/25/19  1524      History   Chief Complaint Chief Complaint  Patient presents with  . Sinus Problem  . Cough  . Headache    HPI Sandy Eaton is a 79 y.o. female.   79 yo female with a c/o cough, nasal congestion, sinus headaches for one month. States cough has become productive. States she has copd and has been using her inhalers. Denies any fevers, chills, shortness of breath.    Sinus Problem Associated symptoms include headaches.  Cough Associated symptoms: headaches   Headache Associated symptoms: cough     Past Medical History:  Diagnosis Date  . Asthma   . COPD (chronic obstructive pulmonary disease) (HCC)    Suspected  . Environmental allergies   . GERD (gastroesophageal reflux disease)   . Hyperlipidemia   . Pneumonia    3 times    Patient Active Problem List   Diagnosis Date Noted  . Chronic asthmatic bronchitis (HCC) 01/02/2019  . Pneumonia 05/05/2015  . Hospital acquired PNA 11/26/2014  . DOE (dyspnea on exertion) 02/24/2014  . Cough 02/24/2014    Past Surgical History:  Procedure Laterality Date  . APPENDECTOMY    . FEMORAL HERNIA REPAIR Right 01/15/2017   Procedure: HERNIA REPAIR FEMORAL;  Surgeon: Nadeen Landau, MD;  Location: [facility] ORS;  Service: General;  Laterality: Right;  . ROTATOR CUFF REPAIR Right   . TONSILLECTOMY    . VAGINAL HYSTERECTOMY     1960's    OB History    Gravida  3   Para      Term      Preterm      AB      Living  2     SAB      TAB      Ectopic      Multiple      Live Births               Home Medications    Prior to Admission medications   Medication Sig Start Date End Date Taking? Authorizing Provider  acetaminophen (TYLENOL) 500 MG tablet Take 1,000 mg by mouth 2 (two) times daily as needed for mild pain or headache.   Yes [provider]  Albuterol Sulfate (PROAIR RESPICLICK) 108 (90 Base) MCG/ACT  AEPB Inhale 2 puffs into the lungs every 4 (four) hours as needed. 06/11/18  Yes Salary, Evelena Asa, MD  aspirin EC 81 MG tablet Take 81 mg by mouth daily.   Yes [provider]  atorvastatin (LIPITOR) 40 MG tablet Take 40 mg by mouth at bedtime.    Yes [provider]  budesonide-formoterol (SYMBICORT) 80-4.5 MCG/ACT inhaler Inhale 2 puffs into the lungs 2 (two) times daily. 06/11/18  Yes Salary, Evelena Asa, MD  calcium-vitamin D (OSCAL WITH D) 500-200 MG-UNIT tablet Take 1 tablet by mouth.   Yes [provider]  gabapentin (NEURONTIN) 300 MG capsule Take 300 mg by mouth 3 (three) times daily. 05/12/19  Yes [provider]  montelukast (SINGULAIR) 10 MG tablet Take 10 mg by mouth at bedtime. 05/12/19  Yes [provider]  omeprazole (PRILOSEC) 40 MG capsule Take 40 mg by mouth daily.    Yes [provider]  POTASSIUM CHLORIDE PO Take by mouth.   Yes [provider]  sertraline (ZOLOFT) 50 MG tablet Take 50 mg by mouth daily. 07/23/19  Yes [provider]  albuterol (PROVENTIL) (2.5 MG/3ML) 0.083% nebulizer solution Take 3 mLs (2.5 mg total) by nebulization every 6 (six) hours as needed for wheezing or shortness of breath. 06/11/18   Salary, Evelena Asa, MD  cetirizine (ZYRTEC) 10 MG tablet Take 1 tablet (10 mg total) by mouth daily. 08/20/19   Verlee Monte, NP  diphenhydrAMINE (BENADRYL) 25 MG tablet Take 25 mg by mouth at bedtime as needed.    [provider]  doxycycline (VIBRA-TABS) 100 MG tablet Take 1 tablet (100 mg total) by mouth 2 (two) times daily. 12/25/19   Payton Mccallum, MD  fluticasone (FLONASE) 50 MCG/ACT nasal spray Place 1 spray into both nostrils daily as needed for rhinitis.     [provider]  meloxicam (MOBIC) 15 MG tablet Take 15 mg by mouth daily. 03/04/19   [provider]  predniSONE (DELTASONE) 20 MG tablet 3 tabs po qd x 2 days, then 2 tabs po qd x 3 days, then 1 tab po qd x 3 days,  then half a tab po qd x 2 days 12/25/19   Payton Mccallum, MD  Respiratory Therapy Supplies (FLUTTER) DEVI Use 10-15 times daily 12/24/17   Merwyn Katos, MD  tetrahydrozoline-zinc (VISINE-AC) 0.05-0.25 % ophthalmic solution Place 1 drop into both eyes 3 (three) times daily as needed (irritated eyes).    [provider]    Family History Family History  Problem Relation Age of Onset  . COPD Father   . Asthma Daughter   . Asthma Grandchild   . Cancer Daughter        breast  . Breast cancer Daughter   . Other Mother        MVA    Social History Social History   Tobacco Use  . Smoking status: Former Smoker    Packs/day: 1.00    Years: 30.00    Pack years: 30.00    Types: Cigarettes    Quit date: 02/24/2009    Years since quitting: 10.8  . Smokeless tobacco: Never Used  Vaping Use  . Vaping Use: Never used  Substance Use Topics  . Alcohol use: No  . Drug use: No     Allergies   Augmentin [amoxicillin-pot clavulanate], Escitalopram oxalate, and Valium [diazepam]   Review of Systems Review of Systems  Respiratory: Positive for cough.   Neurological: Positive for headaches.     Physical Exam Triage Vital Signs ED Triage Vitals  Enc Vitals Group     BP 12/25/19 1615 109/73     Pulse Rate 12/25/19 1615 81     Resp 12/25/19 1615 14     Temp 12/25/19 1615 98.2 F (36.8 C)     Temp Source 12/25/19 1615 Oral     SpO2 12/25/19 1615 98 %     Weight 12/25/19 1610 139 lb 15.9 oz (63.5 kg)     Height 12/25/19 1610 5' 3.5" (1.613 m)     Head Circumference --      Peak Flow --      Pain Score 12/25/19 1539 0     Pain Loc --      Pain Edu? --      Excl. in GC? --    No data found.  Updated Vital Signs BP 109/73 (BP Location: Left Arm)   Pulse 81   Temp 98.2 F (36.8 C) (Oral)   Resp 14   Ht 5' 3.5" (1.613 m)   Wt 63.5 kg   SpO2 98%   BMI 24.41  kg/m   Visual Acuity Right Eye Distance:   Left Eye Distance:   Bilateral Distance:    Right Eye Near:    Left Eye Near:    Bilateral Near:     Physical Exam Vitals and nursing note reviewed.  Constitutional:      General: She is not in acute distress.    Appearance: She is not toxic-appearing or diaphoretic.  HENT:     Right Ear: Tympanic membrane normal.     Left Ear: Tympanic membrane normal.     Nose: Congestion present.     Right Sinus: Maxillary sinus tenderness present.     Left Sinus: Maxillary sinus tenderness present.  Cardiovascular:     Rate and Rhythm: Normal rate.     Heart sounds: Normal heart sounds.  Pulmonary:     Effort: Pulmonary effort is normal. No respiratory distress.     Breath sounds: No stridor. Rhonchi present. No wheezing or rales.  Neurological:     Mental Status: She is alert.      UC Treatments / Results  Labs (all labs ordered are listed, but only abnormal results are displayed) Labs Reviewed - No data to display  EKG   Radiology No results found.  Procedures Procedures (including critical care time)  Medications Ordered in UC Medications - No data to display  Initial Impression / Assessment and Plan / UC Course  I have reviewed the triage vital signs and the nursing notes.  Pertinent labs & imaging results that were available during my care of the patient were reviewed by me and considered in my medical decision making (see chart for details).      Final Clinical Impressions(s) / UC Diagnoses   Final diagnoses:  COPD exacerbation (HCC)  Acute maxillary sinusitis, recurrence not specified    ED Prescriptions    Medication Sig Dispense Auth. Provider   doxycycline (VIBRA-TABS) 100 MG tablet Take 1 tablet (100 mg total) by mouth 2 (two) times daily. 20 tablet Payton Mccallum, MD   predniSONE (DELTASONE) 20 MG tablet 3 tabs po qd x 2 days, then 2 tabs po qd x 3 days, then 1 tab po qd x 3 days, then half a tab po qd x 2 days 16 tablet Payton Mccallum, MD      1. Labs/x-ray results and diagnosis reviewed with patient 2. rx as  per orders above; reviewed possible side effects, interactions, risks and benefits  3. Continue current inhalers 4. Follow-up prn if symptoms worsen or don't improve   PDMP not reviewed this encounter.   Payton Mccallum, MD 12/25/19 [phone]

## 2019-12-25 NOTE — ED Triage Notes (Signed)
Patient c/o sinus congestion, runny nose, HAs, bodyaches and cough for a month.  Patient denies fevers.

## 2019-12-31 ENCOUNTER — Other Ambulatory Visit: Payer: Self-pay | Admitting: Physician Assistant

## 2019-12-31 DIAGNOSIS — Z1231 Encounter for screening mammogram for malignant neoplasm of breast: Secondary | ICD-10-CM

## 2020-02-03 ENCOUNTER — Ambulatory Visit: Payer: BC Managed Care – PPO | Admitting: Pulmonary Disease

## 2020-02-19 ENCOUNTER — Other Ambulatory Visit: Payer: Self-pay

## 2020-02-19 ENCOUNTER — Ambulatory Visit
Admission: RE | Admit: 2020-02-19 | Discharge: 2020-02-19 | Disposition: A | Discharge status: Home / Self Care | Payer: BC Managed Care – PPO | Source: Ambulatory Visit | Attending: Physician Assistant | Admitting: Physician Assistant

## 2020-02-19 DIAGNOSIS — Z1231 Encounter for screening mammogram for malignant neoplasm of breast: Secondary | ICD-10-CM | POA: Diagnosis not present

## 2020-02-23 ENCOUNTER — Other Ambulatory Visit
Admission: RE | Admit: 2020-02-23 | Discharge: 2020-02-23 | Disposition: A | Discharge status: Home / Self Care | Payer: BC Managed Care – PPO | Attending: Primary Care | Admitting: Primary Care

## 2020-02-23 ENCOUNTER — Telehealth: Payer: Self-pay | Admitting: Primary Care

## 2020-02-23 ENCOUNTER — Ambulatory Visit (INDEPENDENT_AMBULATORY_CARE_PROVIDER_SITE_OTHER): Payer: BC Managed Care – PPO | Admitting: Primary Care

## 2020-02-23 ENCOUNTER — Other Ambulatory Visit: Payer: Self-pay

## 2020-02-23 ENCOUNTER — Encounter: Payer: Self-pay | Admitting: Primary Care

## 2020-02-23 VITALS — BP 122/68 | HR 95 | Temp 97.5°F | Ht 63.5 in | Wt 153.0 lb

## 2020-02-23 DIAGNOSIS — R0602 Shortness of breath: Secondary | ICD-10-CM | POA: Insufficient documentation

## 2020-02-23 DIAGNOSIS — J449 Chronic obstructive pulmonary disease, unspecified: Secondary | ICD-10-CM | POA: Diagnosis not present

## 2020-02-23 LAB — CBC WITH DIFFERENTIAL/PLATELET
Abs Immature Granulocytes: 0.05 10*3/uL (ref 0.00–0.07)
Basophils Absolute: 0.1 10*3/uL (ref 0.0–0.1)
Basophils Relative: 1 %
Eosinophils Absolute: 0.3 10*3/uL (ref 0.0–0.5)
Eosinophils Relative: 3 %
HCT: 39 % (ref 36.0–46.0)
Hemoglobin: 13.4 g/dL (ref 12.0–15.0)
Immature Granulocytes: 1 %
Lymphocytes Relative: 32 %
Lymphs Abs: 2.7 10*3/uL (ref 0.7–4.0)
MCH: 31.9 pg (ref 26.0–34.0)
MCHC: 34.4 g/dL (ref 30.0–36.0)
MCV: 92.9 fL (ref 80.0–100.0)
Monocytes Absolute: 0.8 10*3/uL (ref 0.1–1.0)
Monocytes Relative: 9 %
Neutro Abs: 4.4 10*3/uL (ref 1.7–7.7)
Neutrophils Relative %: 54 %
Platelets: 188 10*3/uL (ref 150–400)
RBC: 4.2 MIL/uL (ref 3.87–5.11)
RDW: 12.2 % (ref 11.5–15.5)
WBC: 8.3 10*3/uL (ref 4.0–10.5)
nRBC: 0 % (ref 0.0–0.2)

## 2020-02-23 LAB — FIBRIN DERIVATIVES D-DIMER (ARMC ONLY): Fibrin derivatives D-dimer (ARMC): 753.51 ng/mL (FEU) — ABNORMAL HIGH (ref 0.00–499.00)

## 2020-02-23 MED ORDER — AEROCHAMBER MV MISC
0 refills | Status: DC
Start: 2020-02-23 — End: 2020-02-23

## 2020-02-23 MED ORDER — MONTELUKAST SODIUM 10 MG PO TABS: 10.0000 mg | ORAL_TABLET | Freq: Every day | ORAL | 5 refills | Status: DC

## 2020-02-23 NOTE — Progress Notes (Signed)
@Patient  ID: , female    DOB: 1941-04-28, 79 y.o.   MRN: [id number]  Chief Complaint  Patient presents with  . Follow-up    breathing has worsen since last OV. c/o sob with exertion and occ wheezing and non prod cough.     Referring provider: [id number]*  HPI: 79 year old female, former smoker. PMH significant for chronic asthmatic bronchitis, pneumonia, dyspnea on exertion. Previous patient of Dr. 76, last seen on 01/02/19.   02/23/2020 Patient presents today for 1 year follow-up asthma. Accompanied by her daughter. Patient reports increased shortness of breath over the last month. She has been out of work since July. Daughter feels she gets winded easier over the last two years. Works as a [employer] at [employer]. She works 8 hours, states that lately just being there causes her to be short winded. Heat affects her breathing. She likes to work. PCP increased Symbicort 160. Most days she only uses Symbicort inhaler once a day. States that most of the time she just forgets to take her second does if she is busy that day. Inhaler cost her 90 dollars. She takes Singulair 10mg  as prescribed. She has right sided back pain, this appears chronic but is on the same side she has had previous pneumonia.    Allergies  Allergen Reactions  . Augmentin [Amoxicillin-Pot Clavulanate] Nausea And Vomiting    Has patient had a PCN reaction causing immediate rash, facial/tongue/throat swelling, SOB or lightheadedness with hypotension: No Has patient had a PCN reaction causing severe rash involving mucus membranes or skin necrosis: No Has patient had a PCN reaction that required hospitalization: No Has patient had a PCN reaction occurring within the last 10 years: No If all of the above answers are "NO", then may proceed with Cephalosporin use.   . Escitalopram Oxalate Diarrhea  . Valium [Diazepam] Other (See Comments)    Reaction:  Hallucinations     Immunization History   Administered Date(s) Administered  . Influenza Split 03/26/2013  . Influenza, High Dose Seasonal PF 02/15/2016, 03/07/2017, 02/17/2019, 02/17/2019  . Influenza, Seasonal, Injecte, Preservative Fre 03/18/2013  . Influenza,inj,Quad PF,6+ Mos 05/06/2015  . Influenza-Unspecified 03/31/2014, 05/06/2015, 02/16/2016  . PFIZER SARS-COV-2 Vaccination 07/22/2019, 08/12/2019  . Pneumococcal Conjugate-13 01/26/2015  . Pneumococcal Polysaccharide-23 02/22/2016, 01/04/2018  . Tdap 02/10/2013  . Zoster 03/18/2014  . Zoster Recombinat (Shingrix) 04/02/2019, 07/03/2019    Past Medical History:  Diagnosis Date  . Asthma   . COPD (chronic obstructive pulmonary disease) (HCC)    Suspected  . Environmental allergies   . GERD (gastroesophageal reflux disease)   . Hyperlipidemia   . Pneumonia    3 times    Tobacco History: Social History   Tobacco Use  Smoking Status Former Smoker  . Packs/day: 1.00  . Years: 30.00  . Pack years: 30.00  . Types: Cigarettes  . Quit date: 02/24/2009  . Years since quitting: 11.0  Smokeless Tobacco Never Used   Counseling given: Not Answered   Outpatient Medications Prior to Visit  Medication Sig Dispense Refill  . acetaminophen (TYLENOL) 500 MG tablet Take 1,000 mg by mouth 2 (two) times daily as needed for mild pain or headache.    . albuterol (PROVENTIL) (2.5 MG/3ML) 0.083% nebulizer solution Take 3 mLs (2.5 mg total) by nebulization every 6 (six) hours as needed for wheezing or shortness of breath. 75 mL 12  . Albuterol Sulfate (PROAIR RESPICLICK) 108 (90 Base) MCG/ACT AEPB Inhale 2 puffs into the lungs  every 4 (four) hours as needed. 1 each 3  . aspirin EC 81 MG tablet Take 81 mg by mouth daily.    . budesonide-formoterol (SYMBICORT) 80-4.5 MCG/ACT inhaler Inhale 2 puffs into the lungs 2 (two) times daily. 1 Inhaler 2  . calcium-vitamin D (OSCAL WITH D) 500-200 MG-UNIT tablet Take 1 tablet by mouth.    . cetirizine (ZYRTEC) 10 MG tablet Take 1 tablet (10  mg total) by mouth daily. 30 tablet 0  . diphenhydrAMINE (BENADRYL) 25 MG tablet Take 25 mg by mouth at bedtime as needed.    . fluticasone (FLONASE) 50 MCG/ACT nasal spray Place 1 spray into both nostrils daily as needed for rhinitis.     Marland Kitchen gabapentin (NEURONTIN) 300 MG capsule Take 300 mg by mouth 3 (three) times daily.    . meloxicam (MOBIC) 15 MG tablet Take 15 mg by mouth daily.    Marland Kitchen omeprazole (PRILOSEC) 40 MG capsule Take 40 mg by mouth daily.     Marland Kitchen POTASSIUM CHLORIDE PO Take by mouth.    . Respiratory Therapy Supplies (FLUTTER) DEVI Use 10-15 times daily 1 each 0  . sertraline (ZOLOFT) 50 MG tablet Take 50 mg by mouth daily.    Marland Kitchen tetrahydrozoline-zinc (VISINE-AC) 0.05-0.25 % ophthalmic solution Place 1 drop into both eyes 3 (three) times daily as needed (irritated eyes).    Marland Kitchen doxycycline (VIBRA-TABS) 100 MG tablet Take 1 tablet (100 mg total) by mouth 2 (two) times daily. 20 tablet 0  . montelukast (SINGULAIR) 10 MG tablet Take 10 mg by mouth at bedtime.    . predniSONE (DELTASONE) 20 MG tablet 3 tabs po qd x 2 days, then 2 tabs po qd x 3 days, then 1 tab po qd x 3 days, then half a tab po qd x 2 days 16 tablet 0  . atorvastatin (LIPITOR) 40 MG tablet Take 40 mg by mouth at bedtime.  (Patient not taking: Reported on 02/23/2020)     No facility-administered medications prior to visit.    Review of Systems  Review of Systems  Constitutional: Positive for fatigue.  Respiratory: Positive for chest tightness, shortness of breath and wheezing. Negative for cough.    Physical Exam  BP 122/68 (BP Location: Left Arm, Cuff Size: Normal)   Pulse 95   Temp (!) 97.5 F (36.4 C) (Temporal)   Ht 5' 3.5" (1.613 m)   Wt 153 lb (69.4 kg)   SpO2 95%   BMI 26.68 kg/m  Physical Exam Constitutional:      Appearance: Normal appearance.  HENT:     Head: Normocephalic and atraumatic.     Mouth/Throat:     Comments: Deferred d/t masking Cardiovascular:     Rate and Rhythm: Normal rate and  regular rhythm.  Pulmonary:     Effort: Pulmonary effort is normal.     Breath sounds: No wheezing, rhonchi or rales.     Comments: Decreased breath sounds Musculoskeletal:        General: Normal range of motion.  Skin:    General: Skin is warm and dry.  Neurological:     General: No focal deficit present.     Mental Status: She is alert and oriented to person, place, and time. Mental status is at baseline.  Psychiatric:        Mood and Affect: Mood normal.        Behavior: Behavior normal.        Thought Content: Thought content normal.  Judgment: Judgment normal.      Lab Results:  CBC    Component Value Date/Time   WBC 8.3 02/23/2020 1733   RBC 4.20 02/23/2020 1733   HGB 13.4 02/23/2020 1733   HGB 13.6 01/31/2014 1120   HCT 39.0 02/23/2020 1733   HCT 41.4 01/31/2014 1120   PLT 188 02/23/2020 1733   PLT 178 01/31/2014 1120   MCV 92.9 02/23/2020 1733   MCV 97 01/31/2014 1120   MCH 31.9 02/23/2020 1733   MCHC 34.4 02/23/2020 1733   RDW 12.2 02/23/2020 1733   RDW 12.3 01/31/2014 1120   LYMPHSABS 2.7 02/23/2020 1733   MONOABS 0.8 02/23/2020 1733   EOSABS 0.3 02/23/2020 1733   BASOSABS 0.1 02/23/2020 1733    BMET    Component Value Date/Time   NA 140 11/06/2018 1732   NA 140 01/31/2014 1120   K 4.0 11/06/2018 1732   K 3.6 01/31/2014 1120   CL 108 11/06/2018 1732   CL 105 01/31/2014 1120   CO2 24 11/06/2018 1732   CO2 23 01/31/2014 1120   GLUCOSE 167 (H) 11/06/2018 1732   GLUCOSE 149 (H) 01/31/2014 1120   BUN 17 11/06/2018 1732   BUN 13 01/31/2014 1120   CREATININE 0.61 11/06/2018 1732   CREATININE 0.88 01/31/2014 1120   CALCIUM 8.8 (L) 11/06/2018 1732   CALCIUM 8.1 (L) 01/31/2014 1120   GFRNONAA >60 11/06/2018 1732   GFRNONAA >60 01/31/2014 1120   GFRAA >60 11/06/2018 1732   GFRAA >60 01/31/2014 1120    BNP    Component Value Date/Time   BNP 9.0 05/05/2015 0817    ProBNP No results found for: PROBNP  Imaging: MM 3D SCREEN BREAST  BILATERAL  Result Date: 02/19/2020 CLINICAL DATA:  Screening. EXAM: DIGITAL SCREENING BILATERAL MAMMOGRAM WITH TOMO AND CAD COMPARISON:  Previous exam(s). ACR Breast Density Category b: There are scattered areas of fibroglandular density. FINDINGS: There are no findings suspicious for malignancy. Images were processed with CAD. IMPRESSION: No mammographic evidence of malignancy. A result letter of this screening mammogram will be mailed directly to the patient. RECOMMENDATION: Screening mammogram in one year. (Code:SM-B-01Y) BI-RADS CATEGORY  1: Negative. Electronically Signed   By: Beckie Salts M.D.   On: 02/19/2020 16:55     Assessment & Plan:   Chronic asthmatic bronchitis (HCC) - Former smoker. HX asthma. Last seen in July 2020. Patient reports increased shortness of breath over the last month with associated right sided back pain. Inconsistent use of ICS/LABA.  - Encouraged patient to use Symbicort 160 two puffs TWICE daily/ Adding Aero chamber with hfa inhaler - Recommend patient continue to use incentive spirometer THREE times a day - Continue Singulair 10mg  at bedtime  - D-dimer elevated, needs CTA to r/o PE  - Patient unlikely able to repeat PFTs  - FU in 1 month    , NP 02/24/2020

## 2020-02-23 NOTE — Patient Instructions (Addendum)
Asthma: - Use Symbicort two puffs TWICE daily (try setting alarm on phone to remind you) - Use Spacer (aerochamber) with Symbicort and Albuterol hand held inhaler  - Use incentive spirometer THREE times a day - Continue Singulair 10mg  at bedtime   Orders: - Labs today  Follow-up: - 4 weeks with Beth    Asthma, Adult  Asthma is a long-term (chronic) condition in which the airways get tight and narrow. The airways are the breathing passages that lead from the nose and mouth down into the lungs. A person with asthma will have times when symptoms get worse. These are called asthma attacks. They can cause coughing, whistling sounds when you breathe (wheezing), shortness of breath, and chest pain. They can make it hard to breathe. There is no cure for asthma, but medicines and lifestyle changes can help control it. There are many things that can bring on an asthma attack or make asthma symptoms worse (triggers). Common triggers include:  Mold.  Dust.  Cigarette smoke.  Cockroaches.  Things that can cause allergy symptoms (allergens). These include animal skin flakes (dander) and pollen from trees or grass.  Things that pollute the air. These may include household cleaners, wood smoke, smog, or chemical odors.  Cold air, weather changes, and wind.  Crying or laughing hard.  Stress.  Certain medicines or drugs.  Certain foods such as dried fruit, potato chips, and grape juice.  Infections, such as a cold or the flu.  Certain medical conditions or diseases.  Exercise or tiring activities. Asthma may be treated with medicines and by staying away from the things that cause asthma attacks. Types of medicines may include:  Controller medicines. These help prevent asthma symptoms. They are usually taken every day.  Fast-acting reliever or rescue medicines. These quickly relieve asthma symptoms. They are used as needed and provide short-term relief.  Allergy medicines if your  attacks are brought on by allergens.  Medicines to help control the body's defense (immune) system. Follow these instructions at home: Avoiding triggers in your home  Change your heating and air conditioning filter often.  Limit your use of fireplaces and wood stoves.  Get rid of pests (such as roaches and mice) and their droppings.  Throw away plants if you see mold on them.  Clean your floors. Dust regularly. Use cleaning products that do not smell.  Have someone vacuum when you are not home. Use a vacuum cleaner with a HEPA filter if possible.  Replace carpet with wood, tile, or vinyl flooring. Carpet can trap animal skin flakes and dust.  Use allergy-proof pillows, mattress covers, and box spring covers.  Wash bed sheets and blankets every week in hot water. Dry them in a dryer.  Keep your bedroom free of any triggers.  Avoid pets and keep windows closed when things that cause allergy symptoms are in the air.  Use blankets that are made of polyester or cotton.  Clean bathrooms and kitchens with bleach. If possible, have someone repaint the walls in these rooms with mold-resistant paint. Keep out of the rooms that are being cleaned and painted.  Wash your hands often with soap and water. If soap and water are not available, use hand sanitizer.  Do not allow anyone to smoke in your home. General instructions  Take over-the-counter and prescription medicines only as told by your doctor. ? Talk with your doctor if you have questions about how or when to take your medicines. ? Make note if you need to  use your medicines more often than usual.  Do not use any products that contain nicotine or tobacco, such as cigarettes and e-cigarettes. If you need help quitting, ask your doctor.  Stay away from secondhand smoke.  Avoid doing things outdoors when allergen counts are high and when air quality is low.  Wear a ski mask when doing outdoor activities in the winter. The mask  should cover your nose and mouth. Exercise indoors on cold days if you can.  Warm up before you exercise. Take time to cool down after exercise.  Use a peak flow meter as told by your doctor. A peak flow meter is a tool that measures how well the lungs are working.  Keep track of the peak flow meter's readings. Write them down.  Follow your asthma action plan. This is a written plan for taking care of your asthma and treating your attacks.  Make sure you get all the shots (vaccines) that your doctor recommends. Ask your doctor about a flu shot and a pneumonia shot.  Keep all follow-up visits as told by your doctor. This is important. Contact a doctor if:  You have wheezing, shortness of breath, or a cough even while taking medicine to prevent attacks.  The mucus you cough up (sputum) is thicker than usual.  The mucus you cough up changes from clear or white to yellow, green, gray, or bloody.  You have problems from the medicine you are taking, such as: ? A rash. ? Itching. ? Swelling. ? Trouble breathing.  You need reliever medicines more than 2-3 times a week.  Your peak flow reading is still at 50-79% of your personal best after following the action plan for 1 hour.  You have a fever. Get help right away if:  You seem to be worse and are not responding to medicine during an asthma attack.  You are short of breath even at rest.  You get short of breath when doing very little activity.  You have trouble eating, drinking, or talking.  You have chest pain or tightness.  You have a fast heartbeat.  Your lips or fingernails start to turn blue.  You are light-headed or dizzy, or you faint.  Your peak flow is less than 50% of your personal best.  You feel too tired to breathe normally. Summary  Asthma is a long-term (chronic) condition in which the airways get tight and narrow. An asthma attack can make it hard to breathe.  Asthma cannot be cured, but medicines and  lifestyle changes can help control it.  Make sure you understand how to avoid triggers and how and when to use your medicines. This information is not intended to replace advice given to you by your health care provider. Make sure you discuss any questions you have with your health care provider. Document Revised: 08/07/2018 Document Reviewed: 07/09/2016 Elsevier Patient Education  2020 [employer].

## 2020-02-24 ENCOUNTER — Other Ambulatory Visit: Payer: Self-pay | Admitting: *Deleted

## 2020-02-24 ENCOUNTER — Telehealth: Payer: Self-pay | Admitting: Primary Care

## 2020-02-24 DIAGNOSIS — R0602 Shortness of breath: Secondary | ICD-10-CM

## 2020-02-24 MED ORDER — FLUTICASONE-SALMETEROL 250-50 MCG/DOSE IN AEPB: 1.0000 | INHALATION_SPRAY | Freq: Two times a day (BID) | RESPIRATORY_TRACT | 5 refills | Status: DC

## 2020-02-24 NOTE — Assessment & Plan Note (Addendum)
-   Former smoker. HX asthma. Last seen in July 2020. Patient reports increased shortness of breath over the last month with associated right sided back pain. Inconsistent use of ICS/LABA.  - Encouraged patient to use Symbicort 160 two puffs TWICE daily/ Adding Aero chamber with hfa inhaler - Recommend patient continue to use incentive spirometer THREE times a day - Continue Singulair 10mg  at bedtime  - D-dimer elevated, needs CTA to r/o PE  - Patient unlikely able to repeat PFTs  - FU in 1 month

## 2020-02-24 NOTE — Telephone Encounter (Signed)
Attempted again to contact patient. Her line goes straight to busy signal.

## 2020-02-24 NOTE — Telephone Encounter (Signed)
Sandy Bayley, NP  02/24/2020 9:58 AM EDT     Please let patient know her D-dimer was elevated. Please place order for stat CTA r/o PE. Re: dyspnea.   Called pt x 3 and the line was busy each call

## 2020-02-24 NOTE — Telephone Encounter (Signed)
Im going to change her Symbicort to [employer] d/t cost. See above for price information.

## 2020-02-24 NOTE — Telephone Encounter (Signed)
Ran test claim for 1 month supply:  Cheapest is Wixela- $4.00 copay Advair Hfa- $92.62 Symbicort- $84.85- generic was not covered Breo- $85.08 Dulera-$71.10 Airduo- not covered  Patient's plan requires her to fill through [employer].

## 2020-02-24 NOTE — Progress Notes (Signed)
Please let patient know her D-dimer was elevated. Please place order for stat CTA r/o PE. Re: dyspnea.

## 2020-02-24 NOTE — Telephone Encounter (Signed)
Can you do benefit investigation for ICS/LABA, what's cheapest on her plan ?

## 2020-02-25 ENCOUNTER — Other Ambulatory Visit: Payer: Self-pay | Admitting: Primary Care

## 2020-02-25 DIAGNOSIS — R0602 Shortness of breath: Secondary | ICD-10-CM

## 2020-02-25 NOTE — Telephone Encounter (Signed)
Patient called the office let her know that her D-dimer was elevated and that we are going to order CTA. Patient expressed understanding. Order has been placed. Nothing further needed at this time.

## 2020-02-25 NOTE — Telephone Encounter (Signed)
Rx was sent to pharmacy by [facility]. Attempted to call pt but received busy tone. Tried to call back but still received busy tone. Will try to call back later.

## 2020-02-25 NOTE — Telephone Encounter (Signed)
Attempted again to contact patient re: abn lab. Patient's phone goes straight to busy signal.

## 2020-02-25 NOTE — Telephone Encounter (Signed)
Pt returning call.  [phone]

## 2020-02-25 NOTE — Telephone Encounter (Signed)
I sent it in I believe

## 2020-02-25 NOTE — Telephone Encounter (Signed)
Beth, can you please verify strength of Wixela you want [place] to send for her? Thanks!

## 2020-02-26 ENCOUNTER — Other Ambulatory Visit: Payer: BC Managed Care – PPO

## 2020-02-26 ENCOUNTER — Other Ambulatory Visit: Payer: Self-pay

## 2020-02-26 ENCOUNTER — Telehealth: Payer: Self-pay | Admitting: Primary Care

## 2020-02-26 ENCOUNTER — Ambulatory Visit
Admission: RE | Admit: 2020-02-26 | Discharge: 2020-02-26 | Disposition: A | Discharge status: Home / Self Care | Payer: BC Managed Care – PPO | Source: Ambulatory Visit | Attending: Primary Care | Admitting: Primary Care

## 2020-02-26 DIAGNOSIS — R0602 Shortness of breath: Secondary | ICD-10-CM

## 2020-02-26 LAB — IGE: IgE (Immunoglobulin E), Serum: 11 IU/mL (ref 6–495)

## 2020-02-26 MED ORDER — IOPAMIDOL (ISOVUE-370) INJECTION 76%
75.0000 mL | Freq: Once | INTRAVENOUS | Status: AC | PRN
  Administered 2020-02-26: 75 mL via INTRAVENOUS

## 2020-02-26 NOTE — Telephone Encounter (Signed)
ATC patient LMTCB X2 rang busy

## 2020-02-26 NOTE — Progress Notes (Signed)
Attempted to call patient myself, line busy. please let patient know CT looked ok. It showed no evidence of pulmonary embolism or pneumonia. Chronic scarring. Small pulmonary nodules 66mm, stable. Coronary artery calcification/ aortic atherosclerosis. No adenopathy.

## 2020-02-26 NOTE — Telephone Encounter (Signed)
Glenford Bayley, NP  02/26/2020 9:44 AM EDT     Attempted to call patient myself, line busy. please let patient know CT looked ok. It showed no evidence of pulmonary embolism or pneumonia. Chronic scarring. Small pulmonary nodules 47mm, stable. Coronary artery calcification/ aortic atherosclerosis. No adenopathy.    Spoke with pt and notified of results per [facility]. Pt verbalized understanding and denied any questions.

## 2020-02-26 NOTE — Telephone Encounter (Signed)
ATC, received a busy tone.

## 2020-02-26 NOTE — Progress Notes (Signed)
I spoke with the pt and made her aware of results and she verbalized understanding.

## 2020-07-14 ENCOUNTER — Other Ambulatory Visit: Payer: Self-pay

## 2020-07-14 ENCOUNTER — Other Ambulatory Visit: Payer: BC Managed Care – PPO

## 2020-07-14 DIAGNOSIS — Z20822 Contact with and (suspected) exposure to covid-19: Secondary | ICD-10-CM

## 2020-07-16 ENCOUNTER — Telehealth: Payer: Self-pay

## 2020-07-16 NOTE — Telephone Encounter (Signed)
Pt's daughter called for results- advised results are not back.Pt has MyChart,

## 2020-07-17 LAB — NOVEL CORONAVIRUS, NAA: SARS-CoV-2, NAA: NOT DETECTED

## 2020-08-17 ENCOUNTER — Other Ambulatory Visit: Admission: RE | Admit: 2020-08-17 | Payer: BC Managed Care – PPO | Source: Ambulatory Visit

## 2020-08-19 ENCOUNTER — Ambulatory Visit: Payer: BC Managed Care – PPO | Admitting: Certified Registered"

## 2020-08-19 ENCOUNTER — Encounter
Admission: RE | Disposition: A | Discharge status: Home / Self Care | Payer: Self-pay | Source: Home / Self Care | Attending: Gastroenterology

## 2020-08-19 ENCOUNTER — Ambulatory Visit
Admission: RE | Admit: 2020-08-19 | Discharge: 2020-08-19 | Disposition: A | Discharge status: Home / Self Care | Payer: BC Managed Care – PPO | Attending: Gastroenterology | Admitting: Gastroenterology

## 2020-08-19 DIAGNOSIS — Z881 Allergy status to other antibiotic agents status: Secondary | ICD-10-CM | POA: Insufficient documentation

## 2020-08-19 DIAGNOSIS — K319 Disease of stomach and duodenum, unspecified: Secondary | ICD-10-CM | POA: Diagnosis not present

## 2020-08-19 DIAGNOSIS — Z791 Long term (current) use of non-steroidal anti-inflammatories (NSAID): Secondary | ICD-10-CM | POA: Insufficient documentation

## 2020-08-19 DIAGNOSIS — Z87891 Personal history of nicotine dependence: Secondary | ICD-10-CM | POA: Insufficient documentation

## 2020-08-19 DIAGNOSIS — R634 Abnormal weight loss: Secondary | ICD-10-CM | POA: Diagnosis not present

## 2020-08-19 DIAGNOSIS — R194 Change in bowel habit: Secondary | ICD-10-CM | POA: Insufficient documentation

## 2020-08-19 DIAGNOSIS — K229 Disease of esophagus, unspecified: Secondary | ICD-10-CM | POA: Diagnosis not present

## 2020-08-19 DIAGNOSIS — Z7982 Long term (current) use of aspirin: Secondary | ICD-10-CM | POA: Diagnosis not present

## 2020-08-19 DIAGNOSIS — J449 Chronic obstructive pulmonary disease, unspecified: Secondary | ICD-10-CM | POA: Diagnosis not present

## 2020-08-19 DIAGNOSIS — K449 Diaphragmatic hernia without obstruction or gangrene: Secondary | ICD-10-CM | POA: Insufficient documentation

## 2020-08-19 DIAGNOSIS — Z880 Allergy status to penicillin: Secondary | ICD-10-CM | POA: Insufficient documentation

## 2020-08-19 DIAGNOSIS — K2970 Gastritis, unspecified, without bleeding: Secondary | ICD-10-CM | POA: Insufficient documentation

## 2020-08-19 DIAGNOSIS — Z79899 Other long term (current) drug therapy: Secondary | ICD-10-CM | POA: Diagnosis not present

## 2020-08-19 DIAGNOSIS — Z6825 Body mass index (BMI) 25.0-25.9, adult: Secondary | ICD-10-CM | POA: Diagnosis not present

## 2020-08-19 DIAGNOSIS — Z7951 Long term (current) use of inhaled steroids: Secondary | ICD-10-CM | POA: Insufficient documentation

## 2020-08-19 DIAGNOSIS — R1310 Dysphagia, unspecified: Secondary | ICD-10-CM | POA: Diagnosis present

## 2020-08-19 DIAGNOSIS — R197 Diarrhea, unspecified: Secondary | ICD-10-CM | POA: Diagnosis not present

## 2020-08-19 HISTORY — DX: Dyspnea, unspecified: R06.00

## 2020-08-19 HISTORY — DX: Headache, unspecified: R51.9

## 2020-08-19 HISTORY — PX: COLONOSCOPY WITH PROPOFOL: SHX5780

## 2020-08-19 HISTORY — PX: ESOPHAGOGASTRODUODENOSCOPY (EGD) WITH PROPOFOL: SHX5813

## 2020-08-19 HISTORY — DX: Other specified disorders of bone density and structure, unspecified site: M85.80

## 2020-08-19 HISTORY — DX: Chronic obstructive pulmonary disease, unspecified: J44.9

## 2020-08-19 HISTORY — DX: Other specified chronic obstructive pulmonary disease: J44.89

## 2020-08-19 LAB — KOH PREP

## 2020-08-19 SURGERY — COLONOSCOPY WITH PROPOFOL
Anesthesia: General

## 2020-08-19 MED ORDER — PROPOFOL 10 MG/ML IV BOLUS
INTRAVENOUS | Status: DC | PRN
  Administered 2020-08-19 (×2): 50 mg via INTRAVENOUS

## 2020-08-19 MED ORDER — GLYCOPYRROLATE 0.2 MG/ML IJ SOLN
INTRAMUSCULAR | Status: DC | PRN
  Administered 2020-08-19: .2 mg via INTRAVENOUS

## 2020-08-19 MED ORDER — SODIUM CHLORIDE 0.9 % IV SOLN
INTRAVENOUS | Status: DC
  Administered 2020-08-19: 20 mL/h via INTRAVENOUS

## 2020-08-19 MED ORDER — LIDOCAINE 2% (20 MG/ML) 5 ML SYRINGE
INTRAMUSCULAR | Status: DC | PRN
  Administered 2020-08-19: 25 mg via INTRAVENOUS

## 2020-08-19 MED ORDER — PROPOFOL 500 MG/50ML IV EMUL
INTRAVENOUS | Status: DC | PRN
  Administered 2020-08-19: 120 ug/kg/min via INTRAVENOUS

## 2020-08-19 NOTE — Op Note (Signed)
[facility] Gastroenterology Patient Name: Sandy Eaton Procedure Date: 08/19/2020 8:42 AM MRN: [id number] Account #: [id number] Date of Birth: [birth year] Admit Type: Outpatient Age: 80 Room: [facility] ENDO ROOM 3 Gender: Female Note Status: Finalized Procedure:             Colonoscopy Indications:           Change in bowel habits, Diarrhea, Weight loss Providers:             Andrey Farmer MD, MD Medicines:             Monitored Anesthesia Care Complications:         No immediate complications. Estimated blood loss:                         Minimal. Procedure:             Pre-Anesthesia Assessment:                        - Prior to the procedure, a History and Physical was                         performed, and patient medications and allergies were                         reviewed. The patient is competent. The risks and                         benefits of the procedure and the sedation options and                         risks were discussed with the patient. All questions                         were answered and informed consent was obtained.                         Patient identification and proposed procedure were                         verified by the physician, the nurse, the anesthetist                         and the technician in the endoscopy suite. Mental                         Status Examination: alert and oriented. Airway                         Examination: normal oropharyngeal airway and neck                         mobility. Respiratory Examination: clear to                         auscultation. CV Examination: normal. Prophylactic                         Antibiotics: The patient does not require prophylactic  antibiotics. Prior Anticoagulants: The patient has                         taken no previous anticoagulant or antiplatelet agents                         except for aspirin. ASA Grade Assessment: III - A                          patient with severe systemic disease. After reviewing                         the risks and benefits, the patient was deemed in                         satisfactory condition to undergo the procedure. The                         anesthesia plan was to use monitored anesthesia care                         (MAC). Immediately prior to administration of                         medications, the patient was re-assessed for adequacy                         to receive sedatives. The heart rate, respiratory                         rate, oxygen saturations, blood pressure, adequacy of                         pulmonary ventilation, and response to care were                         monitored throughout the procedure. The physical                         status of the patient was re-assessed after the                         procedure.                        After obtaining informed consent, the colonoscope was                         passed under direct vision. Throughout the procedure,                         the patient's blood pressure, pulse, and oxygen                         saturations were monitored continuously. The                         Colonoscope was introduced through the anus and  advanced to the the terminal ileum. The colonoscopy                         was performed without difficulty. The patient                         tolerated the procedure well. The quality of the bowel                         preparation was good. Findings:      The perianal and digital rectal examinations were normal.      The terminal ileum appeared normal.      The colon (entire examined portion) appeared normal. Biopsies for       histology were taken with a cold forceps from the entire colon for       evaluation of microscopic colitis. Estimated blood loss was minimal.      Retroflexion in the rectum was not performed due to small rectal vault.       Anal verge closely  examined on withdrawal and no abnormalities noted. Impression:            - The examined portion of the ileum was normal.                        - The entire examined colon is normal. Biopsied. Recommendation:        - Discharge patient to home.                        - Resume previous diet.                        - Continue present medications.                        - Await pathology results.                        - Repeat colonoscopy is not recommended due to current                         age (92 years or older) for screening purposes.                        - Return to referring physician as previously                         scheduled. Procedure Code(s):     --- Professional ---                        [phone], Colonoscopy, flexible; with biopsy, single or                         multiple Diagnosis Code(s):     --- Professional ---                        R19.4, Change in bowel habit                        R19.7, Diarrhea, unspecified  R63.4, Abnormal weight loss CPT copyright 2019 [employer] Association. All rights reserved. The codes documented in this report are preliminary and upon coder review may  be revised to meet current compliance requirements. Andrey Farmer MD, MD 08/19/2020 9:30:05 AM Number of Addenda: 0 Note Initiated On: 08/19/2020 8:42 AM Scope Withdrawal Time: 0 hours 8 minutes 43 seconds  Total Procedure Duration: 0 hours 16 minutes 27 seconds  Estimated Blood Loss:  Estimated blood loss was minimal.      [facility]

## 2020-08-19 NOTE — Transfer of Care (Signed)
Immediate Anesthesia Transfer of Care Note  Patient: Sandy Eaton  Procedure(s) Performed: COLONOSCOPY WITH PROPOFOL (N/A ) ESOPHAGOGASTRODUODENOSCOPY (EGD) WITH PROPOFOL (N/A )  Patient Location: Endoscopy Unit  Anesthesia Type:General  Level of Consciousness: drowsy  Airway & Oxygen Therapy: Patient Spontanous Breathing  Post-op Assessment: Report given to RN and Post -op Vital signs reviewed and stable  Post vital signs: Reviewed  Last Vitals:  Vitals Value Taken Time  BP 91/76 08/19/20 0923  Temp    Pulse 100 08/19/20 0922  Resp 19 08/19/20 0922  SpO2 91 % 08/19/20 0922  Vitals shown include unvalidated device data.  Last Pain:  Vitals:   08/19/20 0812  TempSrc: Temporal  PainSc: 8          Complications: No complications documented.

## 2020-08-19 NOTE — Anesthesia Preprocedure Evaluation (Signed)
Anesthesia Evaluation  Patient identified by MRN, date of birth, ID band Patient awake    Reviewed: Allergy & Precautions, NPO status , Patient's Chart, lab work & pertinent test results  History of Anesthesia Complications Negative for: history of anesthetic complications  Airway Mallampati: II  TM Distance: >3 FB Neck ROM: Full    Dental  (+) Edentulous Upper, Edentulous Lower   Pulmonary asthma , COPD,  COPD inhaler, former smoker,    breath sounds clear to auscultation- rhonchi (-) wheezing      Cardiovascular Exercise Tolerance: Good (-) hypertension(-) CAD, (-) Past MI, (-) Cardiac Stents and (-) CABG  Rhythm:Regular Rate:Normal - Systolic murmurs and - Diastolic murmurs    Neuro/Psych  Headaches, neg Seizures negative psych ROS   GI/Hepatic Neg liver ROS, GERD  ,  Endo/Other  negative endocrine ROSneg diabetes  Renal/GU negative Renal ROS     Musculoskeletal negative musculoskeletal ROS (+)   Abdominal (+) - obese,   Peds  Hematology negative hematology ROS (+)   Anesthesia Other Findings Past Medical History: No date: Asthma No date: Chronic asthmatic bronchitis (HCC) No date: COPD (chronic obstructive pulmonary disease) (HCC)     Comment:  Suspected No date: Dyspnea     Comment:  on exertion No date: Environmental allergies No date: GERD (gastroesophageal reflux disease) No date: Headache     Comment:  migraine without aura, not intractable No date: Hyperlipidemia No date: Osteopenia     Comment:  of lumbar spine No date: Pneumonia     Comment:  3 times   Reproductive/Obstetrics                             Anesthesia Physical Anesthesia Plan  ASA: II  Anesthesia Plan: General   Post-op Pain Management:    Induction: Intravenous  PONV Risk Score and Plan: 2 and Propofol infusion  Airway Management Planned: Natural Airway  Additional Equipment:   Intra-op  Plan:   Post-operative Plan:   Informed Consent: I have reviewed the patients History and Physical, chart, labs and discussed the procedure including the risks, benefits and alternatives for the proposed anesthesia with the patient or authorized representative who has indicated his/her understanding and acceptance.     Dental advisory given  Plan Discussed with: CRNA and Anesthesiologist  Anesthesia Plan Comments:         Anesthesia Quick Evaluation

## 2020-08-19 NOTE — H&P (Signed)
Outpatient short stay form Pre-procedure 08/19/2020 8:43 AM Sandy Lot MD, MPH  Primary Physician: [facility]  Reason for visit:  Dysphagia/Abdominal pain/Diarrhea  History of present illness:   80 y/o lady with a large amount of GI symptoms here for EGD/Colonoscopy. Has history of COPD which is well controlled. No family history of GI malignancies. History of hysterectomy, inguinal hernia repair, and appendectomy. No blood thinners.    Current Facility-Administered Medications:  .  0.9 %  sodium chloride infusion, , Intravenous, Continuous, Horrace Hanak, Rossie Muskrat, MD, Last Rate: 20 mL/hr at 08/19/20 0826, 20 mL/hr at 08/19/20 0826  Medications Prior to Admission  Medication Sig Dispense Refill Last Dose  . acetaminophen (TYLENOL) 500 MG tablet Take 1,000 mg by mouth 2 (two) times daily as needed for mild pain or headache.   08/18/2020 at Unknown time  . albuterol (PROVENTIL) (2.5 MG/3ML) 0.083% nebulizer solution Take 3 mLs (2.5 mg total) by nebulization every 6 (six) hours as needed for wheezing or shortness of breath. 75 mL 12 08/18/2020 at Unknown time  . Albuterol Sulfate (PROAIR RESPICLICK) 108 (90 Base) MCG/ACT AEPB Inhale 2 puffs into the lungs every 4 (four) hours as needed. 1 each 3 08/18/2020 at Unknown time  . aspirin EC 81 MG tablet Take 81 mg by mouth daily.   08/18/2020 at Unknown time  . budesonide-formoterol (SYMBICORT) 160-4.5 MCG/ACT inhaler Inhale 2 puffs into the lungs 2 (two) times daily.   08/18/2020 at Unknown time  . calcium-vitamin D (OSCAL WITH D) 500-200 MG-UNIT tablet Take 1 tablet by mouth.   08/18/2020 at Unknown time  . cetirizine (ZYRTEC) 10 MG tablet Take 1 tablet (10 mg total) by mouth daily. 30 tablet 0 08/18/2020 at Unknown time  . diphenhydrAMINE (BENADRYL) 25 MG tablet Take 25 mg by mouth at bedtime as needed.   08/18/2020 at Unknown time  . fluticasone (FLONASE) 50 MCG/ACT nasal spray Place 1 spray into both nostrils daily as needed for rhinitis.     08/18/2020 at Unknown time  . Fluticasone-Salmeterol (WIXELA INHUB) 250-50 MCG/DOSE AEPB Inhale 1 puff into the lungs 2 (two) times daily. 60 each 5 08/18/2020 at Unknown time  . gabapentin (NEURONTIN) 300 MG capsule Take 300 mg by mouth 3 (three) times daily.   08/18/2020 at Unknown time  . meloxicam (MOBIC) 15 MG tablet Take 15 mg by mouth daily.   08/18/2020 at Unknown time  . montelukast (SINGULAIR) 10 MG tablet Take 1 tablet (10 mg total) by mouth at bedtime. 30 tablet 5 08/18/2020 at Unknown time  . omeprazole (PRILOSEC) 40 MG capsule Take 40 mg by mouth daily.    08/18/2020 at Unknown time  . POTASSIUM CHLORIDE PO Take by mouth.   08/18/2020 at Unknown time  . Respiratory Therapy Supplies (FLUTTER) DEVI Use 10-15 times daily 1 each 0 08/18/2020 at Unknown time  . sertraline (ZOLOFT) 50 MG tablet Take 50 mg by mouth daily.   08/18/2020 at Unknown time  . tetrahydrozoline-zinc (VISINE-AC) 0.05-0.25 % ophthalmic solution Place 1 drop into both eyes 3 (three) times daily as needed (irritated eyes).   08/18/2020 at Unknown time  . atorvastatin (LIPITOR) 40 MG tablet Take 40 mg by mouth at bedtime.  (Patient not taking: Reported on 02/23/2020)        Allergies  Allergen Reactions  . Augmentin [Amoxicillin-Pot Clavulanate] Nausea And Vomiting    Has patient had a PCN reaction causing immediate rash, facial/tongue/throat swelling, SOB or lightheadedness with hypotension: No Has patient had a  PCN reaction causing severe rash involving mucus membranes or skin necrosis: No Has patient had a PCN reaction that required hospitalization: No Has patient had a PCN reaction occurring within the last 10 years: No If all of the above answers are "NO", then may proceed with Cephalosporin use.   . Escitalopram Oxalate Diarrhea  . Valium [Diazepam] Other (See Comments)    Reaction:  Hallucinations      Past Medical History:  Diagnosis Date  . Asthma   . Chronic asthmatic bronchitis (HCC)   . COPD (chronic obstructive  pulmonary disease) (HCC)    Suspected  . Dyspnea    on exertion  . Environmental allergies   . GERD (gastroesophageal reflux disease)   . Headache    migraine without aura, not intractable  . Hyperlipidemia   . Osteopenia    of lumbar spine  . Pneumonia    3 times    Review of systems:  Otherwise negative.    Physical Exam  Gen: Alert, oriented. Appears stated age.  HEENT: PERRLA. Lungs: No respiratory distress CV: RRR Abd: soft, benign, no masses Ext: No edema.    Planned procedures: Proceed with EGD/colonoscopy. The patient understands the nature of the planned procedure, indications, risks, alternatives and potential complications including but not limited to bleeding, infection, perforation, damage to internal organs and possible oversedation/side effects from anesthesia. The patient agrees and gives consent to proceed.  Please refer to procedure notes for findings, recommendations and patient disposition/instructions.     Sandy Lot MD, MPH Gastroenterology 08/19/2020  8:43 AM

## 2020-08-19 NOTE — Anesthesia Postprocedure Evaluation (Signed)
Anesthesia Post Note  Patient: Sandy Eaton  Procedure(s) Performed: COLONOSCOPY WITH PROPOFOL (N/A ) ESOPHAGOGASTRODUODENOSCOPY (EGD) WITH PROPOFOL (N/A )  Patient location during evaluation: Endoscopy Anesthesia Type: General Level of consciousness: awake and alert and oriented Pain management: pain level controlled Vital Signs Assessment: post-procedure vital signs reviewed and stable Respiratory status: spontaneous breathing, nonlabored ventilation and respiratory function stable Cardiovascular status: blood pressure returned to baseline and stable Postop Assessment: no signs of nausea or vomiting Anesthetic complications: no   No complications documented.   Last Vitals:  Vitals:   08/19/20 0941 08/19/20 0951  BP: 121/85 124/86  Pulse:    Resp:    Temp:    SpO2:      Last Pain:  Vitals:   08/19/20 0951  TempSrc:   PainSc: 0-No pain                 Amy Penwarden

## 2020-08-19 NOTE — Interval H&P Note (Signed)
History and Physical Interval Note:  08/19/2020 8:49 AM  Sandy Eaton  has presented today for surgery, with the diagnosis of ABDOMINAL PAIN GEN DIARRHEA BH CHANGE.  The various methods of treatment have been discussed with the patient and family. After consideration of risks, benefits and other options for treatment, the patient has consented to  Procedure(s) with comments: COLONOSCOPY WITH PROPOFOL (N/A) - [facility] COVID POSITIVE TEST - ATTACHED TO CHART ESOPHAGOGASTRODUODENOSCOPY (EGD) WITH PROPOFOL (N/A) as a surgical intervention.  The patient's history has been reviewed, patient examined, no change in status, stable for surgery.  I have reviewed the patient's chart and labs.  Questions were answered to the patient's satisfaction.     Regis Bill  Ok to proceed with EGD/Colonoscopy

## 2020-08-19 NOTE — Op Note (Signed)
[facility] Gastroenterology Patient Name: Sandy Eaton Procedure Date: 08/19/2020 8:42 AM MRN: [id number] Account #: [id number] Date of Birth: [birth year] Admit Type: Outpatient Age: 80 Room: [facility] ENDO ROOM 3 Gender: Female Note Status: Finalized Procedure:             Upper GI endoscopy Indications:           Generalized abdominal pain, Dysphagia, Diarrhea Providers:             Andrey Farmer MD, MD Medicines:             Monitored Anesthesia Care Complications:         No immediate complications. Estimated blood loss:                         Minimal. Procedure:             Pre-Anesthesia Assessment:                        - Prior to the procedure, a History and Physical was                         performed, and patient medications and allergies were                         reviewed. The patient is competent. The risks and                         benefits of the procedure and the sedation options and                         risks were discussed with the patient. All questions                         were answered and informed consent was obtained.                         Patient identification and proposed procedure were                         verified by the physician, the nurse, the anesthetist                         and the technician in the endoscopy suite. Mental                         Status Examination: alert and oriented. Airway                         Examination: normal oropharyngeal airway and neck                         mobility. Respiratory Examination: clear to                         auscultation. CV Examination: normal. Prophylactic                         Antibiotics: The patient does not require prophylactic  antibiotics. Prior Anticoagulants: The patient has                         taken no previous anticoagulant or antiplatelet agents                         except for aspirin. ASA Grade Assessment: III - A                          patient with severe systemic disease. After reviewing                         the risks and benefits, the patient was deemed in                         satisfactory condition to undergo the procedure. The                         anesthesia plan was to use monitored anesthesia care                         (MAC). Immediately prior to administration of                         medications, the patient was re-assessed for adequacy                         to receive sedatives. The heart rate, respiratory                         rate, oxygen saturations, blood pressure, adequacy of                         pulmonary ventilation, and response to care were                         monitored throughout the procedure. The physical                         status of the patient was re-assessed after the                         procedure.                        After obtaining informed consent, the endoscope was                         passed under direct vision. Throughout the procedure,                         the patient's blood pressure, pulse, and oxygen                         saturations were monitored continuously. The Endoscope                         was introduced through the mouth, and advanced to the  second part of duodenum. The upper GI endoscopy was                         accomplished without difficulty. The patient tolerated                         the procedure well. Findings:      Diffuse, white plaques were found in the entire esophagus. Brushings for       KOH prep were obtained in the lower third of the esophagus.      A small hiatal hernia was present.      Scattered mild inflammation characterized by erythema was found in the       gastric antrum. Biopsies were taken with a cold forceps for Helicobacter       pylori testing. Estimated blood loss was minimal.      The examined duodenum was normal. Impression:            - Esophageal plaques  were found, consistent with                         candidiasis. Brushings performed.                        - Small hiatal hernia.                        - Gastritis. Biopsied.                        - Normal examined duodenum. Recommendation:        - Discharge patient to home.                        - Resume previous diet.                        - Continue present medications.                        - Await pathology results.                        - Perform a colonoscopy today. Procedure Code(s):     --- Professional ---                        [phone], Esophagogastroduodenoscopy, flexible,                         transoral; with biopsy, single or multiple Diagnosis Code(s):     --- Professional ---                        K22.9, Disease of esophagus, unspecified                        K44.9, Diaphragmatic hernia without obstruction or                         gangrene                        K29.70, Gastritis, unspecified, without bleeding  R10.84, Generalized abdominal pain                        R13.10, Dysphagia, unspecified                        R19.7, Diarrhea, unspecified CPT copyright 2019 American Medical Association. All rights reserved. The codes documented in this report are preliminary and upon coder review may  be revised to meet current compliance requirements. Andrey Farmer MD, MD 08/19/2020 9:25:12 AM Number of Addenda: 0 Note Initiated On: 08/19/2020 8:42 AM Estimated Blood Loss:  Estimated blood loss was minimal.      [facility]

## 2020-08-22 ENCOUNTER — Encounter: Payer: Self-pay | Admitting: Gastroenterology

## 2020-08-22 LAB — SURGICAL PATHOLOGY

## 2020-08-23 ENCOUNTER — Telehealth: Payer: Self-pay | Admitting: Primary Care

## 2020-08-23 MED ORDER — FLUCONAZOLE 200 MG PO TABS: ORAL_TABLET | ORAL | 0 refills | Status: DC

## 2020-08-23 NOTE — Telephone Encounter (Signed)
Sent prescription in for diflucan (antifungal) x 14 days.

## 2020-08-23 NOTE — Telephone Encounter (Addendum)
Spoke to patient, who stated that she recently had a endoscopy. 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 GI feels that this is related to Advair. Patient reports of sore mouth/throat and white patches on tongue.  She rinses her mouth out after each use of Advair. Sx have been present for 46mo.   Beth, please advise. Thanks

## 2020-08-23 NOTE — Telephone Encounter (Signed)
Patient is aware of below message/recommedations.  She voiced her understanding and had no further questions.  Nothing further needed at this time.

## 2020-09-17 ENCOUNTER — Ambulatory Visit
Admission: EM | Admit: 2020-09-17 | Discharge: 2020-09-17 | Disposition: A | Discharge status: Home / Self Care | Payer: BC Managed Care – PPO | Attending: Sports Medicine | Admitting: Sports Medicine

## 2020-09-17 ENCOUNTER — Other Ambulatory Visit: Payer: Self-pay

## 2020-09-17 ENCOUNTER — Encounter: Payer: Self-pay | Admitting: Emergency Medicine

## 2020-09-17 DIAGNOSIS — J209 Acute bronchitis, unspecified: Secondary | ICD-10-CM | POA: Diagnosis not present

## 2020-09-17 DIAGNOSIS — R059 Cough, unspecified: Secondary | ICD-10-CM | POA: Diagnosis not present

## 2020-09-17 DIAGNOSIS — J069 Acute upper respiratory infection, unspecified: Secondary | ICD-10-CM | POA: Diagnosis not present

## 2020-09-17 DIAGNOSIS — R0600 Dyspnea, unspecified: Secondary | ICD-10-CM | POA: Diagnosis not present

## 2020-09-17 DIAGNOSIS — R0609 Other forms of dyspnea: Secondary | ICD-10-CM

## 2020-09-17 MED ORDER — BENZONATATE 100 MG PO CAPS: 100.0000 mg | ORAL_CAPSULE | Freq: Three times a day (TID) | ORAL | 0 refills | Status: DC | PRN

## 2020-09-17 MED ORDER — AZITHROMYCIN 250 MG PO TABS: 250.0000 mg | ORAL_TABLET | Freq: Every day | ORAL | 0 refills | Status: DC

## 2020-09-17 NOTE — ED Triage Notes (Signed)
Pt c/o cough, shortness of breath, chills, subjective fever. Daughter states pt had covid in February and has been having symptoms on and off since. Pt is having pain in her upper back and chest when she takes a deep breath. She has h/o COPD.

## 2020-09-18 NOTE — ED Provider Notes (Signed)
[facility]    CSN: [id number] Arrival date & time: 09/17/20  1208      History   Chief Complaint Chief Complaint  Patient presents with  . Cough    HPI Sandy Eaton is a 80 y.o. female.   Patient is a pleasant 80 year old female who presents with a family member for evaluation of the above issue.  She reports cough, shortness of breath, chills, and subjective fever for several days now.  Family member states that she had Covid in February and has been having symptoms off and on since that diagnosis.  She is also having some discomfort in her upper back and chest when she takes a deep breath.  She does have a history of COPD.  Review of her medications indicates that she is on multiple inhalers and she seems to be maximized on medical therapy.  She sees [facility] here in [place] for ongoing medical care but could not get in today.  She sees [place] for her pulmonology needs.  No substernal chest pain, or concerns for cardiac chest pain.  No red flag signs or symptoms elicited on history.       Past Medical History:  Diagnosis Date  . Asthma   . Chronic asthmatic bronchitis (HCC)   . COPD (chronic obstructive pulmonary disease) (HCC)    Suspected  . Dyspnea    on exertion  . Environmental allergies   . GERD (gastroesophageal reflux disease)   . Headache    migraine without aura, not intractable  . Hyperlipidemia   . Osteopenia    of lumbar spine  . Pneumonia    3 times    Patient Active Problem List   Diagnosis Date Noted  . Chronic asthmatic bronchitis (HCC) 01/02/2019  . Pneumonia 05/05/2015  . Hospital acquired PNA 11/26/2014  . DOE (dyspnea on exertion) 02/24/2014  . Cough 02/24/2014    Past Surgical History:  Procedure Laterality Date  . APPENDECTOMY    . COLONOSCOPY WITH PROPOFOL N/A 08/19/2020   Procedure: COLONOSCOPY WITH PROPOFOL;  Surgeon: Regis BillLocklear, Cameron T, MD;  Location: [facility] END[facility]COPY;  Service: Endoscopy;  Laterality:  N/A;  [facility] COVID POSITIVE TEST - ATTACHED TO CHART  . ESOPHAGOGASTRODUODENOSCOPY (EGD) WITH PROPOFOL N/A 08/19/2020   Procedure: ESOPHAGOGASTRODUODENOSCOPY (EGD) WITH PROPOFOL;  Surgeon: Regis BillLocklear, Cameron T, MD;  Location: [facility] ENDOSCOPY;  Service: Endoscopy;  Laterality: N/A;  . FEMORAL HERNIA REPAIR Right 01/15/2017   Procedure: HERNIA REPAIR FEMORAL;  Surgeon: Nadeen LandauSmith, Jarvis Wilton, MD;  Location: [facility] ORS;  Service: General;  Laterality: Right;  . ROTATOR CUFF REPAIR Right   . TONSILLECTOMY    . VAGINAL HYSTERECTOMY     1960's    OB History    Gravida  3   Para      Term      Preterm      AB      Living  2     SAB      IAB      Ectopic      Multiple      Live Births               Home Medications    Prior to Admission medications   Medication Sig Start Date End Date Taking? Authorizing Provider  acetaminophen (TYLENOL) 500 MG tablet Take 1,000 mg by mouth 2 (two) times daily as needed for mild pain or headache.   Yes [provider]  albuterol (PROVENTIL) (2.5 MG/3ML) 0.083% nebulizer solution  Take 3 mLs (2.5 mg total) by nebulization every 6 (six) hours as needed for wheezing or shortness of breath. 06/11/18  Yes Salary, Montell D, MD  Albuterol Sulfate (PROAIR RESPICLICK) 108 (90 Base) MCG/ACT AEPB Inhale 2 puffs into the lungs every 4 (four) hours as needed. 06/11/18  Yes Salary, Evelena Asa, MD  aspirin EC 81 MG tablet Take 81 mg by mouth daily.   Yes [provider]  atorvastatin (LIPITOR) 40 MG tablet Take 40 mg by mouth at bedtime.   Yes [provider]  azithromycin (ZITHROMAX) 250 MG tablet Take 1 tablet (250 mg total) by mouth daily. Take first 2 tablets together, then 1 every day until finished. 09/17/20  Yes Delton See, MD  benzonatate (TESSALON) 100 MG capsule Take 1 capsule (100 mg total) by mouth 3 (three) times daily as needed for cough. 09/17/20  Yes Delton See, MD  calcium-vitamin D (OSCAL WITH D) 500-200 MG-UNIT  tablet Take 1 tablet by mouth.   Yes [provider]  cetirizine (ZYRTEC) 10 MG tablet Take 1 tablet (10 mg total) by mouth daily. 08/20/19  Yes Verlee Monte, NP  diphenhydrAMINE (BENADRYL) 25 MG tablet Take 25 mg by mouth at bedtime as needed.   Yes [provider]  fluconazole (DIFLUCAN) 200 MG tablet Take 2 tablets on day #1, then 1 tablet daily for 13 days 08/23/20  Yes Glenford Bayley, NP  fluticasone [facility]) 50 MCG/ACT nasal spray Place 1 spray into both nostrils daily as needed for rhinitis.    Yes [provider]  gabapentin (NEURONTIN) 300 MG capsule Take 300 mg by mouth 3 (three) times daily. 05/12/19  Yes [provider]  montelukast (SINGULAIR) 10 MG tablet Take 1 tablet (10 mg total) by mouth at bedtime. 02/23/20  Yes Glenford Bayley, NP  POTASSIUM CHLORIDE PO Take by mouth.   Yes [provider]  Respiratory Therapy Supplies (FLUTTER) DEVI Use 10-15 times daily 12/24/17  Yes Merwyn Katos, MD  sertraline (ZOLOFT) 50 MG tablet Take 50 mg by mouth daily. 07/23/19  Yes [provider]  tetrahydrozoline-zinc (VISINE-AC) 0.05-0.25 % ophthalmic solution Place 1 drop into both eyes 3 (three) times daily as needed (irritated eyes).   Yes [provider]  budesonide-formoterol (SYMBICORT) 160-4.5 MCG/ACT inhaler Inhale 2 puffs into the lungs 2 (two) times daily.    [provider]  Fluticasone-Salmeterol (WIXELA INHUB) 250-50 MCG/DOSE AEPB Inhale 1 puff into the lungs 2 (two) times daily. 02/24/20   Glenford Bayley, NP  meloxicam (MOBIC) 15 MG tablet Take 15 mg by mouth daily. 03/04/19   [provider]  omeprazole (PRILOSEC) 40 MG capsule Take 40 mg by mouth daily.     [provider]    Family History Family History  Problem Relation Age of Onset  . COPD Father   . Asthma Daughter   . Asthma Grandchild   . Cancer Daughter        breast  . Breast cancer Daughter   . Other Mother        MVA     Social History Social History   Tobacco Use  . Smoking status: Former Smoker    Packs/day: 1.00    Years: 30.00    Pack years: 30.00    Types: Cigarettes    Quit date: 02/24/2009    Years since quitting: 11.5  . Smokeless tobacco: Never Used  Vaping Use  . Vaping Use: Never used  Substance Use Topics  .  Alcohol use: No  . Drug use: No     Allergies   Augmentin [amoxicillin-pot clavulanate], Escitalopram oxalate, and Valium [diazepam]   Review of Systems Review of Systems  Constitutional: Positive for chills and fever. Negative for activity change, appetite change, diaphoresis and fatigue.  HENT: Negative.  Negative for congestion, postnasal drip, rhinorrhea, sinus pressure, sinus pain, sneezing and sore throat.   Eyes: Negative.  Negative for photophobia, pain, redness and visual disturbance.  Respiratory: Positive for cough and shortness of breath.   Cardiovascular: Negative.  Negative for chest pain and palpitations.  Gastrointestinal: Negative.  Negative for abdominal pain.  Genitourinary: Negative.  Negative for dysuria.  Musculoskeletal: Positive for back pain. Negative for joint swelling, myalgias, neck pain and neck stiffness.  Skin: Negative.  Negative for color change, pallor, rash and wound.  Neurological: Negative.  Negative for dizziness, tremors, seizures, syncope, speech difficulty, weakness, light-headedness, numbness and headaches.  All other systems reviewed and are negative.    Physical Exam Triage Vital Signs ED Triage Vitals  Enc Vitals Group     BP 09/17/20 1239 117/84     Pulse Rate 09/17/20 1239 76     Resp 09/17/20 1239 20     Temp 09/17/20 1239 97.7 F (36.5 C)     Temp Source 09/17/20 1239 Oral     SpO2 09/17/20 1239 96 %     Weight 09/17/20 1235 145 lb 1 oz (65.8 kg)     Height 09/17/20 1235 5' 3.5" (1.613 m)     Head Circumference --      Peak Flow --      Pain Score 09/17/20 1235 8     Pain Loc --      Pain Edu? --      Excl.  in GC? --    No data found.  Updated Vital Signs BP 117/84 (BP Location: Left Arm)   Pulse 76   Temp 97.7 F (36.5 C) (Oral)   Resp 20   Ht 5' 3.5" (1.613 m)   Wt 65.8 kg   SpO2 96%   BMI 25.29 kg/m   Visual Acuity Right Eye Distance:   Left Eye Distance:   Bilateral Distance:    Right Eye Near:   Left Eye Near:    Bilateral Near:     Physical Exam Vitals and nursing note reviewed.  Constitutional:      General: She is not in acute distress.    Appearance: Normal appearance. She is not ill-appearing, toxic-appearing or diaphoretic.  HENT:     Head: Normocephalic and atraumatic.     Right Ear: Tympanic membrane normal.     Left Ear: Tympanic membrane normal.     Nose: Nose normal. No congestion or rhinorrhea.     Mouth/Throat:     Mouth: Mucous membranes are moist.     Pharynx: No oropharyngeal exudate or posterior oropharyngeal erythema.  Eyes:     General: No scleral icterus.       Right eye: No discharge.        Left eye: No discharge.     Extraocular Movements: Extraocular movements intact.     Conjunctiva/sclera: Conjunctivae normal.     Pupils: Pupils are equal, round, and reactive to light.  Cardiovascular:     Rate and Rhythm: Normal rate and regular rhythm.     Pulses: Normal pulses.     Heart sounds: Normal heart sounds. No murmur heard. No friction rub. No gallop.   Pulmonary:  Effort: Pulmonary effort is normal. No respiratory distress or retractions.     Breath sounds: No stridor or decreased air movement. Examination of the right-upper field reveals wheezing. Examination of the left-upper field reveals wheezing. Examination of the right-middle field reveals wheezing. Examination of the left-middle field reveals wheezing. Examination of the right-lower field reveals wheezing. Examination of the left-lower field reveals wheezing. Wheezing present. No decreased breath sounds, rhonchi or rales.     Comments: Diffuse mild wheezing heard in both lung  fields.  Consistent with COPD exacerbation. Musculoskeletal:     Cervical back: Normal range of motion and neck supple. No rigidity or tenderness.  Lymphadenopathy:     Cervical: No cervical adenopathy.  Skin:    General: Skin is warm and dry.     Capillary Refill: Capillary refill takes less than 2 seconds.     Findings: No erythema, lesion or rash.  Neurological:     General: No focal deficit present.     Mental Status: She is alert and oriented to person, place, and time.      UC Treatments / Results  Labs (all labs ordered are listed, but only abnormal results are displayed) Labs Reviewed - No data to display  EKG   Radiology No results found.  Procedures Procedures (including critical care time)  Medications Ordered in UC Medications - No data to display  Initial Impression / Assessment and Plan / UC Course  I have reviewed the triage vital signs and the nursing notes.  Pertinent labs & imaging results that were available during my care of the patient were reviewed by me and considered in my medical decision making (see chart for details).  Clinical impression: 80 year old female with known COPD presents with cough, dyspnea on exertion, and probable viral upper respiratory infection.  Her exam is consistent with bronchitis.  Given her COPD I will treat a little more aggressively.  Treatment plan: 1.  The findings and treatment plan were discussed in detail with the patient.  Patient was in agreement. 2.  Prescribed azithromycin. 3.  Gave her some Tessalon Perles to use for her cough. 4.  Educational handouts were provided. 5.  I will hold on steroids at the present time given that her symptoms are mild.  If she does not respond then that would be the next step. 6.  Advised her to see her primary care provider if her symptoms persist.  She is also welcome to come back here.  Certainly if she was to worsen in any way she should go to the emergency room. 7.   Over-the-counter meds as needed, Tylenol or Motrin for fever or discomfort.  Plenty of rest and plenty of fluids. 8.  Follow-up as needed.    Final Clinical Impressions(s) / UC Diagnoses   Final diagnoses:  Viral upper respiratory tract infection  Acute bronchitis, unspecified organism  Cough  DOE (dyspnea on exertion)   Discharge Instructions   None    ED Prescriptions    Medication Sig Dispense Auth. Provider   azithromycin (ZITHROMAX) 250 MG tablet Take 1 tablet (250 mg total) by mouth daily. Take first 2 tablets together, then 1 every day until finished. 6 tablet Delton See, MD   benzonatate (TESSALON) 100 MG capsule Take 1 capsule (100 mg total) by mouth 3 (three) times daily as needed for cough. 30 capsule Delton See, MD     PDMP not reviewed this encounter.   Delton See, MD 09/19/20 [phone]

## 2020-09-20 ENCOUNTER — Telehealth: Payer: Self-pay | Admitting: Primary Care

## 2020-09-20 MED ORDER — BUDESONIDE-FORMOTEROL FUMARATE 80-4.5 MCG/ACT IN AERO: 2.0000 | INHALATION_SPRAY | Freq: Two times a day (BID) | RESPIRATORY_TRACT | 12 refills | Status: DC

## 2020-09-20 MED ORDER — PROAIR RESPICLICK 108 (90 BASE) MCG/ACT IN AEPB: 2.0000 | INHALATION_SPRAY | RESPIRATORY_TRACT | 3 refills | Status: DC | PRN

## 2020-09-20 NOTE — Telephone Encounter (Signed)
Patient is aware of below recommendations.  Patient stated that she has taken Symbicort previously without any issues.  Rx for Symbicort 80 and albuterol has been sent to preferred pharmacy.  She has a spacer at home that she will use with Symbicort.  wixela has been added to allergy list.  Nothing further needed at this time.

## 2020-09-20 NOTE — Telephone Encounter (Signed)
Called and spoke to patient. Patient is requesting refills on albuterol HFA. This medication was not originally prescribed by our office nor mentioned in last OV note.  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 She is currently being treated with Diflucan. Beth prescribed diflucan on 08/23/2020 for 14 days and GI extended course on 09/03/2020 for 14 additional days.  Patient is requesting an alternative to advair.   Beth, please advise on albuterol and alternative to advair. thanks

## 2020-09-20 NOTE — Telephone Encounter (Signed)
I am fine with [place] refilling Albuterol. Please remove Wixella and write intolerance/thrush. She has Symbicort on her list? Can she take this, would add spacer

## 2020-09-20 NOTE — Telephone Encounter (Signed)
I would prob use low dose symbicort

## 2020-09-27 ENCOUNTER — Other Ambulatory Visit: Payer: Self-pay

## 2020-09-27 ENCOUNTER — Ambulatory Visit (INDEPENDENT_AMBULATORY_CARE_PROVIDER_SITE_OTHER): Payer: BC Managed Care – PPO | Admitting: Primary Care

## 2020-09-27 ENCOUNTER — Encounter: Payer: Self-pay | Admitting: Primary Care

## 2020-09-27 VITALS — BP 110/80 | HR 77 | Temp 97.7°F | Ht 61.14 in | Wt 150.6 lb

## 2020-09-27 DIAGNOSIS — J449 Chronic obstructive pulmonary disease, unspecified: Secondary | ICD-10-CM

## 2020-09-27 MED ORDER — BENZONATATE 100 MG PO CAPS: 100.0000 mg | ORAL_CAPSULE | Freq: Three times a day (TID) | ORAL | 2 refills | Status: DC | PRN

## 2020-09-27 MED ORDER — SPIRIVA RESPIMAT 1.25 MCG/ACT IN AERS: 2.0000 | INHALATION_SPRAY | Freq: Every day | RESPIRATORY_TRACT | 0 refills | Status: DC

## 2020-09-27 MED ORDER — SPIRIVA RESPIMAT 1.25 MCG/ACT IN AERS: 2.0000 | INHALATION_SPRAY | Freq: Every day | RESPIRATORY_TRACT | 5 refills | Status: DC

## 2020-09-27 MED ORDER — PROAIR RESPICLICK 108 (90 BASE) MCG/ACT IN AEPB: 2.0000 | INHALATION_SPRAY | RESPIRATORY_TRACT | 3 refills | Status: AC | PRN

## 2020-09-27 MED ORDER — MONTELUKAST SODIUM 10 MG PO TABS: 10.0000 mg | ORAL_TABLET | Freq: Every day | ORAL | 5 refills | Status: DC

## 2020-09-27 NOTE — Patient Instructions (Addendum)
Recommendations: - Stop Symbicort and Wixela (discontinuing inhaled steroid d.t thrush) - Start Spiriva respimat 1.25- take two puff once daily in morning (this is a bronchodilator used for asthma and COPD) - Continue Singulair (montelukast) 10mg  at bedtime  - Use Albuterol 2 puffs every 6 hours as needed for shortness of breath/wheezing - Contact GI regarding persistent esophageal discomfort/thrush symptoms   Rx: - Albuterol rescue inhaler  - Benzonatate cough pills  - Spiriva respimat 1.25  Orders: - PFTs in 3 months  Follow-up - 3 months with either DR. Kasa or (new patient)

## 2020-09-27 NOTE — Progress Notes (Signed)
@Patient  ID: , female    DOB: 01-29-1941, 80 y.o.   MRN: [id number]  Chief Complaint  Patient presents with  . Follow-up    Feels that her breathing isn't well, SOB coughing more milky phlegm,    Referring provider: [place]  HPI: 80 year old female, former smoker. PMH significant for chronic asthmatic bronchitis, pneumonia, dyspnea on exertion. Previous patient of Dr. 76, last seen on 01/02/19. Normal PFTs in 2017.   Previous [facility] pulmonary encounter: 02/23/2020 Patient presents today for 1 year follow-up asthma. Accompanied by her daughter. Patient reports increased shortness of breath over the last month. She has been out of work since July. Daughter feels she gets winded easier over the last two years. Works as a [employer]. She works 8 hours, states that lately just being there causes her to be short winded. Heat affects her breathing. She likes to work. PCP increased Symbicort 160. Most days she only uses Symbicort inhaler once a day. States that most of the time she just forgets to take her second does if she is busy that day. Inhaler cost her 90 dollars. She takes Singulair 10mg  as prescribed. She has right sided back pain, this appears chronic but is on the same side she has had previous pneumonia.   09/27/2020- Interim hx Patient presents today for follow-up chronic asthmatic bronchitis. Accompanied by her daughter. Patient experiencing thrush symptoms with Wixella. She had an upper endoscopy that showed growth of yeast. She was treated with diflucan for a total of 4 weeks. Changed Wixella to low dose Symbicort and added a spacer. She reports symptoms of shortness of breath and productive cough with clear/beige mucus. Benzonatate helps her cough, needs refill. She is compliant with Singulair 10mg  at bedtime. She is still dealing with esophageal discomfort.  She has chronic right upper back pain.   Pulmonary function testing: 06/22/15 FVC 3.20  (113%), FEV1 2.29 (108%), ratio 72  Imaging: 02/26/20 CTA- No PE. No edema or airspace opacity. Chronic scarring and atelectatic change in the inferior lingula. Stable apical scarring. No pleural effusions. No evident adenopathy.  Allergies  Allergen Reactions  . Augmentin [Amoxicillin-Pot Clavulanate] Nausea And Vomiting    Has patient had a PCN reaction causing immediate rash, facial/tongue/throat swelling, SOB or lightheadedness with hypotension: No Has patient had a PCN reaction causing severe rash involving mucus membranes or skin necrosis: No Has patient had a PCN reaction that required hospitalization: No Has patient had a PCN reaction occurring within the last 10 years: No If all of the above answers are "NO", then may proceed with Cephalosporin use.   . Escitalopram Oxalate Diarrhea  . Valium [Diazepam] Other (See Comments)    Reaction:  Hallucinations   . Wixela Inhub [Fluticasone-Salmeterol]     Thrush      Immunization History  Administered Date(s) Administered  . Influenza Split 03/26/2013  . Influenza, High Dose Seasonal PF 02/15/2016, 03/07/2017, 02/17/2019, 02/17/2019  . Influenza, Seasonal, Injecte, Preservative Fre 03/18/2013  . Influenza,inj,Quad PF,6+ Mos 05/06/2015  . Influenza-Unspecified 03/31/2014, 05/06/2015, 02/16/2016  . PFIZER(Purple Top)SARS-COV-2 Vaccination 07/22/2019, 08/12/2019, 04/09/2020  . Pneumococcal Conjugate-13 01/26/2015  . Pneumococcal Polysaccharide-23 02/22/2016, 01/04/2018  . Tdap 02/10/2013  . Zoster 03/18/2014  . Zoster Recombinat (Shingrix) 04/02/2019, 07/03/2019    Past Medical History:  Diagnosis Date  . Asthma   . Chronic asthmatic bronchitis (HCC)   . COPD (chronic obstructive pulmonary disease) (HCC)    Suspected  . Dyspnea  on exertion  . Environmental allergies   . GERD (gastroesophageal reflux disease)   . Headache    migraine without aura, not intractable  . Hyperlipidemia   . Osteopenia    of lumbar spine   . Pneumonia    3 times    Tobacco History: Social History   Tobacco Use  Smoking Status Former Smoker  . Packs/day: 0.50  . Years: 30.00  . Pack years: 15.00  . Types: Cigarettes  . Quit date: 02/24/2009  . Years since quitting: 11.5  Smokeless Tobacco Never Used   Counseling given: Not Answered   Outpatient Medications Prior to Visit  Medication Sig Dispense Refill  . acetaminophen (TYLENOL) 500 MG tablet Take 1,000 mg by mouth 2 (two) times daily as needed for mild pain or headache.    . albuterol (PROVENTIL) (2.5 MG/3ML) 0.083% nebulizer solution Take 3 mLs (2.5 mg total) by nebulization every 6 (six) hours as needed for wheezing or shortness of breath. 75 mL 12  . aspirin EC 81 MG tablet Take 81 mg by mouth daily.    . calcium-vitamin D (OSCAL WITH D) 500-200 MG-UNIT tablet Take 1 tablet by mouth.    . diphenhydrAMINE (BENADRYL) 25 MG tablet Take 25 mg by mouth at bedtime as needed.    . fluticasone (FLONASE) 50 MCG/ACT nasal spray Place 1 spray into both nostrils daily as needed for rhinitis.     Marland Kitchen gabapentin (NEURONTIN) 300 MG capsule Take 300 mg by mouth 3 (three) times daily.    . sertraline (ZOLOFT) 50 MG tablet Take 50 mg by mouth daily.    . Albuterol Sulfate (PROAIR RESPICLICK) 108 (90 Base) MCG/ACT AEPB Inhale 2 puffs into the lungs every 4 (four) hours as needed. 1 each 3  . benzonatate (TESSALON) 100 MG capsule Take 1 capsule (100 mg total) by mouth 3 (three) times daily as needed for cough. 30 capsule 0  . budesonide-formoterol (SYMBICORT) 80-4.5 MCG/ACT inhaler Inhale 2 puffs into the lungs in the morning and at bedtime. 1 each 12  . montelukast (SINGULAIR) 10 MG tablet Take 1 tablet (10 mg total) by mouth at bedtime. 30 tablet 5  . Respiratory Therapy Supplies (FLUTTER) DEVI Use 10-15 times daily 1 each 0  . omeprazole (PRILOSEC) 40 MG capsule Take 40 mg by mouth daily.     Marland Kitchen POTASSIUM CHLORIDE PO Take by mouth. (Patient not taking: Reported on 09/27/2020)    .  atorvastatin (LIPITOR) 40 MG tablet Take 40 mg by mouth at bedtime.    Marland Kitchen azithromycin (ZITHROMAX) 250 MG tablet Take 1 tablet (250 mg total) by mouth daily. Take first 2 tablets together, then 1 every day until finished. 6 tablet 0  . cetirizine (ZYRTEC) 10 MG tablet Take 1 tablet (10 mg total) by mouth daily. 30 tablet 0  . fluconazole (DIFLUCAN) 200 MG tablet Take 2 tablets on day #1, then 1 tablet daily for 13 days 15 tablet 0  . meloxicam (MOBIC) 15 MG tablet Take 15 mg by mouth daily.    Marland Kitchen tetrahydrozoline-zinc (VISINE-AC) 0.05-0.25 % ophthalmic solution Place 1 drop into both eyes 3 (three) times daily as needed (irritated eyes).     No facility-administered medications prior to visit.    Review of Systems  Review of Systems  Constitutional: Negative.   Respiratory: Positive for cough and shortness of breath.   Cardiovascular: Negative.     Physical Exam  BP 110/80 (BP Location: Left Arm, Patient Position: Sitting, Cuff Size: Normal)  Pulse 77   Temp 97.7 F (36.5 C) (Temporal)   Ht 5' 1.14" (1.553 m)   Wt 150 lb 9.6 oz (68.3 kg)   SpO2 95%   BMI 28.32 kg/m  Physical Exam Constitutional:      Appearance: Normal appearance.  HENT:     Mouth/Throat:     Comments: Dry tongue, linear fissures  Cardiovascular:     Rate and Rhythm: Normal rate and regular rhythm.  Pulmonary:     Effort: Pulmonary effort is normal. No respiratory distress.     Breath sounds: Normal breath sounds. No wheezing, rhonchi or rales.     Comments: CTA Neurological:     General: No focal deficit present.     Mental Status: She is alert and oriented to person, place, and time. Mental status is at baseline.  Psychiatric:        Mood and Affect: Mood normal.        Behavior: Behavior normal.        Thought Content: Thought content normal.        Judgment: Judgment normal.      Lab Results:  CBC    Component Value Date/Time   WBC 8.3 02/23/2020 1733   RBC 4.20 02/23/2020 1733   HGB 13.4  02/23/2020 1733   HGB 13.6 01/31/2014 1120   HCT 39.0 02/23/2020 1733   HCT 41.4 01/31/2014 1120   PLT 188 02/23/2020 1733   PLT 178 01/31/2014 1120   MCV 92.9 02/23/2020 1733   MCV 97 01/31/2014 1120   MCH 31.9 02/23/2020 1733   MCHC 34.4 02/23/2020 1733   RDW 12.2 02/23/2020 1733   RDW 12.3 01/31/2014 1120   LYMPHSABS 2.7 02/23/2020 1733   MONOABS 0.8 02/23/2020 1733   EOSABS 0.3 02/23/2020 1733   BASOSABS 0.1 02/23/2020 1733    BMET    Component Value Date/Time   NA 140 11/06/2018 1732   NA 140 01/31/2014 1120   K 4.0 11/06/2018 1732   K 3.6 01/31/2014 1120   CL 108 11/06/2018 1732   CL 105 01/31/2014 1120   CO2 24 11/06/2018 1732   CO2 23 01/31/2014 1120   GLUCOSE 167 (H) 11/06/2018 1732   GLUCOSE 149 (H) 01/31/2014 1120   BUN 17 11/06/2018 1732   BUN 13 01/31/2014 1120   CREATININE 0.61 11/06/2018 1732   CREATININE 0.88 01/31/2014 1120   CALCIUM 8.8 (L) 11/06/2018 1732   CALCIUM 8.1 (L) 01/31/2014 1120   GFRNONAA >60 11/06/2018 1732   GFRNONAA >60 01/31/2014 1120   GFRAA >60 11/06/2018 1732   GFRAA >60 01/31/2014 1120    BNP    Component Value Date/Time   BNP 9.0 05/05/2015 0817    ProBNP No results found for: PROBNP  Imaging: No results found.   Assessment & Plan:   Chronic asthmatic bronchitis (HCC) - Normal spirometry in 2017. Chronic dyspnea and productive cough with clear mucus. Due to persistent thrush symptoms recommend discontinuing ICS. Change Symbicort to Spiriva 1.21mcg. Continue Singulair 10mg  QHS and prn albuterol. Refilling tessalon perles 100mg  TID for cough. Needs repeat pulmonary function testing. FU in 3 months with either Dr. or (new patient)   Belia Heman, NP 09/27/2020

## 2020-09-27 NOTE — Assessment & Plan Note (Addendum)
-   Normal spirometry in 2017. Chronic dyspnea and productive cough with clear mucus. Due to persistent thrush symptoms recommend discontinuing ICS. Change Symbicort to Spiriva 1.23mcg. Continue Singulair 10mg  QHS and prn albuterol. Refilling tessalon perles 100mg  TID for cough. Needs repeat pulmonary function testing. FU in 3 months with either Dr. or (new patient)

## 2020-09-28 ENCOUNTER — Telehealth: Payer: Self-pay | Admitting: Primary Care

## 2020-09-28 NOTE — Telephone Encounter (Signed)
PA for proair has been started via covermymeds.  DZH:[id number]

## 2020-10-04 NOTE — Telephone Encounter (Signed)
PA has been approved via CMM.  Will contact [facility] once pharmacy is open.

## 2020-10-04 NOTE — Telephone Encounter (Signed)
Sandy Eaton with [facility] pharmacy is aware of approval and voiced her understanding.  Nothing further needed at this time.

## 2020-10-17 ENCOUNTER — Telehealth: Payer: Self-pay | Admitting: Primary Care

## 2020-10-21 NOTE — Telephone Encounter (Signed)
Called and left message regarding FMLA forms from [place] - closing encounter -pr

## 2020-11-16 ENCOUNTER — Telehealth: Payer: Self-pay | Admitting: Primary Care

## 2020-11-16 NOTE — Telephone Encounter (Signed)
ATC daughter Alfonse Alpers, Jarome Lamas, on [place], requested a return call to clarify what is needed.  Will await return call.

## 2020-11-17 NOTE — Telephone Encounter (Signed)
Rec'd FMLA paperwork 4/28 and I had tried to call pt to discuss reason and explain the forms process, attempted 2x and never got a response. Spoke to patient daughter today - she now has accommodation forms b/c her mom exhausted her FMLA. She will email them to me to prepare for signature-pr

## 2020-11-17 NOTE — Telephone Encounter (Signed)
Sandy Eaton daughter is returning phone call. Lora phone number is [phone]. Faxed forms to [place] for work accomendation. May leave detailed message on daughter's voicemail.

## 2020-11-17 NOTE — Telephone Encounter (Addendum)
Called Lora and there was no answer- LMTCB I checked with [facility] and she has not received anything nor are the forms up front

## 2020-11-21 NOTE — Telephone Encounter (Signed)
Prepared accommodation form - left message for patient daughter Mervyn Gay to discuss the leave of absence form that was also including - needing to know if patient is currently on a LOA or is requesting one. -pr

## 2020-11-22 NOTE — Telephone Encounter (Signed)
Spoke to pt daughter - prepared forms and will give to BW to complete -pr

## 2020-11-23 NOTE — Telephone Encounter (Signed)
Rec'd completed forms back from [place] - Faxed to [place] at [phone] -pr

## 2020-11-25 ENCOUNTER — Other Ambulatory Visit: Payer: Self-pay | Admitting: Primary Care

## 2020-12-06 ENCOUNTER — Telehealth: Payer: Self-pay | Admitting: Primary Care

## 2020-12-06 NOTE — Telephone Encounter (Signed)
Rec'd additional info request from [place] - questioning whether patient is able to stand for 2 hours at a time - Left message on pt vm to call back-pr

## 2020-12-08 NOTE — Telephone Encounter (Signed)
Pts daughter is returning phone call. Pls regard; [phone]

## 2020-12-08 NOTE — Telephone Encounter (Signed)
I had not received a call back from Ms. Castanon's daughter - sent her an email to follow up -pr

## 2020-12-09 NOTE — Telephone Encounter (Signed)
Rec'd email back from patient's daughter - patient unable to stand for 2 hours at a time. Updated forms and emailed to [employer] for [employer]

## 2020-12-23 NOTE — Telephone Encounter (Signed)
Following up with Sandy Eaton- since last encounter - pt daughter emailed request for medical records to be sent to [facility] - fwd that to Lauren as well to complete. -pr

## 2020-12-23 NOTE — Telephone Encounter (Signed)
This was faxed last week and then sent to scan.  Sending over office notes with another form from [place] [employer] to [phone]

## 2020-12-29 ENCOUNTER — Telehealth: Payer: Self-pay | Admitting: Primary Care

## 2020-12-29 NOTE — Telephone Encounter (Signed)
Pt stated that she is needing to have FMLA paperwork sent to [facility] and Damaris Hippo stated that if possible she needs it to be sent as soon as possible. States that she needs the last two visits to be sent.  Pls regard; [phone]

## 2021-01-05 NOTE — Telephone Encounter (Signed)
Nothing noted in message. Will close encounter.  

## 2021-01-23 NOTE — Telephone Encounter (Signed)
Per telephone notes from Maryfrances Bunnell, requested OV notes were faxed to [facility] on 7/8.

## 2021-02-01 ENCOUNTER — Telehealth: Payer: Self-pay | Admitting: Primary Care

## 2021-02-01 MED ORDER — PREDNISONE 10 MG PO TABS: ORAL_TABLET | ORAL | 0 refills | Status: DC

## 2021-02-01 NOTE — Telephone Encounter (Signed)
Attempted to call pt but line rings busy. Tried to call pt back but still received a busy tone. Will try to call back later.

## 2021-02-01 NOTE — Telephone Encounter (Signed)
Pt stated that she has been having more difficulty breathing for about a week she said it seems like it is not going away, stated that she is very tight in her chest and throat, pt stated that she is coughing a lot and she is coughing up "milky" looking mucus, pt states that sometimes she does experience wheezing.  Pharmacy; [facility] [address] [place] - [address]  [address] [place] [address]   Pls regard; [phone]

## 2021-02-01 NOTE — Telephone Encounter (Signed)
Make sure she is still taking Spiriva 1.90mcg and Singulair 10mg  at bedtime. I will send in prednisone taper. Advise she take mucinex 1,200mg  twice a day for 1 week. She can use albuterol rescue inhaler every 6 hours. She is overdue for visit, needs to establish with either Dr. or G in [place] with PFTs. Rx sent to [facility].

## 2021-02-01 NOTE — Telephone Encounter (Signed)
Called patient twice and received the busy tone each time. Will try again later.

## 2021-02-01 NOTE — Telephone Encounter (Signed)
Called and spoke with patient who states that she has been having more difficulty breathing for about a week she said it seems like it is not going away, stated that she is very tight in her chest and throat, pt stated that she is coughing a lot and she is coughing up "milky" looking mucus, pt states that sometimes she does experience wheezing. Is not sleeping good. Denies any fevers.   Beth please advise

## 2021-02-02 NOTE — Telephone Encounter (Signed)
Pt called [place] office requesting to speak with someone in [place]. Pt had had called in yesterday stating increased productive cough, increased dificulty breathing and trouble sleeping. Message was sent to Ames Dura NP who called in Prednisone taper for pt and recommended Mucinex. Staff tried to reach pt yesterday to give instructions but was unable to do so.  Today pt stated that she had coughed a small amount of blood up mixed with mucus. Pt was informed Alferd Apa recommendations and medication was called. Pt was instructed to take home Covid test and call office with results. Pt also was instructed if symptoms worsened in any way to seek evaluation at Urgent Care or ED. Pt stated understanding of all instructions. Nothing further needed at time.

## 2021-02-24 ENCOUNTER — Other Ambulatory Visit: Payer: Self-pay

## 2021-02-24 ENCOUNTER — Emergency Department
Admission: EM | Admit: 2021-02-24 | Discharge: 2021-02-24 | Disposition: A | Discharge status: Home / Self Care | Payer: Medicare Other | Attending: Emergency Medicine | Admitting: Emergency Medicine

## 2021-02-24 ENCOUNTER — Emergency Department: Payer: Medicare Other

## 2021-02-24 DIAGNOSIS — J449 Chronic obstructive pulmonary disease, unspecified: Secondary | ICD-10-CM | POA: Diagnosis not present

## 2021-02-24 DIAGNOSIS — Z87891 Personal history of nicotine dependence: Secondary | ICD-10-CM | POA: Diagnosis not present

## 2021-02-24 DIAGNOSIS — Z7982 Long term (current) use of aspirin: Secondary | ICD-10-CM | POA: Diagnosis not present

## 2021-02-24 DIAGNOSIS — M542 Cervicalgia: Secondary | ICD-10-CM | POA: Diagnosis not present

## 2021-02-24 DIAGNOSIS — J45909 Unspecified asthma, uncomplicated: Secondary | ICD-10-CM | POA: Diagnosis not present

## 2021-02-24 DIAGNOSIS — R079 Chest pain, unspecified: Secondary | ICD-10-CM | POA: Diagnosis not present

## 2021-02-24 DIAGNOSIS — M79601 Pain in right arm: Secondary | ICD-10-CM | POA: Insufficient documentation

## 2021-02-24 DIAGNOSIS — M79602 Pain in left arm: Secondary | ICD-10-CM | POA: Insufficient documentation

## 2021-02-24 DIAGNOSIS — R202 Paresthesia of skin: Secondary | ICD-10-CM | POA: Diagnosis not present

## 2021-02-24 DIAGNOSIS — Z7951 Long term (current) use of inhaled steroids: Secondary | ICD-10-CM | POA: Insufficient documentation

## 2021-02-24 LAB — BASIC METABOLIC PANEL
Anion gap: 7 (ref 5–15)
BUN: 22 mg/dL (ref 8–23)
CO2: 24 mmol/L (ref 22–32)
Calcium: 8.9 mg/dL (ref 8.9–10.3)
Chloride: 107 mmol/L (ref 98–111)
Creatinine, Ser: 1.33 mg/dL — ABNORMAL HIGH (ref 0.44–1.00)
GFR, Estimated: 40 mL/min — ABNORMAL LOW (ref >60)
Glucose, Bld: 113 mg/dL — ABNORMAL HIGH (ref 70–99)
Potassium: 4 mmol/L (ref 3.5–5.1)
Sodium: 138 mmol/L (ref 135–145)

## 2021-02-24 LAB — CBC
HCT: 37.5 % (ref 36.0–46.0)
Hemoglobin: 13.1 g/dL (ref 12.0–15.0)
MCH: 33.4 pg (ref 26.0–34.0)
MCHC: 34.9 g/dL (ref 30.0–36.0)
MCV: 95.7 fL (ref 80.0–100.0)
Platelets: 181 10*3/uL (ref 150–400)
RBC: 3.92 MIL/uL (ref 3.87–5.11)
RDW: 12 % (ref 11.5–15.5)
WBC: 7.4 10*3/uL (ref 4.0–10.5)
nRBC: 0 % (ref 0.0–0.2)

## 2021-02-24 LAB — TROPONIN I (HIGH SENSITIVITY): Troponin I (High Sensitivity): 4 ng/L (ref <18)

## 2021-02-24 MED ORDER — METHYLPREDNISOLONE 4 MG PO TBPK: ORAL_TABLET | ORAL | 0 refills | Status: DC

## 2021-02-24 MED ORDER — PREDNISONE 20 MG PO TABS
60.0000 mg | ORAL_TABLET | Freq: Once | ORAL | Status: AC
  Administered 2021-02-24: 60 mg via ORAL
  Filled 2021-02-24: qty 3

## 2021-02-24 NOTE — ED Triage Notes (Signed)
Pt come with c/o upper CP, acid reflux, bilateral arm pain, and neck pain. Pt states this started few nights ago.

## 2021-02-24 NOTE — ED Provider Notes (Signed)
[facility] Emergency Department Provider Note   ____________________________________________   Event Date/Time   First MD Initiated Contact with Patient 02/24/21 1815     (approximate)  I have reviewed the triage vital signs and the nursing notes.   HISTORY  Chief Complaint Chest Pain    HPI Sandy Eaton is a 80 y.o. female who presents for pain to the neck, upper extremities, and chest  LOCATION: Neck, upper extremities, chest DURATION: 2 days TIMING: Intermittent and slightly improved since onset SEVERITY: 8/10 at its most severe QUALITY: Aching/paresthesias CONTEXT: Patient states that she began having anterior and posterior cervical pain that began approximately 2 days ago and is worse when she puts her arm above her head resulting in pain that radiates down the front of her chest as well as down bilateral arms MODIFYING FACTORS: Worsened with her arm above her head and partially relieved at rest ASSOCIATED SYMPTOMS: Paresthesias in bilateral upper extremities   Per medical record review, medical history noncontributory.          Past Medical History:  Diagnosis Date   Asthma    Chronic asthmatic bronchitis (HCC)    COPD (chronic obstructive pulmonary disease) (HCC)    Suspected   Dyspnea    on exertion   Environmental allergies    GERD (gastroesophageal reflux disease)    Headache    migraine without aura, not intractable   Hyperlipidemia    Osteopenia    of lumbar spine   Pneumonia    3 times    Patient Active Problem List   Diagnosis Date Noted   Chronic asthmatic bronchitis (HCC) 01/02/2019   Pneumonia 05/05/2015   Hospital acquired PNA 11/26/2014   DOE (dyspnea on exertion) 02/24/2014   Cough 02/24/2014    Past Surgical History:  Procedure Laterality Date   APPENDECTOMY     COLONOSCOPY WITH PROPOFOL N/A 08/19/2020   Procedure: COLONOSCOPY WITH PROPOFOL;  Surgeon: Regis Bill, MD;  Location: [facility]  ENDOSCOPY;  Service: Endoscopy;  Laterality: N/A;  [facility] COVID POSITIVE TEST - ATTACHED TO CHART   ESOPHAGOGASTRODUODENOSCOPY (EGD) WITH PROPOFOL N/A 08/19/2020   Procedure: ESOPHAGOGASTRODUODENOSCOPY (EGD) WITH PROPOFOL;  Surgeon: Regis Bill, MD;  Location: [facility] ENDOSCOPY;  Service: Endoscopy;  Laterality: N/A;   FEMORAL HERNIA REPAIR Right 01/15/2017   Procedure: HERNIA REPAIR FEMORAL;  Surgeon: Nadeen Landau, MD;  Location: [facility] ORS;  Service: General;  Laterality: Right;   ROTATOR CUFF REPAIR Right    TONSILLECTOMY     VAGINAL HYSTERECTOMY     1960's    Prior to Admission medications   Medication Sig Start Date End Date Taking? Authorizing Provider  methylPREDNISolone (MEDROL DOSEPAK) 4 MG TBPK tablet Follow instructions in package 02/24/21  Yes Brisa Auth, Clent Jacks, MD  acetaminophen (TYLENOL) 500 MG tablet Take 1,000 mg by mouth 2 (two) times daily as needed for mild pain or headache.    [provider]  albuterol (PROVENTIL) (2.5 MG/3ML) 0.083% nebulizer solution Take 3 mLs (2.5 mg total) by nebulization every 6 (six) hours as needed for wheezing or shortness of breath. 06/11/18   Salary, Evelena Asa, MD  Albuterol Sulfate (PROAIR RESPICLICK) 108 (90 Base) MCG/ACT AEPB Inhale 2 puffs into the lungs every 4 (four) hours as needed. 09/27/20   Glenford Bayley, NP  aspirin EC 81 MG tablet Take 81 mg by mouth daily.    [provider]  benzonatate (TESSALON) 100 MG capsule Take 1 capsule by mouth three times daily  as needed for cough 11/25/20   Parrett, Tammy S, NP  calcium-vitamin D (OSCAL WITH D) 500-200 MG-UNIT tablet Take 1 tablet by mouth.    [provider]  diphenhydrAMINE (BENADRYL) 25 MG tablet Take 25 mg by mouth at bedtime as needed.    [provider]  fluticasone (FLONASE) 50 MCG/ACT nasal spray Place 1 spray into both nostrils daily as needed for rhinitis.     [provider]  gabapentin (NEURONTIN) 300 MG capsule Take 300 mg by  mouth 3 (three) times daily. 05/12/19   [provider]  montelukast (SINGULAIR) 10 MG tablet Take 1 tablet (10 mg total) by mouth at bedtime. 09/27/20   Glenford Bayley, NP  omeprazole (PRILOSEC) 40 MG capsule Take 40 mg by mouth daily.     [provider]  POTASSIUM CHLORIDE PO Take by mouth. Patient not taking: Reported on 09/27/2020    [provider]  predniSONE (DELTASONE) 10 MG tablet Take 4 tabs po daily x 3 days; then 3 tabs daily x3 days; then 2 tabs daily x3 days; then 1 tab daily x 3 days; then stop 02/01/21   Glenford Bayley, NP  sertraline (ZOLOFT) 50 MG tablet Take 50 mg by mouth daily. 07/23/19   [provider]  Tiotropium Bromide Monohydrate (SPIRIVA RESPIMAT) 1.25 MCG/ACT AERS Inhale 2 puffs into the lungs daily. 09/27/20   Glenford Bayley, NP  Tiotropium Bromide Monohydrate (SPIRIVA RESPIMAT) 1.25 MCG/ACT AERS Inhale 2 puffs into the lungs daily. 10/07/20   Glenford Bayley, NP    Allergies Augmentin [amoxicillin-pot clavulanate], Escitalopram oxalate, Valium [diazepam], and Wixela inhub [fluticasone-salmeterol]  Family History  Problem Relation Age of Onset   COPD Father    Asthma Daughter    Asthma Grandchild    Cancer Daughter        breast   Breast cancer Daughter    Other Mother        MVA    Social History Social History   Tobacco Use   Smoking status: Former    Packs/day: 0.50    Years: 30.00    Pack years: 15.00    Types: Cigarettes    Quit date: 02/24/2009    Years since quitting: 12.0   Smokeless tobacco: Never  Vaping Use   Vaping Use: Never used  Substance Use Topics   Alcohol use: No   Drug use: No    Review of Systems Constitutional: No fever/chills Eyes: No visual changes. ENT: Endorses anterior/posterior neck pain no sore throat. Cardiovascular: Endorses chest pain. Respiratory: Denies shortness of breath. Gastrointestinal: No abdominal pain.  No nausea, no vomiting.  No  diarrhea. Genitourinary: Negative for dysuria. Musculoskeletal: Negative for acute arthralgias Skin: Negative for rash. Neurological: Endorses paresthesias in bilateral upper extremities intermittently.  Negative for headaches, weakness/numbness in any extremity Psychiatric: Negative for suicidal ideation/homicidal ideation ____________________________________________   PHYSICAL EXAM:  VITAL SIGNS: ED Triage Vitals  Enc Vitals Group     BP 02/24/21 1810 110/69     Pulse Rate 02/24/21 1810 85     Resp 02/24/21 1810 18     Temp 02/24/21 1810 98 F (36.7 C)     Temp Source 02/24/21 2030 Oral     SpO2 02/24/21 1810 95 %     Weight --      Height --      Head Circumference --      Peak Flow --      Pain Score 02/24/21 1808 8  Pain Loc --      Pain Edu? --      Excl. in GC? --    Constitutional: Alert and oriented. Well appearing and in no acute distress. Eyes: Conjunctivae are normal. PERRL. Head: Atraumatic. Nose: No congestion/rhinnorhea. Mouth/Throat: Mucous membranes are moist. Neck: No stridor Cardiovascular: Grossly normal heart sounds.  Good peripheral circulation. Respiratory: Normal respiratory effort.  No retractions. Gastrointestinal: Soft and nontender. No distention. Musculoskeletal: No obvious deformities Neurologic:  Normal speech and language. No gross focal neurologic deficits are appreciated. Skin:  Skin is warm and dry. No rash noted. Psychiatric: Mood and affect are normal. Speech and behavior are normal.  ____________________________________________   LABS (all labs ordered are listed, but only abnormal results are displayed)  Labs Reviewed  BASIC METABOLIC PANEL - Abnormal; Notable for the following components:      Result Value   Glucose, Bld 113 (*)    Creatinine, Ser 1.33 (*)    GFR, Estimated 40 (*)    All other components within normal limits  CBC  TROPONIN I (HIGH SENSITIVITY)  TROPONIN I (HIGH SENSITIVITY)    ____________________________________________  EKG  ED ECG REPORT I, Merwyn KatosEvan K Randall Colden, the attending physician, personally viewed and interpreted this ECG.  Date: 02/24/2021 EKG Time: 1819 Rate: 78 Rhythm: normal sinus rhythm QRS Axis: normal Intervals: normal ST/T Wave abnormalities: normal Narrative Interpretation: no evidence of acute ischemia  ____________________________________________  RADIOLOGY  ED MD interpretation: 2 view chest x-ray shows no evidence of acute abnormalities including no pneumonia, pneumothorax, or widened mediastinum  Official radiology report(s): DG Chest 2 View  Result Date: 02/24/2021 CLINICAL DATA:  Upper chest pain, bilateral arm and neck pain EXAM: CHEST - 2 VIEW COMPARISON:  08/20/2019 FINDINGS: Frontal and lateral views of the chest demonstrate a stable cardiac silhouette. No acute airspace disease, effusion, or pneumothorax. No acute bony abnormalities. IMPRESSION: 1. No acute intrathoracic process. Electronically Signed   By: Sharlet SalinaMichael  Brown M.D.   On: 02/24/2021 19:06    ____________________________________________   PROCEDURES  Procedure(s) performed (including Critical Care):  .1-3 Lead EKG Interpretation Performed by: Merwyn KatosBradler, Mirage Pfefferkorn K, MD Authorized by: Merwyn KatosBradler, Brayden Betters K, MD     Interpretation: normal     ECG rate:  74   ECG rate assessment: normal     Rhythm: sinus rhythm     Ectopy: none     Conduction: normal     ____________________________________________   INITIAL IMPRESSION / ASSESSMENT AND PLAN / ED COURSE  As part of my medical decision making, I reviewed the following data within the electronic medical record, if available:  Nursing notes reviewed and incorporated, Labs reviewed, EKG interpreted, Old chart reviewed, Radiograph reviewed and Notes from prior ED visits reviewed and incorporated        + neck pain + sensation of weakness, paresthesias, and aching + Radiation of pain into the bilateral upper  extremities and anterior chest  ED Workup: Defer C-Spine imaging given negative by NEXUS criteria  Given History, Exam the patient appears to have a cervical radiculopathy.   Patient appears to be low risk for complications or other emergent conditions such as  anginal equivalent, frank cervical instability, arterial dissection, osteomyelitis, epidural abscess, central cord syndrome, c-spine fracture, other spinal emergencies  Rx: NSAIDs, outpatient physical therapy evaluation and recommendation for home exercises in the interim Disposition: Discharge. The patient has been given strict return precautions and understands the need to follow up within 48 hours with their primary care provider  ____________________________________________   FINAL CLINICAL IMPRESSION(S) / ED DIAGNOSES  Final diagnoses:  Chest pain, unspecified type  Paresthesia and pain of both upper extremities  Neck pain     ED Discharge Orders          Ordered    methylPREDNISolone (MEDROL DOSEPAK) 4 MG TBPK tablet        02/24/21 2021             Note:  This document was prepared using Dragon voice recognition software and may include unintentional dictation errors.    Merwyn Katos, MD 02/24/21 [phone]

## 2021-03-03 NOTE — Telephone Encounter (Signed)
Will close encounter. Spoke with patient.   Nothing further is needed at this time.

## 2021-04-17 ENCOUNTER — Other Ambulatory Visit: Payer: Self-pay

## 2021-04-30 ENCOUNTER — Encounter: Payer: Self-pay | Admitting: Emergency Medicine

## 2021-04-30 ENCOUNTER — Ambulatory Visit
Admission: EM | Admit: 2021-04-30 | Discharge: 2021-04-30 | Disposition: A | Discharge status: Home / Self Care | Payer: Medicare Other | Attending: Emergency Medicine | Admitting: Emergency Medicine

## 2021-04-30 DIAGNOSIS — J22 Unspecified acute lower respiratory infection: Secondary | ICD-10-CM

## 2021-04-30 MED ORDER — DOXYCYCLINE HYCLATE 100 MG PO CAPS: 100.0000 mg | ORAL_CAPSULE | Freq: Two times a day (BID) | ORAL | 0 refills | Status: DC

## 2021-04-30 MED ORDER — PREDNISONE 10 MG PO TABS: ORAL_TABLET | ORAL | 0 refills | Status: AC

## 2021-04-30 NOTE — ED Provider Notes (Signed)
CHIEF COMPLAINT:   Chief Complaint  Patient presents with   Cough   Headache   Shortness of Breath     SUBJECTIVE/HPI:   Cough Associated symptoms: headaches and shortness of breath   Headache Associated symptoms: cough   Shortness of Breath Associated symptoms: cough and headaches   A very pleasant 80 y.o.Female presents today with cough, shortness of breath and headache that started 2 weeks ago.  Patient reports a history of COPD. Patient does not report any shortness of breath, chest pain, palpitations, visual changes, weakness, tingling, headache, nausea, vomiting, diarrhea, fever, chills.   has a past medical history of Asthma, Chronic asthmatic bronchitis (HCC), COPD (chronic obstructive pulmonary disease) (HCC), Dyspnea, Environmental allergies, GERD (gastroesophageal reflux disease), Headache, Hyperlipidemia, Osteopenia, and Pneumonia.  ROS:  Review of Systems  Respiratory:  Positive for cough and shortness of breath.   Neurological:  Positive for headaches.  See Subjective/HPI Medications, Allergies and Problem List personally reviewed in Epic today OBJECTIVE:   Vitals:   04/30/21 1139  BP: 120/75  Pulse: 80  Temp: 98.3 F (36.8 C)  SpO2: 94%    Physical Exam   General: Appears well-developed and well-nourished. No acute distress.  HEENT Head: Normocephalic and atraumatic.   Ears: Hearing grossly intact, no drainage or visible deformity.  Nose: No nasal deviation.   Mouth/Throat: No stridor or tracheal deviation.  Non erythematous posterior pharynx noted with clear drainage present.  No white patchy exudate noted. Eyes: Conjunctivae and EOM are normal. No eye drainage or scleral icterus bilaterally.  Neck: Normal range of motion, neck is supple.  Cardiovascular: Normal rate. Regular rhythm; no murmurs, gallops, or rubs.  Pulm/Chest: No respiratory distress.  Bilateral upper lobes CTA.  Bilateral lower lobes rhonchi.  No wheezing or rales. Neurological: Alert  and oriented to person, place, and time.  Skin: Skin is warm and dry.  No rashes, lesions, abrasions or bruising noted to skin.   Psychiatric: Normal mood, affect, behavior, and thought content.   Vital signs and nursing note reviewed.   Patient stable and cooperative with examination. PROCEDURES:    LABS/X-RAYS/EKG/MEDS:   No results found for any visits on 04/30/21.  MEDICAL DECISION MAKING:   Patient presents with cough, shortness of breath and headache that started 2 weeks ago.  Patient reports a history of COPD. Patient does not report any shortness of breath, chest pain, palpitations, visual changes, weakness, tingling, headache, nausea, vomiting, diarrhea, fever, chills.  Given symptoms along with assessment findings, likely lower respiratory tract infection.  Rx prednisone and doxycycline to the patient's preferred pharmacy.  Patient reports that she does have a follow-up appointment scheduled on Wednesday with her PCP.  Advised to keep this appointment.  Advised to rest, fluids, Tylenol, ibuprofen, Mucinex.  Return to clinic for any new onset fever, difficulty breathing, chest pain, symptoms lasting longer than 3 to 4 weeks or bloody sputum.  Patient verbalized understanding and agreed with treatment plan.  Patient stable upon discharge. ASSESSMENT/PLAN:  1. Lower respiratory tract infection - predniSONE (DELTASONE) 10 MG tablet; Take 3 tablets (30 mg total) by mouth daily with breakfast for 2 days, THEN 2 tablets (20 mg total) daily with breakfast for 2 days, THEN 1 tablet (10 mg total) daily with breakfast for 2 days.  Dispense: 12 tablet; Refill: 0 - doxycycline (VIBRAMYCIN) 100 MG capsule; Take 1 capsule (100 mg total) by mouth 2 (two) times daily.  Dispense: 20 capsule; Refill: 0 Instructions about new medications and side effects provided.  Plan:   Discharge Instructions      Keep your appointment for Wednesday with your PCP for follow-up.  Rest, push lots of fluids  (especially water), and utilize supportive care for symptoms. You may take take acetaminophen (Tylenol) every 4-6 hours or ibuprofen every 6-8 hours for muscle pain, joint pain, headaches. Mucinex (guaifenesin) may be taken over the counter for cough as needed and can loosen phlegm. Please read the instructions and take as directed. Saline nasal sprays to rinse congestion can help as well. Warm tea with lemon and honey can sooth sore throat and cough, as can cough drops.  Return to clinic for new-onset fever, difficulty breathing, chest pain, symptoms lasting >3 to 4 weeks, or bloody sputum.          Amalia Greenhouse, FNP 04/30/21 1204

## 2021-04-30 NOTE — Discharge Instructions (Addendum)
Keep your appointment for Wednesday with your PCP for follow-up.  Rest, push lots of fluids (especially water), and utilize supportive care for symptoms. You may take take acetaminophen (Tylenol) every 4-6 hours or ibuprofen every 6-8 hours for muscle pain, joint pain, headaches. Mucinex (guaifenesin) may be taken over the counter for cough as needed and can loosen phlegm. Please read the instructions and take as directed. Saline nasal sprays to rinse congestion can help as well. Warm tea with lemon and honey can sooth sore throat and cough, as can cough drops.  Return to clinic for new-onset fever, difficulty breathing, chest pain, symptoms lasting >3 to 4 weeks, or bloody sputum.

## 2021-04-30 NOTE — ED Triage Notes (Signed)
Pt c/o cough, SOB, and HA. Pt has a hx of COPD

## 2021-06-02 ENCOUNTER — Other Ambulatory Visit: Payer: Self-pay | Admitting: Family Medicine

## 2021-06-02 DIAGNOSIS — Z1231 Encounter for screening mammogram for malignant neoplasm of breast: Secondary | ICD-10-CM

## 2021-06-22 ENCOUNTER — Ambulatory Visit: Payer: Medicare Other | Admitting: Primary Care

## 2021-06-25 ENCOUNTER — Other Ambulatory Visit: Payer: Self-pay

## 2021-06-25 ENCOUNTER — Emergency Department: Payer: Medicare Other

## 2021-06-25 ENCOUNTER — Inpatient Hospital Stay
Admission: EM | Admit: 2021-06-25 | Discharge: 2021-06-27 | DRG: 536 | Disposition: A | Discharge status: Home Health | Payer: Medicare Other | Attending: Internal Medicine | Admitting: Internal Medicine

## 2021-06-25 ENCOUNTER — Encounter: Payer: Self-pay | Admitting: Emergency Medicine

## 2021-06-25 DIAGNOSIS — Z888 Allergy status to other drugs, medicaments and biological substances status: Secondary | ICD-10-CM

## 2021-06-25 DIAGNOSIS — S32512A Fracture of superior rim of left pubis, initial encounter for closed fracture: Secondary | ICD-10-CM

## 2021-06-25 DIAGNOSIS — Z880 Allergy status to penicillin: Secondary | ICD-10-CM

## 2021-06-25 DIAGNOSIS — S32592A Other specified fracture of left pubis, initial encounter for closed fracture: Secondary | ICD-10-CM

## 2021-06-25 DIAGNOSIS — M8580 Other specified disorders of bone density and structure, unspecified site: Secondary | ICD-10-CM | POA: Diagnosis present

## 2021-06-25 DIAGNOSIS — S3282XA Multiple fractures of pelvis without disruption of pelvic ring, initial encounter for closed fracture: Secondary | ICD-10-CM | POA: Diagnosis not present

## 2021-06-25 DIAGNOSIS — S32599A Other specified fracture of unspecified pubis, initial encounter for closed fracture: Secondary | ICD-10-CM | POA: Diagnosis present

## 2021-06-25 DIAGNOSIS — Y92009 Unspecified place in unspecified non-institutional (private) residence as the place of occurrence of the external cause: Secondary | ICD-10-CM

## 2021-06-25 DIAGNOSIS — W010XXA Fall on same level from slipping, tripping and stumbling without subsequent striking against object, initial encounter: Secondary | ICD-10-CM | POA: Diagnosis present

## 2021-06-25 DIAGNOSIS — U071 COVID-19: Secondary | ICD-10-CM

## 2021-06-25 DIAGNOSIS — R110 Nausea: Secondary | ICD-10-CM | POA: Diagnosis present

## 2021-06-25 DIAGNOSIS — Z825 Family history of asthma and other chronic lower respiratory diseases: Secondary | ICD-10-CM

## 2021-06-25 DIAGNOSIS — Z87891 Personal history of nicotine dependence: Secondary | ICD-10-CM

## 2021-06-25 DIAGNOSIS — J438 Other emphysema: Secondary | ICD-10-CM

## 2021-06-25 DIAGNOSIS — G2581 Restless legs syndrome: Secondary | ICD-10-CM | POA: Diagnosis present

## 2021-06-25 DIAGNOSIS — K219 Gastro-esophageal reflux disease without esophagitis: Secondary | ICD-10-CM | POA: Diagnosis present

## 2021-06-25 DIAGNOSIS — G629 Polyneuropathy, unspecified: Secondary | ICD-10-CM | POA: Diagnosis present

## 2021-06-25 DIAGNOSIS — F32A Depression, unspecified: Secondary | ICD-10-CM | POA: Diagnosis present

## 2021-06-25 DIAGNOSIS — Z803 Family history of malignant neoplasm of breast: Secondary | ICD-10-CM

## 2021-06-25 DIAGNOSIS — Z79899 Other long term (current) drug therapy: Secondary | ICD-10-CM

## 2021-06-25 DIAGNOSIS — R0902 Hypoxemia: Secondary | ICD-10-CM | POA: Diagnosis present

## 2021-06-25 DIAGNOSIS — Z7982 Long term (current) use of aspirin: Secondary | ICD-10-CM

## 2021-06-25 DIAGNOSIS — J449 Chronic obstructive pulmonary disease, unspecified: Secondary | ICD-10-CM | POA: Diagnosis present

## 2021-06-25 DIAGNOSIS — Z8616 Personal history of COVID-19: Secondary | ICD-10-CM

## 2021-06-25 LAB — BASIC METABOLIC PANEL
Anion gap: 6 (ref 5–15)
BUN: 14 mg/dL (ref 8–23)
CO2: 24 mmol/L (ref 22–32)
Calcium: 8.6 mg/dL — ABNORMAL LOW (ref 8.9–10.3)
Chloride: 108 mmol/L (ref 98–111)
Creatinine, Ser: 0.5 mg/dL (ref 0.44–1.00)
GFR, Estimated: >60 mL/min (ref >60)
Glucose, Bld: 123 mg/dL — ABNORMAL HIGH (ref 70–99)
Potassium: 4 mmol/L (ref 3.5–5.1)
Sodium: 138 mmol/L (ref 135–145)

## 2021-06-25 LAB — CBC
HCT: 39.1 % (ref 36.0–46.0)
Hemoglobin: 13.2 g/dL (ref 12.0–15.0)
MCH: 31.7 pg (ref 26.0–34.0)
MCHC: 33.8 g/dL (ref 30.0–36.0)
MCV: 94 fL (ref 80.0–100.0)
Platelets: 209 10*3/uL (ref 150–400)
RBC: 4.16 MIL/uL (ref 3.87–5.11)
RDW: 11.9 % (ref 11.5–15.5)
WBC: 8.3 10*3/uL (ref 4.0–10.5)
nRBC: 0 % (ref 0.0–0.2)

## 2021-06-25 LAB — RESP PANEL BY RT-PCR (FLU A&B, COVID) ARPGX2
Influenza A by PCR: NEGATIVE
Influenza B by PCR: NEGATIVE
SARS Coronavirus 2 by RT PCR: POSITIVE — AB

## 2021-06-25 MED ORDER — POLYETHYLENE GLYCOL 3350 17 G PO PACK: 17.0000 g | PACK | Freq: Every day | ORAL | Status: DC | PRN

## 2021-06-25 MED ORDER — ONDANSETRON HCL 4 MG PO TABS: 4.0000 mg | ORAL_TABLET | Freq: Four times a day (QID) | ORAL | Status: DC | PRN

## 2021-06-25 MED ORDER — ACETAMINOPHEN 650 MG RE SUPP: 650.0000 mg | Freq: Four times a day (QID) | RECTAL | Status: DC | PRN

## 2021-06-25 MED ORDER — ONDANSETRON HCL 4 MG/2ML IJ SOLN
4.0000 mg | Freq: Four times a day (QID) | INTRAMUSCULAR | Status: DC | PRN
  Administered 2021-06-25 – 2021-06-26 (×3): 4 mg via INTRAVENOUS
  Filled 2021-06-25 (×3): qty 2

## 2021-06-25 MED ORDER — OXYCODONE HCL 5 MG PO TABS
5.0000 mg | ORAL_TABLET | ORAL | Status: DC | PRN
  Filled 2021-06-25: qty 1

## 2021-06-25 MED ORDER — MORPHINE SULFATE (PF) 4 MG/ML IV SOLN
4.0000 mg | Freq: Once | INTRAVENOUS | Status: AC
  Administered 2021-06-25: 4 mg via INTRAVENOUS
  Filled 2021-06-25: qty 1

## 2021-06-25 MED ORDER — ACETAMINOPHEN 325 MG PO TABS
650.0000 mg | ORAL_TABLET | Freq: Four times a day (QID) | ORAL | Status: DC | PRN
  Administered 2021-06-25 – 2021-06-27 (×5): 650 mg via ORAL
  Filled 2021-06-25 (×6): qty 2

## 2021-06-25 MED ORDER — ONDANSETRON HCL 4 MG/2ML IJ SOLN
4.0000 mg | Freq: Once | INTRAMUSCULAR | Status: AC
  Administered 2021-06-25: 4 mg via INTRAVENOUS
  Filled 2021-06-25: qty 2

## 2021-06-25 MED ORDER — HYDROMORPHONE HCL 1 MG/ML IJ SOLN
0.5000 mg | INTRAMUSCULAR | Status: DC | PRN
  Administered 2021-06-25: 1 mg via INTRAVENOUS
  Filled 2021-06-25: qty 1

## 2021-06-25 MED ORDER — ENOXAPARIN SODIUM 40 MG/0.4ML IJ SOSY
40.0000 mg | PREFILLED_SYRINGE | INTRAMUSCULAR | Status: DC
  Administered 2021-06-25 – 2021-06-26 (×2): 40 mg via SUBCUTANEOUS
  Filled 2021-06-25 (×2): qty 0.4

## 2021-06-25 MED ORDER — PROCHLORPERAZINE EDISYLATE 10 MG/2ML IJ SOLN
5.0000 mg | Freq: Once | INTRAMUSCULAR | Status: AC
  Administered 2021-06-26: 5 mg via INTRAVENOUS
  Filled 2021-06-25: qty 1

## 2021-06-25 NOTE — ED Provider Notes (Signed)
[facility] Provider Note    Event Date/Time   First MD Initiated Contact with Patient 06/25/21 1137     (approximate)   History   Fall, Hip Pain, and Shoulder Pain   HPI  Sandy Eaton is a 81 y.o. female who fell yesterday onto her left side, she reports he lost her balance and tripped.  She complains of pain primarily in her left shoulder and left hip and left groin.  She reports yesterday she was able to ambulate but it is becoming harder and harder or more painful.  Her daughter has to assist her heavily.  No abdominal pain.  No back pain.  No neck pain or head injury.     Physical Exam   Triage Vital Signs: ED Triage Vitals  Enc Vitals Group     BP 06/25/21 0952 121/80     Pulse Rate 06/25/21 0952 87     Resp 06/25/21 0952 17     Temp 06/25/21 0952 97.6 F (36.4 C)     Temp Source 06/25/21 0952 Oral     SpO2 06/25/21 0952 95 %     Weight 06/25/21 0942 65.8 kg (145 lb)     Height 06/25/21 0942 1.6 m (5\' 3" )     Head Circumference --      Peak Flow --      Pain Score 06/25/21 0942 10     Pain Loc --      Pain Edu? --      Excl. in [place]? --     Most recent vital signs: Vitals:   06/25/21 0952  BP: 121/80  Pulse: 87  Resp: 17  Temp: 97.6 F (36.4 C)  SpO2: 95%     General: Awake, no distress.  CV:  Good peripheral perfusion.  Resp:  Normal effort.  Abd:  No distention.  Other:  Musculoskeletal: Patient has significant pain with axial load of the left hip as well as palpation along the left lateral hip.  Complains of pain to the left groin as well.  No vertebral tenderness to palpation  Left shoulder: Good range of motion, mild tenderness along the superior left humeral head, no bony abnormalities palpated   ED Results / Procedures / Treatments   Labs (all labs ordered are listed, but only abnormal results are displayed) Labs Reviewed  BASIC METABOLIC PANEL - Abnormal; Notable for the following components:      Result  Value   Glucose, Bld 123 (*)    Calcium 8.6 (*)    All other components within normal limits  RESP PANEL BY RT-PCR (FLU A&B, COVID) [id number]  CBC     EKG     RADIOLOGY Shoulder x-ray and hip x-ray reviewed by me, no fracture CT pelvis    PROCEDURES:  Critical Care performed:   Procedures   MEDICATIONS ORDERED IN ED: Medications  morphine 4 MG/ML injection 4 mg (4 mg Intravenous Given 06/25/21 1228)  ondansetron (ZOFRAN) injection 4 mg (4 mg Intravenous Given 06/25/21 1228)     IMPRESSION / MDM / ASSESSMENT AND PLAN / ED COURSE  I reviewed the triage vital signs and the nursing notes.   Differential diagnosis includes, but is not limited to, hip fracture, pelvis fracture, hip contusion  X-ray of the hip and shoulder are negative for acute fracture.  However patient has significant pain with axial load on the left hip as well as pain to the groin, somewhat suspicious for pelvic fracture, will send  for CT pelvis  Lab work reviewed and is reassuring, normal CBC, normal BMP  CT pelvis demonstrates subtle left superior pubic ramus fracture, this is the cause of her pain.  Patient is unable to ambulate without significant assistance.  She will require admission for pain control, PT            FINAL CLINICAL IMPRESSION(S) / ED DIAGNOSES   Final diagnoses:  Closed fracture of superior ramus of left pubis, initial encounter ([place])     Rx / DC Orders   ED Discharge Orders     None        Note:  This document was prepared using Dragon voice recognition software and may include unintentional dictation errors.   Lavonia Drafts, MD 06/25/21 1423

## 2021-06-25 NOTE — ED Notes (Signed)
Pt states she tripped & fell on the deck yesterday, landing on her L side. Pt reports L shoulder pain; L hip/pelvis/upper leg pain. Pt denies hitting her head or LOC. Pt denies blood thinner use.

## 2021-06-25 NOTE — ED Notes (Signed)
Light green & lavender top tubes sent to lab. 

## 2021-06-25 NOTE — H&P (Addendum)
History and Physical    Sandy Eaton ZOX:[id number] DOB: 02-May-1941 DOA: 06/25/2021  [place]: [place], [facility]  Patient coming from: Home I have personally briefly [facility]  Chief Complaint: fall   HPI: Sandy Eaton is a 81 y.o. female with medical history significant of depression, arthritis, neuropathy, GERD, hyperlipidemia, COPD/asthma, former smoker presents here for evaluation of fall.  Patient reports that while she was feeding her cats outside and reports she tangled in her own feet and fell and since then she has pain in her left hip and left groin.  This morning when she woke up she was unable to move/walk therefore she came to the ER for further evaluation and management.  Denies head trauma, loss of consciousness, seizure-like episode, lightheadedness, dizziness prior or after fall.  No fever, chills, slurred speech, facial droop, nausea, vomiting, urinary or bowel changes.  She is a former smoker, quit smoking about 12 years ago, no alcohol, licit drug use.  She lives with her daughter at home and independent on daily life activities.  ED Course: Upon arrival to ED: Patient's vital signs stable.  CBC, BMP: Within normal limits.  COVID-19 pending.  X-ray of hip negative for acute findings however due to persistent pain ED physician decided to get the CT pelvis which demonstrated subtle fracture involving left superior pubic ramus and left pubic bone.  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  Aortic atherosclerosis.  EDP talked to orthopedic surgery on-call who recommended conservative management.  [facility] hospitalist consulted for admission for pain management and PT evaluation.  Review of Systems: As per HPI otherwise negative.    Past Medical History:  Diagnosis Date   Asthma    Chronic asthmatic bronchitis (HCC)    COPD (chronic obstructive pulmonary disease) (HCC)    Suspected    Dyspnea    on exertion   Environmental allergies    GERD (gastroesophageal reflux disease)    Headache    migraine without aura, not intractable   Hyperlipidemia    Osteopenia    of lumbar spine   Pneumonia    3 times    Past Surgical History:  Procedure Laterality Date   APPENDECTOMY     COLONOSCOPY WITH PROPOFOL N/A 08/19/2020   Procedure: COLONOSCOPY WITH PROPOFOL;  Surgeon: Regis BillLocklear, Cameron T, MD;  Location: [facility] ENDOSCOPY;  Service: Endoscopy;  Laterality: N/A;  [facility] COVID POSITIVE TEST - ATTACHED TO CHART   ESOPHAGOGASTRODUODENOSCOPY (EGD) WITH PROPOFOL N/A 08/19/2020   Procedure: ESOPHAGOGASTRODUODENOSCOPY (EGD) WITH PROPOFOL;  Surgeon: Regis BillLocklear, Cameron T, MD;  Location: [facility] ENDOSCOPY;  Service: Endoscopy;  Laterality: N/A;   FEMORAL HERNIA REPAIR Right 01/15/2017   Procedure: HERNIA REPAIR FEMORAL;  Surgeon: Nadeen LandauSmith, Jarvis Wilton, MD;  Location: [facility] ORS;  Service: General;  Laterality: Right;   ROTATOR CUFF REPAIR Right    TONSILLECTOMY     VAGINAL HYSTERECTOMY     1960's     reports that she quit smoking about 12 years ago. Her smoking use included cigarettes. She has a 15.00 pack-year smoking history. She has never used smokeless tobacco. She reports that she does not drink alcohol and does not use drugs.  Allergies  Allergen Reactions   Augmentin [Amoxicillin-Pot Clavulanate] Nausea And Vomiting    Has patient had a PCN reaction causing immediate rash, facial/tongue/throat swelling, SOB or lightheadedness with hypotension: No Has patient had a PCN reaction causing severe rash involving mucus membranes or skin necrosis:  No Has patient had a PCN reaction that required hospitalization: No Has patient had a PCN reaction occurring within the last 10 years: No If all of the above answers are "NO", then may proceed with Cephalosporin use.    Escitalopram Oxalate Diarrhea   Valium [Diazepam] Other (See Comments)    Reaction:  Hallucinations    Wixela Inhub  [Fluticasone-Salmeterol]     Thrush      Family History  Problem Relation Age of Onset   COPD Father    Asthma Daughter    Asthma Grandchild    Cancer Daughter        breast   Breast cancer Daughter    Other Mother        MVA    Prior to Admission medications   Medication Sig Start Date End Date Taking? Authorizing Provider  acetaminophen (TYLENOL) 500 MG tablet Take 1,000 mg by mouth 2 (two) times daily as needed for mild pain or headache.    [provider]  albuterol (PROVENTIL) (2.5 MG/3ML) 0.083% nebulizer solution Take 3 mLs (2.5 mg total) by nebulization every 6 (six) hours as needed for wheezing or shortness of breath. 06/11/18   Salary, Evelena Asa, MD  Albuterol Sulfate (PROAIR RESPICLICK) 108 (90 Base) MCG/ACT AEPB Inhale 2 puffs into the lungs every 4 (four) hours as needed. 09/27/20   Glenford Bayley, NP  aspirin EC 81 MG tablet Take 81 mg by mouth daily.    [provider]  benzonatate (TESSALON) 100 MG capsule Take 1 capsule by mouth three times daily as needed for cough 11/25/20   Parrett, Tammy S, NP  calcium-vitamin D (OSCAL WITH D) 500-200 MG-UNIT tablet Take 1 tablet by mouth.    [provider]  diphenhydrAMINE (BENADRYL) 25 MG tablet Take 25 mg by mouth at bedtime as needed.    [provider]  doxycycline (VIBRAMYCIN) 100 MG capsule Take 1 capsule (100 mg total) by mouth 2 (two) times daily. 04/30/21   Boddu, Belenda Cruise, FNP  fluticasone (FLONASE) 50 MCG/ACT nasal spray Place 1 spray into both nostrils daily as needed for rhinitis.     [provider]  gabapentin (NEURONTIN) 300 MG capsule Take 300 mg by mouth 3 (three) times daily. 05/12/19   [provider]  montelukast (SINGULAIR) 10 MG tablet Take 1 tablet (10 mg total) by mouth at bedtime. 09/27/20   Glenford Bayley, NP  omeprazole (PRILOSEC) 40 MG capsule Take 40 mg by mouth daily.     [provider]  POTASSIUM CHLORIDE PO Take by mouth. Patient  not taking: Reported on 09/27/2020    [provider]  sertraline (ZOLOFT) 50 MG tablet Take 50 mg by mouth daily. 07/23/19   [provider]  Tiotropium Bromide Monohydrate (SPIRIVA RESPIMAT) 1.25 MCG/ACT AERS Inhale 2 puffs into the lungs daily. 09/27/20   G[place]ford Bayley, NP  Tiotropium Bromide Monohydrate (SPIRIVA RESPIMAT) 1.25 MCG/ACT AERS Inhale 2 puffs into the lungs daily. 10/07/20   Glenford Bayley, NP    Physical Exam: Vitals:   06/25/21 6599 06/25/21 0952 06/25/21 1433  BP:  121/80 110/70  Pulse:  87 67  Resp:  17 18  Temp:  97.6 F (36.4 C) 97.6 F (36.4 C)  TempSrc:  Oral Oral  SpO2:  95% 97%  Weight: 65.8 kg    Height: 5\' 3"  (1.6 m)      Constitutional: NAD, calm, comfortable, on room air, communicating well, daughter at the bedside. Eyes: PERRL, lids  and conjunctivae normal ENMT: Mucous membranes are moist. Posterior pharynx clear of any exudate or lesions.Normal dentition.  Neck: normal, supple, no masses, no thyromegaly Respiratory: clear to auscultation bilaterally, no wheezing, no crackles. Normal respiratory effort. No accessory muscle use.  Cardiovascular: Regular rate and rhythm, no murmurs / rubs / gallops. No extremity edema. 2+ pedal pulses. No carotid bruits.  Abdomen: Lower abdominal tenderness positive.  No guarding, no rigidity.  Bowel sounds positive  musculoskeletal: no clubbing / cyanosis.  Unable to move left leg due to pain Skin: Bruise on left wrist Neurologic: CN 2-12 grossly intact. Sensation intact, DTR normal.  Strength 5 out of 5 in all 3 extremities except left lower extremity which is 2-3 out of 5 due to pain  psychiatric: Normal judgment and insight. Alert and oriented x 3. Normal mood.    Labs on Admission: I have personally reviewed following labs and imaging studies  CBC: Recent Labs  Lab 06/25/21 1221  WBC 8.3  HGB 13.2  HCT 39.1  MCV 94.0  PLT 209   Basic Metabolic Panel: Recent Labs  Lab  06/25/21 1221  NA 138  K 4.0  CL 108  CO2 24  GLUCOSE 123*  BUN 14  CREATININE 0.50  CALCIUM 8.6*   GFR: Estimated Creatinine Clearance: 51.2 mL/min (by C-G formula based on SCr of 0.5 mg/dL). Liver Function Tests: No results for input(s): AST, ALT, ALKPHOS, BILITOT, PROT, ALBUMIN in the last 168 hours. No results for input(s): LIPASE, AMYLASE in the last 168 hours. No results for input(s): AMMONIA in the last 168 hours. Coagulation Profile: No results for input(s): INR, PROTIME in the last 168 hours. Cardiac Enzymes: No results for input(s): CKTOTAL, CKMB, CKMBINDEX, TROPONINI in the last 168 hours. BNP (last 3 results) No results for input(s): PROBNP in the last 8760 hours. HbA1C: No results for input(s): HGBA1C in the last 72 hours. CBG: No results for input(s): GLUCAP in the last 168 hours. Lipid Profile: No results for input(s): CHOL, HDL, LDLCALC, TRIG, CHOLHDL, LDLDIRECT in the last 72 hours. Thyroid Function Tests: No results for input(s): TSH, T4TOTAL, FREET4, T3FREE, THYROIDAB in the last 72 hours. Anemia Panel: No results for input(s): VITAMINB12, FOLATE, FERRITIN, TIBC, IRON, RETICCTPCT in the last 72 hours. Urine analysis:    Component Value Date/Time   COLORURINE YELLOW (A) 11/01/2016 1540   APPEARANCEUR CLEAR (A) 11/01/2016 1540   LABSPEC 1.014 11/01/2016 1540   PHURINE 5.0 11/01/2016 1540   GLUCOSEU NEGATIVE 11/01/2016 1540   HGBUR NEGATIVE 11/01/2016 1540   BILIRUBINUR NEGATIVE 11/01/2016 1540   KETONESUR NEGATIVE 11/01/2016 1540   PROTEINUR NEGATIVE 11/01/2016 1540   NITRITE NEGATIVE 11/01/2016 1540   LEUKOCYTESUR NEGATIVE 11/01/2016 1540    Radiological Exams on Admission: CT PELVIS WO CONTRAST  Result Date: 06/25/2021 CLINICAL DATA:  Patient fell yesterday with persistent left hip pain and difficulty walking. EXAM: CT PELVIS WITHOUT CONTRAST TECHNIQUE: Multidetector CT imaging of the pelvis was performed following the standard protocol without  intravenous contrast. COMPARISON:  None. FINDINGS: Urinary Tract:  No abnormality visualized. Bowel:  Unremarkable visualized pelvic bowel loops. Vascular/Lymphatic: Atherosclerotic calcification noted distal aorta. No pelvic sidewall lymphadenopathy. Reproductive:  Uterus surgically absent.  There is no adnexal mass. Other:  No intraperitoneal free fluid. Musculoskeletal: No left femoral neck fracture. The patient does have an extremely subtle fracture involving the left superior pubic ramus and left pubic bone with superior cortical off step visible in the pubic bone on coronal 39/4 in lateral cortical off step  visible on coronal 43/4. Despite the evidence of cortical disruption, a discrete fracture line is not visible. Soft tissue images do show some stranding around the left superior pubic ramus and pubic bone compatible with edema/hemorrhage. Subcutaneous edema noted lateral to the left greater trochanter. No soft tissue hematoma evident in the region of the left hip. No evidence for sacral fracture. Left-sided L5 pars defect evident. Advanced degenerative disc disease noted L5-S1. IMPRESSION: 1. Extremely subtle fracture involving the left superior pubic ramus and left pubic bone with superior and lateral cortical off step off. Despite the evidence of cortical disruption, a discrete fracture line is not visible. There is some stranding around the left superior pubic ramus and pubic bone compatible with edema/hemorrhage. 2. No left femoral neck fracture. There is soft tissue edema/contusion in the subcutaneous fat lateral to the left hip. No soft tissue or intramuscular hematoma. 3. 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 4. Aortic Atherosclerosis (ICD10-I70.0). Electronically Signed   By: Kennith CenterEric  Mansell M.D.   On: 06/25/2021 13:06   DG Shoulder Left  Result Date: 06/25/2021 CLINICAL DATA:  Tripped and fell yesterday.  Left shoulder pain. EXAM: LEFT SHOULDER - 2+ VIEW  COMPARISON:  None. FINDINGS: No fracture or bone lesion. Glenohumeral and AC joints are normally aligned. Skeletal structures are demineralized. Soft tissues are unremarkable. IMPRESSION: No fracture or dislocation. Electronically Signed   By: Amie Portlandavid  Ormond M.D.   On: 06/25/2021 10:15   DG Hip Unilat W or Wo Pelvis 2-3 Views Left  Result Date: 06/25/2021 CLINICAL DATA:  Tripped and fell yesterday.  Left hip pain. EXAM: DG HIP (WITH OR WITHOUT PELVIS) 2-3V LEFT COMPARISON:  None. FINDINGS: No fracture or bone lesion. Hip joints, SI joints and symphysis pubis are normally spaced and aligned. Soft tissues are unremarkable. IMPRESSION: No fracture or dislocation. Electronically Signed   By: Amie Portlandavid  Ormond M.D.   On: 06/25/2021 10:16     Assessment/Plan Principal Problem:   Pubic ramus fracture (HCC) Active Problems:   COPD (chronic obstructive pulmonary disease) (HCC)   GERD (gastroesophageal reflux disease)   Left superior pubic ramus and left pubic bone fracture: Fall: -X-ray left shoulder and left hip: Negative for fracture however CT pelvis without contrast showed left superior pubic ramus and left pubic bone fracture.  There is some stranding around the left superior pubic ramus and pubic bone compatible with edema/hemorrhage.   -EDP consulted orthopedic surgery recommended conservative management -Admit patient under observation -Tylenol, oxycodone, hydromorphone as needed for mild, moderate, severe pain respectively -Consult physical therapy  COPD: Stable -Maintaining oxygen saturation on room air -Continue home inhalers and Singulair  Depression: Stable on Zoloft  GERD: Continue PPI  Arthritis/neuropathy/restless leg syndrome: Continue gabapentin  ADDENDUM: patient tested positive for Covid, however she is asymptomatic and maintaining O2 sats on RA. No respiratory symptoms.  DVT prophylaxis: Lovenox/SCD Code Status: Full code confirmed with patient's daughter at bedside Family  Communication: Patient's daughter present at bedside.  Plan of care discussed with patient in length and she verbalized understanding and agreed with it. Disposition Plan: Home/SNF-based on PT evaluation Consults called: Orthopedic surgery by EDP Admission status: Observation   Ollen Bowlinka R Ensley Blas MD [facility]  If 7PM-7AM, please contact night-coverage [email or web address]  06/25/2021, 2:41 PM

## 2021-06-25 NOTE — ED Triage Notes (Signed)
Pt reports tripped and fell yesterday and continues to have pain to her left hip and groin and left shoulder. Pt able to lift left arm but reports really hard to stand or walk because her hip

## 2021-06-25 NOTE — ED Notes (Signed)
Informed RN bed assgined

## 2021-06-25 NOTE — Consult Note (Addendum)
ORTHOPAEDIC CONSULTATION  REQUESTING PHYSICIAN: Pahwani, Rinka R, MD  Chief Complaint:   Left hOllen Bowlip/groin pain.  History of Present Illness: Sandy Eaton is a 81 y.o. female with multiple medical problems including COPD, chronic bronchitis, asthma, gastroesophageal reflux disease, hyperlipidemia, and osteopenia who lives independently with her daughter, and normally does not use any assistive devices.  Apparently, the patient was trying to feed some cats on her back deck yesterday afternoon when "my feet got tangled up" and she fell hard onto her left side.  Initially, she did not seek treatment, but because of continued pain and difficulty weightbearing on her left side this morning, she was brought to the emergency room.  Initial x-rays of the left hip and pelvis were unremarkable, but a CT scan of the pelvis demonstrated a nondisplaced fracture of the left superior pubic ramus extending into the pubis.  The patient has been admitted for pain control and possible probable rehab placement.  The patient denies striking her head or losing consciousness from the fall.  However, she does note some left shoulder soreness, but x-rays of the left shoulder in the emergency room were unremarkable for fracture.  She denies any lightheadedness, dizziness, chest pain, or other symptoms which may have precipitated her fall.  Past Medical History:  Diagnosis Date   Asthma    Chronic asthmatic bronchitis (HCC)    COPD (chronic obstructive pulmonary disease) (HCC)    Suspected   Dyspnea    on exertion   Environmental allergies    GERD (gastroesophageal reflux disease)    Headache    migraine without aura, not intractable   Hyperlipidemia    Osteopenia    of lumbar spine   Pneumonia    3 times   Past Surgical History:  Procedure Laterality Date   APPENDECTOMY     COLONOSCOPY WITH PROPOFOL N/A 08/19/2020   Procedure: COLONOSCOPY WITH  PROPOFOL;  Surgeon: Regis BillLocklear, Cameron T, MD;  Location: [facility] END[facility]COPY;  Service: Endoscopy;  Laterality: N/A;  [facility] COVID POSITIVE TEST - ATTACHED TO CHART   ESOPHAGOGASTRODUODENOSCOPY (EGD) WITH PROPOFOL N/A 08/19/2020   Procedure: ESOPHAGOGASTRODUODENOSCOPY (EGD) WITH PROPOFOL;  Surgeon: Regis BillLocklear, Cameron T, MD;  Location: [facility] ENDOSCOPY;  Service: Endoscopy;  Laterality: N/A;   FEMORAL HERNIA REPAIR Right 01/15/2017   Procedure: HERNIA REPAIR FEMORAL;  Surgeon: Nadeen LandauSmith, Jarvis Wilton, MD;  Location: [facility] ORS;  Service: General;  Laterality: Right;   ROTATOR CUFF REPAIR Right    TONSILLECTOMY     VAGINAL HYSTERECTOMY     1960's   Social History   Socioeconomic History   Marital status: Single    Spouse name: Not on file   Number of children: Not on file   Years of education: Not on file   Highest education level: Not on file  Occupational History   Not on file  Tobacco Use   Smoking status: Former    Packs/day: 0.50    Years: 30.00    Pack years: 15.00    Types: Cigarettes    Quit date: 02/24/2009    Years since quitting: 12.3   Smokeless tobacco: Never  Vaping Use   Vaping Use: Never used  Substance and Sexual Activity   Alcohol use: No   Drug use: No   Sexual activity: Not on file  Other Topics Concern   Not on file  Social History Narrative   Not on file   Social Determinants of Health   Financial Resource Strain: Not on file  Food Insecurity: Not on  file  Transportation Needs: Not on file  Physical Activity: Not on file  Stress: Not on file  Social Connections: Not on file   Family History  Problem Relation Age of Onset   COPD Father    Asthma Daughter    Asthma Grandchild    Cancer Daughter        breast   Breast cancer Daughter    Other Mother        MVA   Allergies  Allergen Reactions   Augmentin [Amoxicillin-Pot Clavulanate] Nausea And Vomiting    Has patient had a PCN reaction causing immediate rash, facial/tongue/throat swelling, SOB or  lightheadedness with hypotension: No Has patient had a PCN reaction causing severe rash involving mucus membranes or skin necrosis: No Has patient had a PCN reaction that required hospitalization: No Has patient had a PCN reaction occurring within the last 10 years: No If all of the above answers are "NO", then may proceed with Cephalosporin use.    Escitalopram Oxalate Diarrhea   Valium [Diazepam] Other (See Comments)    Reaction:  Hallucinations    Wixela Inhub [Fluticasone-Salmeterol]     Thrush     Prior to Admission medications   Medication Sig Start Date End Date Taking? Authorizing Provider  acetaminophen (TYLENOL) 500 MG tablet Take 1,000 mg by mouth 2 (two) times daily as needed for mild pain or headache.    [provider]  albuterol (PROVENTIL) (2.5 MG/3ML) 0.083% nebulizer solution Take 3 mLs (2.5 mg total) by nebulization every 6 (six) hours as needed for wheezing or shortness of breath. 06/11/18   Salary, Evelena Asa, MD  Albuterol Sulfate (PROAIR RESPICLICK) 108 (90 Base) MCG/ACT AEPB Inhale 2 puffs into the lungs every 4 (four) hours as needed. 09/27/20   Glenford Bayley, NP  aspirin EC 81 MG tablet Take 81 mg by mouth daily.    [provider]  benzonatate (TESSALON) 100 MG capsule Take 1 capsule by mouth three times daily as needed for cough 11/25/20   Parrett, Tammy S, NP  calcium-vitamin D (OSCAL WITH D) 500-200 MG-UNIT tablet Take 1 tablet by mouth.    [provider]  diphenhydrAMINE (BENADRYL) 25 MG tablet Take 25 mg by mouth at bedtime as needed.    [provider]  doxycycline (VIBRAMYCIN) 100 MG capsule Take 1 capsule (100 mg total) by mouth 2 (two) times daily. 04/30/21   Boddu, Belenda Cruise, FNP  fluticasone (FLONASE) 50 MCG/ACT nasal spray Place 1 spray into both nostrils daily as needed for rhinitis.     [provider]  gabapentin (NEURONTIN) 300 MG capsule Take 300 mg by mouth 3 (three) times daily. 05/12/19   [provider]  montelukast (SINGULAIR) 10 MG tablet Take 1 tablet (10 mg total) by mouth at bedtime. 09/27/20   Glenford Bayley, NP  omeprazole (PRILOSEC) 40 MG capsule Take 40 mg by mouth daily.     [provider]  POTASSIUM CHLORIDE PO Take by mouth. Patient not taking: Reported on 09/27/2020    [provider]  sertraline (ZOLOFT) 50 MG tablet Take 50 mg by mouth daily. 07/23/19   [provider]  Tiotropium Bromide Monohydrate (SPIRIVA RESPIMAT) 1.25 MCG/ACT AERS Inhale 2 puffs into the lungs daily. 09/27/20   Glenford Bayley, NP  Tiotropium Bromide Monohydrate (SPIRIVA RESPIMAT) 1.25 MCG/ACT AERS Inhale 2 puffs into the lungs daily. 10/07/20   Glenford Bayley, NP   CT PELVIS WO CONTRAST  Result Date: 06/25/2021 CLINICAL DATA:  Patient fell yesterday with persistent left hip pain and difficulty walking. EXAM: CT PELVIS WITHOUT CONTRAST TECHNIQUE: Multidetector CT imaging of the pelvis was performed following the standard protocol without intravenous contrast. COMPARISON:  None. FINDINGS: Urinary Tract:  No abnormality visualized. Bowel:  Unremarkable visualized pelvic bowel loops. Vascular/Lymphatic: Atherosclerotic calcification noted distal aorta. No pelvic sidewall lymphadenopathy. Reproductive:  Uterus surgically absent.  There is no adnexal mass. Other:  No intraperitoneal free fluid. Musculoskeletal: No left femoral neck fracture. The patient does have an extremely subtle fracture involving the left superior pubic ramus and left pubic bone with superior cortical off step visible in the pubic bone on coronal 39/4 in lateral cortical off step visible on coronal 43/4. Despite the evidence of cortical disruption, a discrete fracture line is not visible. Soft tissue images do show some stranding around the left superior pubic ramus and pubic bone compatible with edema/hemorrhage. Subcutaneous edema noted lateral to the left greater trochanter. No soft tissue hematoma  evident in the region of the left hip. No evidence for sacral fracture. Left-sided L5 pars defect evident. Advanced degenerative disc disease noted L5-S1. IMPRESSION: 1. Extremely subtle fracture involving the left superior pubic ramus and left pubic bone with superior and lateral cortical off step off. Despite the evidence of cortical disruption, a discrete fracture line is not visible. There is some stranding around the left superior pubic ramus and pubic bone compatible with edema/hemorrhage. 2. No left femoral neck fracture. There is soft tissue edema/contusion in the subcutaneous fat lateral to the left hip. No soft tissue or intramuscular hematoma. 3. 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 4. Aortic Atherosclerosis (ICD10-I70.0). Electronically Signed   By: Kennith CenterEric  Mansell M.D.   On: 06/25/2021 13:06   DG Shoulder Left  Result Date: 06/25/2021 CLINICAL DATA:  Tripped and fell yesterday.  Left shoulder pain. EXAM: LEFT SHOULDER - 2+ VIEW COMPARISON:  None. FINDINGS: No fracture or bone lesion. Glenohumeral and AC joints are normally aligned. Skeletal structures are demineralized. Soft tissues are unremarkable. IMPRESSION: No fracture or dislocation. Electronically Signed   By: Amie Portlandavid  Ormond M.D.   On: 06/25/2021 10:15   DG Hip Unilat W or Wo Pelvis 2-3 Views Left  Result Date: 06/25/2021 CLINICAL DATA:  Tripped and fell yesterday.  Left hip pain. EXAM: DG HIP (WITH OR WITHOUT PELVIS) 2-3V LEFT COMPARISON:  None. FINDINGS: No fracture or bone lesion. Hip joints, SI joints and symphysis pubis are normally spaced and aligned. Soft tissues are unremarkable. IMPRESSION: No fracture or dislocation. Electronically Signed   By: Amie Portlandavid  Ormond M.D.   On: 06/25/2021 10:16    Positive ROS: All other systems have been reviewed and were otherwise negative with the exception of those mentioned in the HPI and as above.  Physical Exam: General:  Alert, no acute distress Psychiatric:   Patient is competent for consent with normal mood and affect   Cardiovascular:  No pedal edema Respiratory:  No wheezing, non-labored breathing GI:  Abdomen is soft and non-tender Skin:  No lesions in the area of chief complaint Neurologic:  Sensation intact distally Lymphatic:  No axillary or cervical lymphadenopathy  Orthopedic Exam:  Orthopedic examination is limited to the left hip and lower extremity.  The left lower extremity is symmetrically aligned as compared to the right lower extremity.  Skin inspection of the left hip/thigh region demonstrates a very small area of ecchymosis over the lateral aspect of the hip, but otherwise is unremarkable.  No swelling, erythema, abrasions,  or other skin abnormalities are identified.  She has mild-moderate tenderness to palpation over the anterior and lateral aspects of the hip.  This pain is reproduced with attempted active left hip flexion, but she is able to tolerate gentle logrolling of the hip.  She is neurovascularly intact to the left lower extremity and foot.  X-rays:  Plain radiographs of the pelvis and left hip, as well as a CT scan, are available for review and have been reviewed by myself.  Although the plain radiographs were read as negative for any fracture, the CT scan demonstrated an essentially nondisplaced left superior pubic ramus fracture extending into the pubis.  This study also demonstrated a pars defect at L5 on the left as well as significant degenerative changes of the L5-S1 disc space.  No other fractures, lytic lesions, or significant degenerative changes were identified.  Assessment: Nondisplaced left superior pubic ramus fracture.  Plan: The treatment options are discussed with the patient and her daughter who is at the bedside.  The patient and her daughter are reassured that this injury does not require surgical intervention.  She will be admitted at this time for pain control.  She may receive formal physical therapy  for mobilization, weightbearing as tolerated on the left lower extremity and using a walker for balance and support.  She may receive pain medication as deemed appropriate medically.  She may require rehab placement for several weeks, depending on her progression with physical therapy.  Thank you for asking me to participate in the care of this most pleasant yet unfortunate woman.  I will be happy to follow her with you.   Maryagnes Amos, MD  Beeper #:  [phone]  06/25/2021 3:53 PM

## 2021-06-26 ENCOUNTER — Observation Stay: Payer: Medicare Other

## 2021-06-26 ENCOUNTER — Other Ambulatory Visit: Payer: Self-pay

## 2021-06-26 DIAGNOSIS — S3282XS Multiple fractures of pelvis without disruption of pelvic ring, sequela: Secondary | ICD-10-CM

## 2021-06-26 DIAGNOSIS — Z880 Allergy status to penicillin: Secondary | ICD-10-CM | POA: Diagnosis not present

## 2021-06-26 DIAGNOSIS — R110 Nausea: Secondary | ICD-10-CM

## 2021-06-26 DIAGNOSIS — G2581 Restless legs syndrome: Secondary | ICD-10-CM | POA: Diagnosis present

## 2021-06-26 DIAGNOSIS — J438 Other emphysema: Secondary | ICD-10-CM | POA: Diagnosis not present

## 2021-06-26 DIAGNOSIS — G629 Polyneuropathy, unspecified: Secondary | ICD-10-CM

## 2021-06-26 DIAGNOSIS — R0902 Hypoxemia: Secondary | ICD-10-CM

## 2021-06-26 DIAGNOSIS — W010XXA Fall on same level from slipping, tripping and stumbling without subsequent striking against object, initial encounter: Secondary | ICD-10-CM | POA: Diagnosis present

## 2021-06-26 DIAGNOSIS — F32A Depression, unspecified: Secondary | ICD-10-CM | POA: Diagnosis present

## 2021-06-26 DIAGNOSIS — S3282XA Multiple fractures of pelvis without disruption of pelvic ring, initial encounter for closed fracture: Secondary | ICD-10-CM

## 2021-06-26 DIAGNOSIS — U071 COVID-19: Secondary | ICD-10-CM

## 2021-06-26 DIAGNOSIS — K219 Gastro-esophageal reflux disease without esophagitis: Secondary | ICD-10-CM | POA: Diagnosis present

## 2021-06-26 DIAGNOSIS — Z87891 Personal history of nicotine dependence: Secondary | ICD-10-CM | POA: Diagnosis not present

## 2021-06-26 DIAGNOSIS — Z8616 Personal history of COVID-19: Secondary | ICD-10-CM | POA: Diagnosis not present

## 2021-06-26 DIAGNOSIS — M8580 Other specified disorders of bone density and structure, unspecified site: Secondary | ICD-10-CM | POA: Diagnosis present

## 2021-06-26 DIAGNOSIS — J449 Chronic obstructive pulmonary disease, unspecified: Secondary | ICD-10-CM | POA: Diagnosis present

## 2021-06-26 DIAGNOSIS — Z825 Family history of asthma and other chronic lower respiratory diseases: Secondary | ICD-10-CM | POA: Diagnosis not present

## 2021-06-26 DIAGNOSIS — S32599A Other specified fracture of unspecified pubis, initial encounter for closed fracture: Secondary | ICD-10-CM | POA: Diagnosis present

## 2021-06-26 DIAGNOSIS — Z79899 Other long term (current) drug therapy: Secondary | ICD-10-CM | POA: Diagnosis not present

## 2021-06-26 DIAGNOSIS — Z888 Allergy status to other drugs, medicaments and biological substances status: Secondary | ICD-10-CM | POA: Diagnosis not present

## 2021-06-26 DIAGNOSIS — Y92009 Unspecified place in unspecified non-institutional (private) residence as the place of occurrence of the external cause: Secondary | ICD-10-CM | POA: Diagnosis not present

## 2021-06-26 DIAGNOSIS — Z803 Family history of malignant neoplasm of breast: Secondary | ICD-10-CM | POA: Diagnosis not present

## 2021-06-26 DIAGNOSIS — Z7982 Long term (current) use of aspirin: Secondary | ICD-10-CM | POA: Diagnosis not present

## 2021-06-26 LAB — BASIC METABOLIC PANEL
Anion gap: 7 (ref 5–15)
BUN: 18 mg/dL (ref 8–23)
CO2: 26 mmol/L (ref 22–32)
Calcium: 8.7 mg/dL — ABNORMAL LOW (ref 8.9–10.3)
Chloride: 107 mmol/L (ref 98–111)
Creatinine, Ser: 0.54 mg/dL (ref 0.44–1.00)
GFR, Estimated: >60 mL/min (ref >60)
Glucose, Bld: 175 mg/dL — ABNORMAL HIGH (ref 70–99)
Potassium: 3.9 mmol/L (ref 3.5–5.1)
Sodium: 140 mmol/L (ref 135–145)

## 2021-06-26 MED ORDER — ALBUTEROL SULFATE (2.5 MG/3ML) 0.083% IN NEBU: 2.5000 mg | INHALATION_SOLUTION | RESPIRATORY_TRACT | Status: DC | PRN

## 2021-06-26 MED ORDER — TIOTROPIUM BROMIDE MONOHYDRATE 18 MCG IN CAPS
1.0000 | ORAL_CAPSULE | Freq: Every day | RESPIRATORY_TRACT | Status: DC
  Administered 2021-06-26 – 2021-06-27 (×2): 18 ug via RESPIRATORY_TRACT
  Filled 2021-06-26: qty 5

## 2021-06-26 MED ORDER — MONTELUKAST SODIUM 10 MG PO TABS
10.0000 mg | ORAL_TABLET | Freq: Every day | ORAL | Status: DC
  Administered 2021-06-26: 10 mg via ORAL
  Filled 2021-06-26: qty 1

## 2021-06-26 MED ORDER — VITAMIN B-12 1000 MCG PO TABS
1000.0000 ug | ORAL_TABLET | Freq: Every day | ORAL | Status: DC
  Administered 2021-06-26 – 2021-06-27 (×2): 1000 ug via ORAL
  Filled 2021-06-26 (×2): qty 1

## 2021-06-26 MED ORDER — SERTRALINE HCL 50 MG PO TABS
50.0000 mg | ORAL_TABLET | Freq: Every day | ORAL | Status: DC
  Administered 2021-06-26 – 2021-06-27 (×2): 50 mg via ORAL
  Filled 2021-06-26 (×2): qty 1

## 2021-06-26 MED ORDER — MOMETASONE FURO-FORMOTEROL FUM 200-5 MCG/ACT IN AERO
2.0000 | INHALATION_SPRAY | Freq: Two times a day (BID) | RESPIRATORY_TRACT | Status: DC
  Administered 2021-06-26 – 2021-06-27 (×3): 2 via RESPIRATORY_TRACT
  Filled 2021-06-26: qty 8.8

## 2021-06-26 MED ORDER — GABAPENTIN 300 MG PO CAPS
300.0000 mg | ORAL_CAPSULE | Freq: Two times a day (BID) | ORAL | Status: DC
  Administered 2021-06-26 – 2021-06-27 (×3): 300 mg via ORAL
  Filled 2021-06-26 (×3): qty 1

## 2021-06-26 MED ORDER — ASPIRIN EC 81 MG PO TBEC
81.0000 mg | DELAYED_RELEASE_TABLET | Freq: Every day | ORAL | Status: DC
  Administered 2021-06-26 – 2021-06-27 (×2): 81 mg via ORAL
  Filled 2021-06-26 (×2): qty 1

## 2021-06-26 MED ORDER — VITAMIN D 25 MCG (1000 UNIT) PO TABS
1000.0000 [IU] | ORAL_TABLET | Freq: Every day | ORAL | Status: DC
  Administered 2021-06-26 – 2021-06-27 (×2): 1000 [IU] via ORAL
  Filled 2021-06-26 (×2): qty 1

## 2021-06-26 MED ORDER — ALBUTEROL SULFATE HFA 108 (90 BASE) MCG/ACT IN AERS
2.0000 | INHALATION_SPRAY | Freq: Four times a day (QID) | RESPIRATORY_TRACT | Status: DC
  Administered 2021-06-26 – 2021-06-27 (×4): 2 via RESPIRATORY_TRACT
  Filled 2021-06-26: qty 6.7

## 2021-06-26 MED ORDER — IPRATROPIUM-ALBUTEROL 0.5-2.5 (3) MG/3ML IN SOLN: 3.0000 mL | Freq: Four times a day (QID) | RESPIRATORY_TRACT | Status: DC | PRN

## 2021-06-26 NOTE — Plan of Care (Signed)

## 2021-06-26 NOTE — Progress Notes (Signed)
Met with the patient at the bedside to discuss Needs and plan She lives at home with her daughter She does not have any DME at home and needs a RW and a 3 in 1, She also will need HH PT I contacted Lowella Grip at [facility] requesting them to accept the patient, waiting on a call back, her daughter provides transportation She can afford her medication No additional needs

## 2021-06-26 NOTE — Evaluation (Signed)
Physical Therapy Evaluation Patient Details Name: Sandy Eaton MRN: [id number] DOB: 14-Feb-1941 Today's Date: 06/26/2021  History of Present Illness  Pt is an 81 y.o. female with medical history significant of depression, arthritis, neuropathy, GERD, hyperlipidemia, COPD/asthma, former smoker presents here for evaluation of fall. MD assessment includes nondisplaced left superior pubic ramus fracture and recent Covid infection, per patient "around Christmas time".   Clinical Impression  Pt was pleasant and motivated to participate during the session and put forth good effort throughout. Pt found on room air with HR WNL and SpO2 91% at rest.  Pt required extra time and effort with mobility tasks and cues for proper sequencing for L pelvic pain control but no physical assistance. After ambulation pt's SpO2 increased to 94% with HR WNL.  Pt's amb distance with limited to around 5 feet this session with pt reporting nausea being a significant contributor to her limited activity tolerance. Nursing aware and nausea medication was given via IV by nursing prior to ambulation.  Pt is expected to make good progress towards goals once nausea is resolved and has 24/7 support at home. Pt will benefit from HHPT upon discharge to safely address deficits listed in patient problem list for decreased caregiver assistance and eventual return to PLOF.          Recommendations for follow up therapy are one component of a multi-disciplinary discharge planning process, led by the attending physician.  Recommendations may be updated based on patient status, additional functional criteria and insurance authorization.  Follow Up Recommendations Home health PT    Assistance Recommended at Discharge Frequent or constant Supervision/Assistance  Patient can return home with the following  A little help with walking and/or transfers;A little help with bathing/dressing/bathroom;Assistance with cooking/housework;Assist for  transportation;Help with stairs or ramp for entrance    Equipment Recommendations Rolling walker (2 wheels);BSC/3in1  Recommendations for Other Services       Functional Status Assessment Patient has had a recent decline in their functional status and demonstrates the ability to make significant improvements in function in a reasonable and predictable amount of time.     Precautions / Restrictions Precautions Precautions: Fall Restrictions Weight Bearing Restrictions: Yes LLE Weight Bearing: Weight bearing as tolerated      Mobility  Bed Mobility Overal bed mobility: Modified Independent             General bed mobility comments: Extra time and effort only    Transfers Overall transfer level: Needs assistance Equipment used: Rolling walker (2 wheels) Transfers: Sit to/from Stand Sit to Stand: Supervision           General transfer comment: Good control and stability    Ambulation/Gait Ambulation/Gait assistance: Min guard Gait Distance (Feet): 5 Feet Assistive device: Rolling walker (2 wheels) Gait Pattern/deviations: Step-to pattern;Antalgic;Decreased stance time - left Gait velocity: decreased     General Gait Details: Mod verbal and visual cues/demonstration for amb sequencing with step-to pattern for LLE pain control; pt steady although antalgic on the LLE with amb distance limited by nausea  Stairs            Wheelchair Mobility    Modified Rankin (Stroke Patients Only)       Balance Overall balance assessment: Needs assistance;History of Falls   Sitting balance-Leahy Scale: Normal     Standing balance support: Bilateral upper extremity supported;During functional activity Standing balance-Leahy Scale: Good  Pertinent Vitals/Pain Pain Assessment: 0-10 Pain Score: 9  Pain Location: L hip Pain Descriptors / Indicators: Aching;Sore Pain Intervention(s): Premedicated before session;Monitored  during session    Home Living Family/patient expects to be discharged to:: Private residence Living Arrangements: Children;Other relatives Available Help at Discharge: Family;Available 24 hours/day Type of Home: House Home Access: Stairs to enter Entrance Stairs-Rails: Right;Left;Can reach both Entrance Stairs-Number of Steps: 3   Home Layout: One level Home Equipment: Grab bars - tub/shower Additional Comments: Lives with dtr and adult grandchildren, 24/7 supervision available    Prior Function Prior Level of Function : Independent/Modified Independent             Mobility Comments: Ind amb limited community distances without an AD, no other fall history, current fall the result of tripping over stack of lumber on the deck ADLs Comments: Ind with ADLs     Hand Dominance        Extremity/Trunk Assessment   Upper Extremity Assessment Upper Extremity Assessment: Overall WFL for tasks assessed    Lower Extremity Assessment Lower Extremity Assessment: Generalized weakness;LLE deficits/detail LLE: Unable to fully assess due to pain       Communication   Communication: No difficulties  Cognition Arousal/Alertness: Awake/alert Behavior During Therapy: WFL for tasks assessed/performed Overall Cognitive Status: Within Functional Limits for tasks assessed                                          General Comments      Exercises Total Joint Exercises Ankle Circles/Pumps: AROM;Strengthening;Both;10 reps Quad Sets: 10 reps;Both;Strengthening Gluteal Sets: Strengthening;Both;10 reps Long Arc Quad: AROM;Strengthening;Both;10 reps;15 reps Knee Flexion: 15 reps;10 reps;Both;Strengthening;AROM Other Exercises Other Exercises: HEP education for BLE APs, QS, GS, and LAQs x 10 each 5-6x/day   Assessment/Plan    PT Assessment Patient needs continued PT services  PT Problem List Decreased strength;Decreased activity tolerance;Decreased balance;Decreased  mobility;Decreased knowledge of use of DME;Pain       PT Treatment Interventions DME instruction;Gait training;Stair training;Functional mobility training;Therapeutic activities;Therapeutic exercise;Balance training;Patient/family education    PT Goals (Current goals can be found in the Care Plan section)  Acute Rehab PT Goals Patient Stated Goal: To get around better PT Goal Formulation: With patient Time For Goal Achievement: 07/09/21 Potential to Achieve Goals: Good    Frequency 7X/week     Co-evaluation               AM-PAC PT "6 Clicks" Mobility  Outcome Measure Help needed turning from your back to your side while in a flat bed without using bedrails?: A Little Help needed moving from lying on your back to sitting on the side of a flat bed without using bedrails?: A Little Help needed moving to and from a bed to a chair (including a wheelchair)?: A Little Help needed standing up from a chair using your arms (e.g., wheelchair or bedside chair)?: A Little Help needed to walk in hospital room?: A Little Help needed climbing 3-5 steps with a railing? : A Lot 6 Click Score: 17    End of Session Equipment Utilized During Treatment: Gait belt Activity Tolerance: No increased pain;Other (comment) (limited by nausea) Patient left: in chair;with call bell/phone within reach;with chair alarm set Nurse Communication: Mobility status;Weight bearing status PT Visit Diagnosis: History of falling (Z91.81);Muscle weakness (generalized) (M62.81);Difficulty in walking, not elsewhere classified (R26.2);Pain Pain - Right/Left: Left Pain -  part of body: Hip    Time: 4656-8127 PT Time Calculation (min) (ACUTE ONLY): 46 min   Charges:   PT Evaluation $PT Eval Moderate Complexity: 1 Mod PT Treatments $Therapeutic Exercise: 8-22 mins        D. Scott Jastin Fore PT, DPT 06/26/21, 11:19 AM

## 2021-06-26 NOTE — Progress Notes (Signed)
Patient ID: Sandy Eaton, female   DOB: 09-Apr-1941, 81 y.o.   MRN: [id number] [facility] PROGRESS NOTE  Sandy Eaton DGU:[id number] DOB: 1941-05-06 DOA: 06/25/2021 PCP: [facility]  HPI/Subjective: Patient stated that she was diagnosed with COVID a little after Christmas.  She is feeling nauseous now.  Did not eat breakfast.  Had a fall and found to have pelvic fractures.  Feeling a little bit better with regards to the pain today.  Objective: Vitals:   06/26/21 0751 06/26/21 1126  BP: 108/70 103/70  Pulse: 95 88  Resp: 18 18  Temp:  99.4 F (37.4 C)  SpO2: 92% (!) 89%    Intake/Output Summary (Last 24 hours) at 06/26/2021 1245 Last data filed at 06/26/2021 0625 Gross per 24 hour  Intake --  Output 0 ml  Net 0 ml   Filed Weights   06/25/21 0942 06/26/21 0320  Weight: 65.8 kg 65.7 kg    ROS: Review of Systems  Respiratory:  Negative for shortness of breath.   Cardiovascular:  Negative for chest pain.  Gastrointestinal:  Negative for abdominal pain.  Musculoskeletal:  Positive for joint pain. Negative for back pain.  Exam: Physical Exam HENT:     Head: Normocephalic.     Mouth/Throat:     Pharynx: No oropharyngeal exudate.  Eyes:     General: Lids are normal.     Conjunctiva/sclera: Conjunctivae normal.  Cardiovascular:     Rate and Rhythm: Normal rate and regular rhythm.     Heart sounds: Normal heart sounds, S1 normal and S2 normal.  Pulmonary:     Breath sounds: No decreased breath sounds, wheezing, rhonchi or rales.  Abdominal:     Palpations: Abdomen is soft.     Tenderness: There is no abdominal tenderness.  Musculoskeletal:     Right lower leg: No swelling.     Left lower leg: No swelling.  Skin:    General: Skin is warm.     Findings: No rash.  Neurological:     Mental Status: She is alert and oriented to person, place, and time.     Comments: Able to straight leg raise bilaterally, more discomfort with the left.       Scheduled Meds:  aspirin EC  81 mg Oral Daily   cholecalciferol  1,000 Units Oral Daily   enoxaparin (LOVENOX) injection  40 mg Subcutaneous Q24H   gabapentin  300 mg Oral BID   mometasone-formoterol  2 puff Inhalation BID   montelukast  10 mg Oral QHS   sertraline  50 mg Oral Daily   tiotropium  1 capsule Inhalation Daily   vitamin B-12  1,000 mcg Oral Daily    Assessment/Plan:  Multiple pelvic fractures including his left superior pubic rami and left pubic bone.  Pain control.  Physical therapy evaluation.  Physical therapy would like to see the patient again tomorrow. Nausea.  Had nursing staff give IV Zofran this morning.  Must eat with pain medications. COVID-19 infection.  Pulse ox 89%.  Will obtain a chest x-ray.  Looking back PCP did write a note on 30 December that the patient was COVID-positive.  Likely would just need 1 more day of isolation. Asthma COPD start albuterol inhaler.  Patient already on [facility].  Patient on Singulair also Neuropathy on gabapentin      Code Status:     Code Status Orders  (From admission, onward)           Start  Ordered   06/25/21 1438  Full code  Continuous        06/25/21 1438           Code Status History     Date Active Date Inactive Code Status Order ID Comments User Context   06/10/2018 0055 06/11/2018 1904 Full Code [id number]  Bertrum Sol, MD Inpatient   05/05/2015 0918 05/06/2015 1913 Full Code [id number]  Katha Hamming, MD ED   11/26/2014 1757 11/29/2014 1732 Full Code [id number]  Ramonita Lab, MD Inpatient      Family Communication: Left message for daughter on the phone Disposition Plan: Status is: Observation  Yon Schiffman Lawyer

## 2021-06-26 NOTE — Progress Notes (Signed)
Occupational Therapy Treatment Patient Details Name: Olivya Sobol Taussig MRN: [id number] DOB: 12/21/1940 Today's Date: 06/26/2021   History of present illness Pt is an 81 y.o. female with medical history significant of depression, arthritis, neuropathy, GERD, hyperlipidemia, COPD/asthma, former smoker presents here for evaluation of fall. MD assessment includes nondisplaced left superior pubic ramus fracture and recent Covid infection, per patient "around Christmas time".   OT comments  Patient presenting with decreased Ind in self care, balance, functional mobility/transfers, endurance, and safety awareness. Patient reports living at home independently with family present to assist as needed at discharge. Pt was limited by pain and nausea this morning but reports pain managed but just feeling very fatigued. Pt standing from recliner chair with supervision and ambulates short distance to bed with RW. Pt able to perform sit >supine without assistance. OT educated pt on dressing L LE first for pain management and she needed assistance to thread L foot to don mesh briefs. Pt performs bridge independently to pull over B hips while in bed. Pt has two adult grandchildren that live in the home to assist if needed.  Patient will benefit from acute OT to increase overall independence in the areas of ADLs, functional mobility, and safety awareness in order to safely discharge home with family.    Recommendations for follow up therapy are one component of a multi-disciplinary discharge planning process, led by the attending physician.  Recommendations may be updated based on patient status, additional functional criteria and insurance authorization.    Follow Up Recommendations  No OT follow up    Assistance Recommended at Discharge Set up Supervision/Assistance  Patient can return home with the following  A little help with walking and/or transfers;A little help with bathing/dressing/bathroom   Equipment  Recommendations  BSC/3in1;Other (comment) (RW)     Precautions / Restrictions Precautions Precautions: Fall Restrictions Weight Bearing Restrictions: Yes LLE Weight Bearing: Weight bearing as tolerated       Mobility Bed Mobility Overal bed mobility: Modified Independent             General bed mobility comments: Extra time and effort only    Transfers Overall transfer level: Needs assistance Equipment used: Rolling walker (2 wheels) Transfers: Sit to/from Stand Sit to Stand: Supervision           General transfer comment: Good control and stability     Balance Overall balance assessment: Needs assistance;History of Falls   Sitting balance-Leahy Scale: Normal     Standing balance support: Bilateral upper extremity supported;During functional activity Standing balance-Leahy Scale: Good                             ADL either performed or assessed with clinical judgement   ADL Overall ADL's : Needs assistance/impaired                     Lower Body Dressing: Minimal assistance Lower Body Dressing Details (indicate cue type and reason): to thread mesh underwear over L LE Toilet Transfer: Min Pension scheme manager Details (indicate cue type and reason): simulated                Extremity/Trunk Assessment Upper Extremity Assessment Upper Extremity Assessment: Overall WFL for tasks assessed   Lower Extremity Assessment Lower Extremity Assessment: Generalized weakness LLE: Unable to fully assess due to pain        Vision Patient Visual Report: No change from baseline  Cognition Arousal/Alertness: Awake/alert Behavior During Therapy: WFL for tasks assessed/performed Overall Cognitive Status: Within Functional Limits for tasks assessed                                            Exercises Total Joint Exercises Ankle Circles/Pumps: AROM;Strengthening;Both;10 reps Quad Sets: 10  reps;Both;Strengthening Gluteal Sets: Strengthening;Both;10 reps Long Arc Quad: AROM;Strengthening;Both;10 reps;15 reps Knee Flexion: 15 reps;10 reps;Both;Strengthening;AROM Other Exercises Other Exercises: HEP education for BLE APs, QS, GS, and LAQs x 10 each 5-6x/day           Pertinent Vitals/ Pain       Pain Assessment: 0-10 Pain Score: 2  Pain Location: L hip Pain Descriptors / Indicators: Aching;Sore Pain Intervention(s): Monitored during session;Premedicated before session  Home Living Family/patient expects to be discharged to:: Private residence Living Arrangements: Children;Other relatives Available Help at Discharge: Family;Available 24 hours/day Type of Home: House Home Access: Stairs to enter [employer] of Steps: 3 Entrance Stairs-Rails: Right;Left;Can reach both Home Layout: One level     Bathroom Shower/Tub: Producer, television/film/video: Handicapped height     Home Equipment: Grab bars - tub/shower   Additional Comments: Lives with dtr and adult grandchildren, 24/7 supervision available          Frequency  Min 2X/week        Progress Toward Goals  OT Goals(current goals can now be found in the care plan section)     Acute Rehab OT Goals Patient Stated Goal: to return home with family OT Goal Formulation: With patient Time For Goal Achievement: 07/10/21 Potential to Achieve Goals: Good ADL Goals Pt Will Perform Grooming: with modified independence;standing Pt Will Perform Lower Body Dressing: with modified independence;sit to/from stand Pt Will Transfer to Toilet: with modified independence;ambulating Pt Will Perform Toileting - Clothing Manipulation and hygiene: with modified independence;sit to/from stand  Plan         AM-PAC OT "6 Clicks" Daily Activity     Outcome Measure   Help from another person eating meals?: None Help from another person taking care of personal grooming?: None Help from another person  toileting, which includes using toliet, bedpan, or urinal?: A Little Help from another person bathing (including washing, rinsing, drying)?: A Little Help from another person to put on and taking off regular upper body clothing?: None Help from another person to put on and taking off regular lower body clothing?: A Little 6 Click Score: 21    End of Session Equipment Utilized During Treatment: Rolling walker (2 wheels)  OT Visit Diagnosis: Unsteadiness on feet (R26.81);Muscle weakness (generalized) (M62.81);History of falling (Z91.81)   Activity Tolerance Patient tolerated treatment well   Patient Left in bed;with call bell/phone within reach;with bed alarm set   Nurse Communication Mobility status        Time: 9163-8466 OT Time Calculation (min): 23 min  Charges: OT General Charges $OT Visit: 1 Visit OT Evaluation $OT Eval Low Complexity: 1 Low OT Treatments $Self Care/Home Management : 8-22 mins  Jackquline Denmark, MS, OTR/L , CBIS ascom [phone]  06/26/21, 2:16 PM

## 2021-06-27 MED ORDER — POLYETHYLENE GLYCOL 3350 17 G PO PACK: 17.0000 g | PACK | Freq: Every day | ORAL | 0 refills | Status: DC | PRN

## 2021-06-27 MED ORDER — LOPERAMIDE HCL 2 MG PO CAPS
4.0000 mg | ORAL_CAPSULE | Freq: Once | ORAL | Status: AC
  Administered 2021-06-27: 4 mg via ORAL
  Filled 2021-06-27: qty 2

## 2021-06-27 MED ORDER — OYSTER SHELL CALCIUM/D3 500-5 MG-MCG PO TABS: 1.0000 | ORAL_TABLET | Freq: Two times a day (BID) | ORAL | 0 refills | Status: DC

## 2021-06-27 MED ORDER — ONDANSETRON HCL 4 MG PO TABS: 4.0000 mg | ORAL_TABLET | Freq: Four times a day (QID) | ORAL | 0 refills | Status: DC | PRN

## 2021-06-27 MED ORDER — OXYCODONE HCL 5 MG PO TABS: 5.0000 mg | ORAL_TABLET | Freq: Four times a day (QID) | ORAL | 0 refills | Status: AC | PRN

## 2021-06-27 NOTE — Progress Notes (Signed)
Blood pressure 98/62, pulse 88, temperature 97.7 F (36.5 C), resp. rate 16, height 5\' 3"  (1.6 m), weight 65.3 kg, SpO2 95 %. IV removed site c/d/I discussed d/c packet with pt, pt d/c via w/c down to private car.

## 2021-06-27 NOTE — Progress Notes (Signed)
Physical Therapy Treatment Patient Details Name: Sandy Eaton MRN: [id number] DOB: [birth year] Today's Date: 06/27/2021   History of Present Illness Pt is an 81 y.o. female with medical history significant of depression, arthritis, neuropathy, GERD, hyperlipidemia, COPD/asthma, former smoker presents here for evaluation of fall. MD assessment includes nondisplaced left superior pubic ramus fracture and recent Covid infection, per patient "around Christmas time".    PT Comments    Pt was pleasant and motivated to participate during the session and put forth good effort throughout. Pt was able to ambulate without LOB or buckling and was steady ascending and descending steps.  Pt reported no adverse symptoms during the session other than L hip pain. Pt will benefit from HHPT upon discharge to safely address deficits listed in patient problem list for decreased caregiver assistance and eventual return to PLOF.     Recommendations for follow up therapy are one component of a multi-disciplinary discharge planning process, led by the attending physician.  Recommendations may be updated based on patient status, additional functional criteria and insurance authorization.  Follow Up Recommendations  Home health PT     Assistance Recommended at Discharge Frequent or constant Supervision/Assistance  Patient can return home with the following A little help with walking and/or transfers;A little help with bathing/dressing/bathroom;Assistance with cooking/housework;Assist for transportation;Help with stairs or ramp for entrance   Equipment Recommendations  Rolling walker (2 wheels);BSC/3in1    Recommendations for Other Services       Precautions / Restrictions Precautions Precautions: Fall Restrictions Weight Bearing Restrictions: Yes LLE Weight Bearing: Weight bearing as tolerated     Mobility  Bed Mobility Overal bed mobility: Modified Independent             General bed mobility  comments: Extra time and effort only    Transfers Overall transfer level: Needs assistance Equipment used: Rolling walker (2 wheels) Transfers: Sit to/from Stand Sit to Stand: Supervision           General transfer comment: Good control and stability    Ambulation/Gait Ambulation/Gait assistance: Supervision Gait Distance (Feet): 20 Feet Assistive device: Rolling walker (2 wheels) Gait Pattern/deviations: Step-to pattern;Antalgic;Decreased stance time - left Gait velocity: decreased     General Gait Details: Mod verbal cues for step-to sequencing for pain control   Stairs Stairs: Yes Stairs assistance: Min guard Stair Management: Two rails Number of Stairs: 4 General stair comments: Min to mod verbal and visual cues for sequencing with pt able to ascend and descend stairs with good control and stability   Wheelchair Mobility    Modified Rankin (Stroke Patients Only)       Balance Overall balance assessment: Needs assistance;History of Falls   Sitting balance-Leahy Scale: Normal     Standing balance support: Bilateral upper extremity supported;During functional activity Standing balance-Leahy Scale: Good                              Cognition Arousal/Alertness: Awake/alert Behavior During Therapy: WFL for tasks assessed/performed Overall Cognitive Status: Within Functional Limits for tasks assessed                                          Exercises Other Exercises Other Exercises: Pt education for principles of activity progression and physiological benefits of activity    General Comments  Pertinent Vitals/Pain Pain Assessment: 0-10 Pain Score: 7  Pain Location: L hip Pain Descriptors / Indicators: Aching;Sore Pain Intervention(s): Premedicated before session;Monitored during session;Repositioned    Home Living                          Prior Function            PT Goals (current goals can  now be found in the care plan section) Progress towards PT goals: Progressing toward goals    Frequency    7X/week      PT Plan Current plan remains appropriate    Co-evaluation              AM-PAC PT "6 Clicks" Mobility   Outcome Measure  Help needed turning from your back to your side while in a flat bed without using bedrails?: A Little Help needed moving from lying on your back to sitting on the side of a flat bed without using bedrails?: A Little Help needed moving to and from a bed to a chair (including a wheelchair)?: A Little Help needed standing up from a chair using your arms (e.g., wheelchair or bedside chair)?: A Little Help needed to walk in hospital room?: A Little Help needed climbing 3-5 steps with a railing? : A Little 6 Click Score: 18    End of Session Equipment Utilized During Treatment: Gait belt Activity Tolerance: Patient tolerated treatment well Patient left: in bed;with call bell/phone within reach;with bed alarm set Nurse Communication: Mobility status;Weight bearing status PT Visit Diagnosis: History of falling (Z91.81);Muscle weakness (generalized) (M62.81);Difficulty in walking, not elsewhere classified (R26.2);Pain Pain - Right/Left: Left Pain - part of body: Hip     Time: 8003-4917 PT Time Calculation (min) (ACUTE ONLY): 29 min  Charges:  $Gait Training: 8-22 mins $Therapeutic Activity: 8-22 mins                     D. Scott Kale Dols PT, DPT 06/27/21, 11:15 AM

## 2021-06-27 NOTE — TOC Progression Note (Addendum)
Transition of Care [facility]) - Progression Note    Patient Details  Name: Sandy Eaton MRN: [id number] Date of Birth: 01-12-1941  Transition [facility]) CM/SW [place], RN Phone Number: 06/27/2021, 8:57 AM  Clinical Narrative:   [facility] has accepted the patient for [facility] services, daughter to provide transportation    Expected Discharge Plan: [place] Barriers to Discharge: Continued Medical Work up  Expected Discharge Plan and Services Expected Discharge Plan: [place]   Discharge Planning Services: CM Consult   Living arrangements for the past 2 months: Single Family Home Expected Discharge Date: 06/27/21               DME Arranged: Gilford Rile rolling, 3-N-1 DME Agency: [employer] Date DME Agency Contacted: 06/26/21 Time DME Agency Contacted: [phone] Representative spoke with at DME Agency: [place]: PT [place]: [facility] Date [place]: 06/26/21 Time [place]: [place] Representative spoke with at [place]: [place] ([place]) Interventions    Readmission Risk Interventions No flowsheet data found.

## 2021-06-27 NOTE — Discharge Summary (Signed)
[facility] [place] [facility]   PATIENT NAME: Sandy Eaton    MR#:  [id number]  DATE OF BIRTH:  10-15-1940  DATE OF ADMISSION:  06/25/2021 ADMITTING PHYSICIAN: Alford Highlandichard Sarabella Caprio, MD  DATE OF DISCHARGE: 06/27/2021  4:35 PM  PRIMARY CARE PHYSICIAN: [place], [facility]    ADMISSION DIAGNOSIS:  Pubic ramus fracture (HCC) [S32.599A] Closed fracture of superior ramus of left pubis, initial encounter (HCC) [S32.512A] Multiple closed stable fractures of pubic ramus (HCC) [S32.599A]  DISCHARGE DIAGNOSIS:  Principal Problem:   Pubic ramus fracture (HCC) Active Problems:   COPD (chronic obstructive pulmonary disease) (HCC)   GERD (gastroesophageal reflux disease)   Multiple closed pelvic fractures without disruption of pelvic circle (HCC)   Nausea   COVID-19 virus infection   Hypoxia   Neuropathy   Multiple closed stable fractures of pubic ramus (HCC)   SECONDARY DIAGNOSIS:   Past Medical History:  Diagnosis Date   Asthma    Chronic asthmatic bronchitis (HCC)    COPD (chronic obstructive pulmonary disease) (HCC)    Suspected   Dyspnea    on exertion   Environmental allergies    GERD (gastroesophageal reflux disease)    Headache    migraine without aura, not intractable   Hyperlipidemia    Osteopenia    of lumbar spine   Pneumonia    3 times    HOSPITAL COURSE:   Multiple pelvic fractures including left superior pubic rami and left pubic bone.  Tylenol for pain control as needed oxycodone for worsening pain.  Did well with physical therapy upon going home.  Home health set up. Nausea.  This has improved.  As needed Zofran to go home with.  Must eat with pain medications. COVID-19 infection.  Pulse ox normal today.  Chest x-ray negative.  Original diagnosis likely on December 30.  Discontinued isolation. Asthma and COPD.  Patient already on Dulera inhaler and Singulair.  Respiratory status stable Neuropathy on gabapentin. Initially the patient  told me she had constipation but then the nurse called me back that she had diarrhea.  DISCHARGE CONDITIONS:  Satisfactory  CONSULTS OBTAINED:  Treatment Team:  Christena FlakePoggi, John J, MD  DRUG ALLERGIES:   Allergies  Allergen Reactions   Augmentin [Amoxicillin-Pot Clavulanate] Nausea And Vomiting    Has patient had a PCN reaction causing immediate rash, facial/tongue/throat swelling, SOB or lightheadedness with hypotension: No Has patient had a PCN reaction causing severe rash involving mucus membranes or skin necrosis: No Has patient had a PCN reaction that required hospitalization: No Has patient had a PCN reaction occurring within the last 10 years: No If all of the above answers are "NO", then may proceed with Cephalosporin use.    Escitalopram Oxalate Diarrhea   Valium [Diazepam] Other (See Comments)    Reaction:  Hallucinations    Wixela Inhub [Fluticasone-Salmeterol]     Thrush      DISCHARGE MEDICATIONS:   Allergies as of 06/27/2021       Reactions   Augmentin [amoxicillin-pot Clavulanate] Nausea And Vomiting   Has patient had a PCN reaction causing immediate rash, facial/tongue/throat swelling, SOB or lightheadedness with hypotension: No Has patient had a PCN reaction causing severe rash involving mucus membranes or skin necrosis: No Has patient had a PCN reaction that required hospitalization: No Has patient had a PCN reaction occurring within the last 10 years: No If all of the above answers are "NO", then may proceed with Cephalosporin use.   Escitalopram Oxalate Diarrhea  Valium [diazepam] Other (See Comments)   Reaction:  Hallucinations    Wixela Inhub [fluticasone-salmeterol]    Thrush          Medication List     TAKE these medications    acetaminophen 500 MG tablet Commonly known as: TYLENOL Take 1,000 mg by mouth 2 (two) times daily as needed for mild pain or headache.   aspirin EC 81 MG tablet Take 81 mg by mouth daily.   benzonatate 100 MG  capsule Commonly known as: TESSALON Take 1 capsule by mouth three times daily as needed for cough   budesonide-formoterol 160-4.5 MCG/ACT inhaler Commonly known as: SYMBICORT Inhale 2 puffs into the lungs 2 (two) times daily.   calcium-vitamin D 500-5 MG-MCG tablet Commonly known as: OSCAL WITH D Take 1 tablet by mouth 2 (two) times daily.   cholecalciferol 25 MCG (1000 UNIT) tablet Commonly known as: VITAMIN D3 Take 1,000 Units by mouth daily.   gabapentin 300 MG capsule Commonly known as: NEURONTIN Take 300 mg by mouth 2 (two) times daily.   ipratropium-albuterol 0.5-2.5 (3) MG/3ML Soln Commonly known as: DUONEB Take 3 mLs by nebulization 4 (four) times daily as needed for shortness of breath or wheezing.   montelukast 10 MG tablet Commonly known as: SINGULAIR Take 1 tablet (10 mg total) by mouth at bedtime.   ondansetron 4 MG tablet Commonly known as: ZOFRAN Take 1 tablet (4 mg total) by mouth every 6 (six) hours as needed for nausea.   oxyCODONE 5 MG immediate release tablet Commonly known as: Oxy IR/ROXICODONE Take 1 tablet (5 mg total) by mouth every 6 (six) hours as needed for up to 2 days for severe pain.   ProAir RespiClick 108 (90 Base) MCG/ACT Aepb Generic drug: Albuterol Sulfate Inhale 2 puffs into the lungs every 4 (four) hours as needed. What changed: reasons to take this   sertraline 50 MG tablet Commonly known as: ZOLOFT Take 50 mg by mouth daily.   Spiriva Respimat 1.25 MCG/ACT Aers Generic drug: Tiotropium Bromide Monohydrate Inhale 2 puffs into the lungs daily.   vitamin B-12 1000 MCG tablet Commonly known as: CYANOCOBALAMIN Take 1,000 mcg by mouth daily.               Durable Medical Equipment  (From admission, onward)           Start     Ordered   06/27/21 0715  For home use only DME 3 n 1  Once        06/27/21 0714   06/26/21 1511  For home use only DME Walker rolling  Once       Question Answer Comment  Walker: With 5  Inch Wheels   Patient needs a walker to treat with the following condition Weakness      06/26/21 1510   06/26/21 1503  For home use only DME Walker rolling  Once       Question Answer Comment  Walker: With 5 Inch Wheels   Patient needs a walker to treat with the following condition Weakness      06/26/21 1502             DISCHARGE INSTRUCTIONS:   Follow-up PMD 5 days Follow-up Dr. Joice Lofts orthopedics in a few weeks  If you experience worsening of your admission symptoms, develop shortness of breath, life threatening emergency, suicidal or homicidal thoughts you must seek medical attention immediately by calling 911 or calling your MD immediately  if symptoms less severe.  You Must read complete instructions/literature along with all the possible adverse reactions/side effects for all the Medicines you take and that have been prescribed to you. Take any new Medicines after you have completely understood and accept all the possible adverse reactions/side effects.   Please note  You were cared for by a hospitalist during your hospital stay. If you have any questions about your discharge medications or the care you received while you were in the hospital after you are discharged, you can call the unit and asked to speak with the hospitalist on call if the hospitalist that took care of you is not available. Once you are discharged, your primary care physician will handle any further medical issues. Please note that NO REFILLS for any discharge medications will be authorized once you are discharged, as it is imperative that you return to your primary care physician (or establish a relationship with a primary care physician if you do not have one) for your aftercare needs so that they can reassess your need for medications and monitor your lab values.    Today   CHIEF COMPLAINT:   Chief Complaint  Patient presents with   Fall   Hip Pain   Shoulder Pain    HISTORY OF PRESENT  ILLNESS:  Tarin Johndrow  is a 81 y.o. female came in with a fall and found to have multiple pelvic bone fractures.   VITAL SIGNS:  Blood pressure 98/62, pulse 88, temperature 97.7 F (36.5 C), resp. rate 16, height 5\' 3"  (1.6 m), weight 65.3 kg, SpO2 95 %.  I/O:  No intake or output data in the 24 hours ending 06/27/21 1707  PHYSICAL EXAMINATION:  GENERAL:  81 y.o.-year-old patient lying in the bed with no acute distress.  EYES: Pupils equal, round, reactive to light and accommodation. No scleral icterus.  HEENT: Head atraumatic, normocephalic. Oropharynx and nasopharynx clear.  LUNGS: Normal breath sounds bilaterally, no wheezing, rales,rhonchi or crepitation. No use of accessory muscles of respiration.  CARDIOVASCULAR: S1, S2 normal. No murmurs, rubs, or gallops.  ABDOMEN: Soft, non-tender, non-distended.  EXTREMITIES: No pedal edema.  NEUROLOGIC: Cranial nerves II through XII are intact. Muscle strength 5/5 in all extremities. Sensation intact.  The patient was able to get out of the bed on her own with a walker and take a few steps PSYCHIATRIC: The patient is alert and oriented x 3.  SKIN: No obvious rash, lesion, or ulcer.   DATA REVIEW:   CBC Recent Labs  Lab 06/25/21 1221  WBC 8.3  HGB 13.2  HCT 39.1  PLT 209    Chemistries  Recent Labs  Lab 06/26/21 0424  NA 140  K 3.9  CL 107  CO2 26  GLUCOSE 175*  BUN 18  CREATININE 0.54  CALCIUM 8.7*     Microbiology Results  Results for orders placed or performed during the hospital encounter of 06/25/21  Resp Panel by RT-PCR (Flu A&B, Covid) Nasopharyngeal Swab     Status: Abnormal   Collection Time: 06/25/21  2:13 PM   Specimen: Nasopharyngeal Swab; Nasopharyngeal(NP) swabs in vial transport medium  Result Value Ref Range Status   SARS Coronavirus 2 by RT PCR POSITIVE (A) NEGATIVE Final    Comment: (NOTE) SARS-CoV-2 target nucleic acids are DETECTED.  The SARS-CoV-2 RNA is generally detectable in upper  respiratory specimens during the acute phase of infection. Positive results are indicative of the presence of the identified virus, but do not rule out bacterial infection or co-infection with other  pathogens not detected by the test. Clinical correlation with patient history and other diagnostic information is necessary to determine patient infection status. The expected result is Negative.  Fact Sheet for Patients: BloggerCourse.comhttps://www.fda.gov/media/152166/download  Fact Sheet for Healthcare Providers: SeriousBroker.ithttps://www.fda.gov/media/152162/download  This test is not yet approved or cleared by the [place] FDA and  has been authorized for detection and/or diagnosis of SARS-CoV-2 by FDA under an Emergency Use Authorization (EUA).  This EUA will remain in effect (meaning this test can be used) for the duration of  the COVID-19 declaration under Section 564(b)(1) of the A ct, 21 U.S.C. section 360bbb-3(b)(1), unless the authorization is terminated or revoked sooner.     Influenza A by PCR NEGATIVE NEGATIVE Final   Influenza B by PCR NEGATIVE NEGATIVE Final    Comment: (NOTE) The Xpert Xpress SARS-CoV-2/FLU/RSV plus assay is intended as an aid in the diagnosis of influenza from Nasopharyngeal swab specimens and should not be used as a sole basis for treatment. Nasal washings and aspirates are unacceptable for Xpert Xpress SARS-CoV-2/FLU/RSV testing.  Fact Sheet for Patients: BloggerCourse.comhttps://www.fda.gov/media/152166/download  Fact Sheet for Healthcare Providers: SeriousBroker.ithttps://www.fda.gov/media/152162/download  This test is not yet approved or cleared by the [place] FDA and has been authorized for detection and/or diagnosis of SARS-CoV-2 by FDA under an Emergency Use Authorization (EUA). This EUA will remain in effect (meaning this test can be used) for the duration of the COVID-19 declaration under Section 564(b)(1) of the Act, 21 U.S.C. section 360bbb-3(b)(1), unless the authorization is  terminated or revoked.  Performed at [facility], [address] [place], [place] [address]     [facility]  [facility]  Result Date: 06/26/2021 CLINICAL DATA:  Hypoxia, COVID-19 infection EXAM: PORTABLE CHEST 1 VIEW COMPARISON:  02/24/2021 FINDINGS: Stable cardiomediastinal contours. Atherosclerotic calcification of the aortic knob. No focal airspace consolidation, pleural effusion, or pneumothorax. Degenerative changes of the bilateral shoulders. IMPRESSION: No acute cardiopulmonary process. Electronically Signed   By: Duanne GuessNicholas  Plundo D.O.   On: 06/26/2021 14:02       Management plans discussed with the patient, family and they are in agreement.  CODE STATUS:     Code Status Orders  (From admission, onward)           Start     Ordered   06/25/21 1438  Full code  Continuous        06/25/21 1438           Code Status History     Date Active Date Inactive Code Status Order ID Comments User Context   06/10/2018 0055 06/11/2018 1904 Full Code [id number]  Bertrum SolSalary, Montell D, MD Inpatient   05/05/2015 0918 05/06/2015 1913 Full Code [id number]  Katha HammingKonidena, Snehalatha, MD ED   11/26/2014 1757 11/29/2014 1732 Full Code [id number]  Ramonita LabGouru, Aruna, MD Inpatient       TOTAL TIME TAKING CARE OF THIS PATIENT: 32 minutes.    Alford Highlandichard Taylorann Tkach M.D on 06/27/2021 at 5:07 PM    [facility]  CC: Primary care physician; [place]errilyn [place], [facility]

## 2021-06-28 ENCOUNTER — Ambulatory Visit: Payer: Medicare Other | Admitting: Primary Care

## 2021-06-30 ENCOUNTER — Ambulatory Visit: Payer: Medicare Other | Admitting: Primary Care

## 2021-06-30 NOTE — Progress Notes (Deleted)
 @Patient  ID: Sandy Eaton, female    DOB: 1941/05/20, 81 y.o.   MRN: [id number]  No chief complaint on file.   Referring provider: Dan Humphreys[place], [facility]  HPI:  81 year old female, former smoker. PMH significant for chronic asthmatic bronchitis, pneumonia, dyspnea on exertion. Previous patient of Dr. Sung AmabileSimonds, last seen on 01/02/19. Normal PFTs in 2017.   Previous [facility] pulmonary encounter: 02/23/2020 Patient presents today for 1 year follow-up asthma. Accompanied by her daughter. Patient reports increased shortness of breath over the last month. She has been out of work since July. Daughter feels she gets winded easier over the last two years. 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 She works 8 hours, states that lately just being there causes her to be short winded. Heat affects her breathing. She likes to work. PCP increased Symbicort 160. Most days she only uses Symbicort inhaler once a day. States that most of the time she just forgets to take her second does if she is busy that day. Inhaler cost her 90 dollars. She takes Singulair 10mg  as prescribed. She has right sided back pain, this appears chronic but is on the same side she has had previous pneumonia.   09/27/2020- Interim hx Patient presents today for follow-up chronic asthmatic bronchitis. Accompanied by her daughter. Patient experiencing thrush symptoms with Wixella. She had an upper endoscopy that showed growth of yeast. She was treated with diflucan for a total of 4 weeks. Changed Wixella to low dose Symbicort and added a spacer. She reports symptoms of shortness of breath and productive cough with clear/beige mucus. Benzonatate helps her cough, needs refill. She is compliant with Singulair 10mg  at bedtime. She is still dealing with esophageal discomfort.  She has chronic right upper back pain.   Pulmonary function testing: 06/22/15 FVC 3.20 (113%), FEV1 2.29 (108%), ratio 72  Imaging: 02/26/20 CTA- No PE. No edema or airspace opacity.  Chronic scarring and atelectatic change in the inferior lingula. Stable apical scarring. No pleural effusions. No evident adenopathy.      Allergies  Allergen Reactions   Augmentin [Amoxicillin-Pot Clavulanate] Nausea And Vomiting    Has patient had a PCN reaction causing immediate rash, facial/tongue/throat swelling, SOB or lightheadedness with hypotension: No Has patient had a PCN reaction causing severe rash involving mucus membranes or skin necrosis: No Has patient had a PCN reaction that required hospitalization: No Has patient had a PCN reaction occurring within the last 10 years: No If all of the above answers are "NO", then may proceed with Cephalosporin use.    Escitalopram Oxalate Diarrhea   Valium [Diazepam] Other (See Comments)    Reaction:  Hallucinations    Wixela Inhub [Fluticasone-Salmeterol]     Thrush      Immunization History  Administered Date(s) Administered   Influenza Split 03/26/2013   Influenza, High Dose Seasonal PF 02/15/2016, 03/07/2017, 02/17/2019, 02/17/2019   Influenza, Seasonal, Injecte, Preservative Fre 03/18/2013   Influenza,inj,Quad PF,6+ Mos 05/06/2015   Influenza-Unspecified 03/31/2014, 05/06/2015, 02/16/2016   PFIZER(Purple Top)SARS-COV-2 Vaccination 04/09/2020   Pneumococcal Conjugate-13 01/26/2015   Pneumococcal Polysaccharide-23 02/22/2016, 01/04/2018   Tdap 02/10/2013   Zoster Recombinat (Shingrix) 04/02/2019, 07/03/2019   Zoster, Live 03/18/2014    Past Medical History:  Diagnosis Date   Asthma    Chronic asthmatic bronchitis (HCC)    COPD (chronic obstructive pulmonary disease) (HCC)    Suspected   Dyspnea    on exertion   Environmental allergies    GERD (gastroesophageal reflux disease)  Headache    migraine without aura, not intractable   Hyperlipidemia    Osteopenia    of lumbar spine   Pneumonia    3 times    Tobacco History: Social History   Tobacco Use  Smoking Status Former   Packs/day: 0.50   Years:  30.00   Pack years: 15.00   Types: Cigarettes   Quit date: 02/24/2009   Years since quitting: 12.3  Smokeless Tobacco Never   Counseling given: Not Answered   Outpatient Medications Prior to Visit  Medication Sig Dispense Refill   acetaminophen (TYLENOL) 500 MG tablet Take 1,000 mg by mouth 2 (two) times daily as needed for mild pain or headache.     Albuterol Sulfate (PROAIR RESPICLICK) 108 (90 Base) MCG/ACT AEPB Inhale 2 puffs into the lungs every 4 (four) hours as needed. (Patient taking differently: Inhale 2 puffs into the lungs every 4 (four) hours as needed (shortness of breath or wheezing).) 1 each 3   aspirin EC 81 MG tablet Take 81 mg by mouth daily.     benzonatate (TESSALON) 100 MG capsule Take 1 capsule by mouth three times daily as needed for cough 30 capsule 11   budesonide-formoterol (SYMBICORT) 160-4.5 MCG/ACT inhaler Inhale 2 puffs into the lungs 2 (two) times daily.     calcium-vitamin D (OSCAL WITH D) 500-5 MG-MCG tablet Take 1 tablet by mouth 2 (two) times daily. 60 tablet 0   cholecalciferol (VITAMIN D3) 25 MCG (1000 UNIT) tablet Take 1,000 Units by mouth daily.     gabapentin (NEURONTIN) 300 MG capsule Take 300 mg by mouth 2 (two) times daily.     ipratropium-albuterol (DUONEB) 0.5-2.5 (3) MG/3ML SOLN Take 3 mLs by nebulization 4 (four) times daily as needed for shortness of breath or wheezing.     montelukast (SINGULAIR) 10 MG tablet Take 1 tablet (10 mg total) by mouth at bedtime. 30 tablet 5   ondansetron (ZOFRAN) 4 MG tablet Take 1 tablet (4 mg total) by mouth every 6 (six) hours as needed for nausea. 20 tablet 0   sertraline (ZOLOFT) 50 MG tablet Take 50 mg by mouth daily.     Tiotropium Bromide Monohydrate (SPIRIVA RESPIMAT) 1.25 MCG/ACT AERS Inhale 2 puffs into the lungs daily. 4 g 5   vitamin B-12 (CYANOCOBALAMIN) 1000 MCG tablet Take 1,000 mcg by mouth daily.     No facility-administered medications prior to visit.      Review of Systems  Review of  Systems   Physical Exam  There were no vitals taken for this visit. Physical Exam   Lab Results:  CBC    Component Value Date/Time   WBC 8.3 06/25/2021 1221   RBC 4.16 06/25/2021 1221   HGB 13.2 06/25/2021 1221   HGB 13.6 01/31/2014 1120   HCT 39.1 06/25/2021 1221   HCT 41.4 01/31/2014 1120   PLT 209 06/25/2021 1221   PLT 178 01/31/2014 1120   MCV 94.0 06/25/2021 1221   MCV 97 01/31/2014 1120   MCH 31.7 06/25/2021 1221   MCHC 33.8 06/25/2021 1221   RDW 11.9 06/25/2021 1221   RDW 12.3 01/31/2014 1120   LYMPHSABS 2.7 02/23/2020 1733   MONOABS 0.8 02/23/2020 1733   EOSABS 0.3 02/23/2020 1733   BASOSABS 0.1 02/23/2020 1733    BMET    Component Value Date/Time   NA 140 06/26/2021 0424   NA 140 01/31/2014 1120   K 3.9 06/26/2021 0424   K 3.6 01/31/2014 1120   CL 107 06/26/2021  0424   CL 105 01/31/2014 1120   CO2 26 06/26/2021 0424   CO2 23 01/31/2014 1120   GLUCOSE 175 (H) 06/26/2021 0424   GLUCOSE 149 (H) 01/31/2014 1120   BUN 18 06/26/2021 0424   BUN 13 01/31/2014 1120   CREATININE 0.54 06/26/2021 0424   CREATININE 0.88 01/31/2014 1120   CALCIUM 8.7 (L) 06/26/2021 0424   CALCIUM 8.1 (L) 01/31/2014 1120   GFRNONAA >60 06/26/2021 0424   GFRNONAA >60 01/31/2014 1120   GFRAA >60 11/06/2018 1732   GFRAA >60 01/31/2014 1120    BNP    Component Value Date/Time   BNP 9.0 05/05/2015 0817    ProBNP No results found for: PROBNP  Imaging: CT PELVIS WO CONTRAST  Result Date: 06/25/2021 CLINICAL DATA:  Patient fell yesterday with persistent left hip pain and difficulty walking. EXAM: CT PELVIS WITHOUT CONTRAST TECHNIQUE: Multidetector CT imaging of the pelvis was performed following the standard protocol without intravenous contrast. COMPARISON:  None. FINDINGS: Urinary Tract:  No abnormality visualized. Bowel:  Unremarkable visualized pelvic bowel loops. Vascular/Lymphatic: Atherosclerotic calcification noted distal aorta. No pelvic sidewall lymphadenopathy.  Reproductive:  Uterus surgically absent.  There is no adnexal mass. Other:  No intraperitoneal free fluid. Musculoskeletal: No left femoral neck fracture. The patient does have an extremely subtle fracture involving the left superior pubic ramus and left pubic bone with superior cortical off step visible in the pubic bone on coronal 39/4 in lateral cortical off step visible on coronal 43/4. Despite the evidence of cortical disruption, a discrete fracture line is not visible. Soft tissue images do show some stranding around the left superior pubic ramus and pubic bone compatible with edema/hemorrhage. Subcutaneous edema noted lateral to the left greater trochanter. No soft tissue hematoma evident in the region of the left hip. No evidence for sacral fracture. Left-sided L5 pars defect evident. Advanced degenerative disc disease noted L5-S1. IMPRESSION: 1. Extremely subtle fracture involving the left superior pubic ramus and left pubic bone with superior and lateral cortical off step off. Despite the evidence of cortical disruption, a discrete fracture line is not visible. There is some stranding around the left superior pubic ramus and pubic bone compatible with edema/hemorrhage. 2. No left femoral neck fracture. There is soft tissue edema/contusion in the subcutaneous fat lateral to the left hip. No soft tissue or intramuscular hematoma. 3. 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 4. Aortic Atherosclerosis (ICD10-I70.0). Electronically Signed   By: Kennith Center M.D.   On: 06/25/2021 13:06   DG Chest Port 1 View  Result Date: 06/26/2021 CLINICAL DATA:  Hypoxia, COVID-19 infection EXAM: PORTABLE CHEST 1 VIEW COMPARISON:  02/24/2021 FINDINGS: Stable cardiomediastinal contours. Atherosclerotic calcification of the aortic knob. No focal airspace consolidation, pleural effusion, or pneumothorax. Degenerative changes of the bilateral shoulders. IMPRESSION: No acute cardiopulmonary  process. Electronically Signed   By: Duanne Guess D.O.   On: 06/26/2021 14:02   DG Shoulder Left  Result Date: 06/25/2021 CLINICAL DATA:  Tripped and fell yesterday.  Left shoulder pain. EXAM: LEFT SHOULDER - 2+ VIEW COMPARISON:  None. FINDINGS: No fracture or bone lesion. Glenohumeral and AC joints are normally aligned. Skeletal structures are demineralized. Soft tissues are unremarkable. IMPRESSION: No fracture or dislocation. Electronically Signed   By: Amie Portland M.D.   On: 06/25/2021 10:15   DG Hip Unilat W or Wo Pelvis 2-3 Views Left  Result Date: 06/25/2021 CLINICAL DATA:  Tripped and fell yesterday.  Left hip pain. EXAM: DG HIP (  WITH OR WITHOUT PELVIS) 2-3V LEFT COMPARISON:  None. FINDINGS: No fracture or bone lesion. Hip joints, SI joints and symphysis pubis are normally spaced and aligned. Soft tissues are unremarkable. IMPRESSION: No fracture or dislocation. Electronically Signed   By: Amie Portland M.D.   On: 06/25/2021 10:16     Assessment & Plan:   No problem-specific Assessment & Plan notes found for this encounter.     Glenford Bayley, NP 06/30/2021

## 2021-07-28 ENCOUNTER — Encounter: Payer: Self-pay | Admitting: Adult Health

## 2021-07-28 ENCOUNTER — Ambulatory Visit (INDEPENDENT_AMBULATORY_CARE_PROVIDER_SITE_OTHER): Payer: Medicare Other | Admitting: Adult Health

## 2021-07-28 ENCOUNTER — Other Ambulatory Visit: Payer: Self-pay

## 2021-07-28 DIAGNOSIS — J438 Other emphysema: Secondary | ICD-10-CM | POA: Diagnosis not present

## 2021-07-28 DIAGNOSIS — J310 Chronic rhinitis: Secondary | ICD-10-CM | POA: Diagnosis not present

## 2021-07-28 MED ORDER — BENZONATATE 100 MG PO CAPS: 100.0000 mg | ORAL_CAPSULE | Freq: Three times a day (TID) | ORAL | 3 refills | Status: DC | PRN

## 2021-07-28 NOTE — Patient Instructions (Signed)
Continue on Spiriva daily.  Continue on Singulair daily  Albuterol inhaler 2 puffs every 6hr as needed wheezing .  Activity as tolerated.  Tessalon Three times a day  As needed  cough  Follow up with Dr. Mortimer Fries or Gavyn Zoss NP in 1 year and As needed

## 2021-07-28 NOTE — Assessment & Plan Note (Signed)
Chronic bronchitis-currently well controlled. Influenza, COVID, pneumonia vaccines are up-to-date Activity as tolerated  Plan  Patient Instructions  Continue on Spiriva daily.  Continue on Singulair daily  Albuterol inhaler 2 puffs every 6hr as needed wheezing .  Activity as tolerated.  Tessalon Three times a day  As needed  cough  Follow up with Dr. Belia Heman or Lizabeth Fellner NP in 1 year and As needed

## 2021-07-28 NOTE — Progress Notes (Signed)
@Patient  ID: Sandy Eaton, female    DOB: Dec 23, 1940, 81 y.o.   MRN: [id number]  Chief Complaint  Patient presents with   Follow-up    Referring provider: [place], [facility]  HPI: 81 year old female former smoker followed for chronic  bronchitis  TEST/EVENTS :  06/22/15 FVC 3.20 (113%), FEV1 2.29 (108%), ratio 72  02/26/20 CTA- No PE. No edema or airspace opacity. Chronic scarring and atelectatic change in the inferior lingula. Stable apical scarring. No pleural effusions. No evident adenopathy.  07/28/2021 Follow up :Chronic Bronchitis  Patient returns for a 16-month follow-up.  Patient has underlying chronic bronchitis.  Patient says she is doing well overall.  She did have COVID-19 infection right after the Christmas holiday.  Says that she had symptoms for about 2 weeks.  She was treated with an antibiotic by her primary care provider.  Says symptoms resolved totally.  Patient says she takes Spiriva most days.  She denies any increased cough or congestion.  She says occasionally her cough will increase and she will use some Tessalon Perles if needed.  She does stay on Singulair says that overall her allergies are doing well.  She denies any increased use of her albuterol.  Patient did have a fall with a pelvic fracture.  She is currently having limited activity due to this.  But says that she is starting to do better. She denies any chest pain orthopnea PND hemoptysis or unintentional weight loss.      Allergies  Allergen Reactions   Augmentin [Amoxicillin-Pot Clavulanate] Nausea And Vomiting    Has patient had a PCN reaction causing immediate rash, facial/tongue/throat swelling, SOB or lightheadedness with hypotension: No Has patient had a PCN reaction causing severe rash involving mucus membranes or skin necrosis: No Has patient had a PCN reaction that required hospitalization: No Has patient had a PCN reaction occurring within the last 10 years: No If all of the above  answers are "NO", then may proceed with Cephalosporin use.    Escitalopram Oxalate Diarrhea   Valium [Diazepam] Other (See Comments)    Reaction:  Hallucinations    Wixela Inhub [Fluticasone-Salmeterol]     Thrush      Immunization History  Administered Date(s) Administered   Influenza Split 03/26/2013   Influenza, High Dose Seasonal PF 02/15/2016, 03/07/2017, 02/17/2019, 02/17/2019   Influenza, Seasonal, Injecte, Preservative Fre 03/18/2013   Influenza,inj,Quad PF,6+ Mos 05/06/2015   Influenza-Unspecified 03/31/2014, 05/06/2015, 02/16/2016   PFIZER Comirnaty(Gray Top)Covid-19 Tri-Sucrose Vaccine 07/22/2019, 08/12/2019, 04/09/2020   Pneumococcal Conjugate-13 01/26/2015   Pneumococcal Polysaccharide-23 02/22/2016, 01/04/2018   Tdap 02/10/2013   Zoster Recombinat (Shingrix) 04/02/2019, 07/03/2019   Zoster, Live 03/18/2014    Past Medical History:  Diagnosis Date   Asthma    Chronic asthmatic bronchitis (HCC)    COPD (chronic obstructive pulmonary disease) (HCC)    Suspected   Dyspnea    on exertion   Environmental allergies    GERD (gastroesophageal reflux disease)    Headache    migraine without aura, not intractable   Hyperlipidemia    Osteopenia    of lumbar spine   Pneumonia    3 times    Tobacco History: Social History   Tobacco Use  Smoking Status Former   Packs/day: 0.50   Years: 30.00   Pack years: 15.00   Types: Cigarettes   Quit date: 02/24/2009   Years since quitting: 12.4  Smokeless Tobacco Never   Counseling given: Not Answered   Outpatient Medications Prior to  Visit  Medication Sig Dispense Refill   acetaminophen (TYLENOL) 500 MG tablet Take 1,000 mg by mouth 2 (two) times daily as needed for mild pain or headache.     Albuterol Sulfate (PROAIR RESPICLICK) [id number] (90 Base) MCG/ACT AEPB Inhale 2 puffs into the lungs every 4 (four) hours as needed. (Patient taking differently: Inhale 2 puffs into the lungs every 4 (four) hours as needed (shortness of  breath or wheezing).) 1 each 3   aspirin EC 81 MG tablet Take 81 mg by mouth daily.     calcium-vitamin D (OSCAL WITH D) 500-5 MG-MCG tablet Take 1 tablet by mouth 2 (two) times daily. 60 tablet 0   cholecalciferol (VITAMIN D3) 25 MCG (1000 UNIT) tablet Take 1,000 Units by mouth daily.     gabapentin (NEURONTIN) 300 MG capsule Take 300 mg by mouth 2 (two) times daily.     ipratropium-albuterol (DUONEB) 0.5-2.5 (3) MG/3ML SOLN Take 3 mLs by nebulization 4 (four) times daily as needed for shortness of breath or wheezing.     montelukast (SINGULAIR) 10 MG tablet Take 1 tablet (10 mg total) by mouth at bedtime. 30 tablet 5   ondansetron (ZOFRAN) 4 MG tablet Take 1 tablet (4 mg total) by mouth every 6 (six) hours as needed for nausea. 20 tablet 0   sertraline (ZOLOFT) 50 MG tablet Take 50 mg by mouth daily.     Tiotropium Bromide Monohydrate (SPIRIVA RESPIMAT) 1.25 MCG/ACT AERS Inhale 2 puffs into the lungs daily. 4 g 5   vitamin B-12 (CYANOCOBALAMIN) 1000 MCG tablet Take 1,000 mcg by mouth daily.     benzonatate (TESSALON) 100 MG capsule Take 1 capsule by mouth three times daily as needed for cough 30 capsule 11   budesonide-formoterol (SYMBICORT) 160-4.5 MCG/ACT inhaler Inhale 2 puffs into the lungs 2 (two) times daily.     No facility-administered medications prior to visit.     Review of Systems:   Constitutional:   No  weight loss, night sweats,  Fevers, chills,  +fatigue, or  lassitude.  HEENT:   No headaches,  Difficulty swallowing,  Tooth/dental problems, or  Sore throat,                No sneezing, itching, ear ache, nasal congestion, post nasal drip,   CV:  No chest pain,  Orthopnea, PND, swelling in lower extremities, anasarca, dizziness, palpitations, syncope.   GI  No heartburn, indigestion, abdominal pain, nausea, vomiting, diarrhea, change in bowel habits, loss of appetite, bloody stools.   Resp: No shortness of breath with exertion or at rest.  No excess mucus, no productive  cough,  No non-productive cough,  No coughing up of blood.  No change in color of mucus.  No wheezing.  No chest wall deformity  Skin: no rash or lesions.  GU: no dysuria, change in color of urine, no urgency or frequency.  No flank pain, no hematuria   MS:  No joint pain or swelling.  No decreased range of motion.  No back pain.    Physical Exam  BP 120/78 (BP Location: Left Arm, Cuff Size: Normal)    Pulse 85    Temp 98.1 F (36.7 C) (Temporal)    Ht 5\' 3"  (1.6 m)    Wt 144 lb (65.3 kg)    SpO2 96%    BMI 25.51 kg/m   GEN: A/Ox3; pleasant , NAD, well nourished    HEENT:  [place]ettie/AT,   NOSE-clear, THROAT-clear, no lesions, no postnasal drip or  exudate noted.   NECK:  Supple w/ fair ROM; no JVD; normal carotid impulses w/o bruits; no thyromegaly or nodules palpated; no lymphadenopathy.    RESP  Clear  P & A; w/o, wheezes/ rales/ or rhonchi. no accessory muscle use, no dullness to percussion  CARD:  RRR, no m/r/g, no peripheral edema, pulses intact, no cyanosis or clubbing.  GI:   Soft & nt; nml bowel sounds; no organomegaly or masses detected.   Musco: Warm bil, no deformities or joint swelling noted.   Neuro: alert, no focal deficits noted.    Skin: Warm, no lesions or rashes    Lab Results:    BMET  BNP  No results found for: PROBNP  Imaging: No results found.    PFT Results Latest Ref Rng & Units 06/22/2015  FVC-Pre L 3.09  FVC-Predicted Pre % 110  FVC-Post L 3.20  FVC-Predicted Post % 113  Pre FEV1/FVC % % 74  Post FEV1/FCV % % 72  FEV1-Pre L 2.30  FEV1-Predicted Pre % 109  FEV1-Post L 2.29  DLCO uncorrected ml/min/mmHg 17.08  DLCO UNC% % 70  DLVA Predicted % 69    No results found for: NITRICOXIDE      Assessment & Plan:   COPD (chronic obstructive pulmonary disease) (HCC) Chronic bronchitis-currently well controlled. Influenza, COVID, pneumonia vaccines are up-to-date Activity as tolerated  Plan  Patient Instructions  Continue on Spiriva  daily.  Continue on Singulair daily  Albuterol inhaler 2 puffs every 6hr as needed wheezing .  Activity as tolerated.  Tessalon Three times a day  As needed  cough  Follow up with Dr. Mortimer Fries or Deckard Stuber NP in 1 year and As needed       Chronic rhinitis Compensated on present regimen.  No changes     Rexene Edison, NP 07/28/2021

## 2021-07-28 NOTE — Assessment & Plan Note (Signed)
Compensated on present regimen  No changes  

## 2021-08-08 ENCOUNTER — Other Ambulatory Visit: Payer: Self-pay | Admitting: Family Medicine

## 2021-08-08 DIAGNOSIS — I7789 Other specified disorders of arteries and arterioles: Secondary | ICD-10-CM

## 2021-09-01 ENCOUNTER — Ambulatory Visit
Admission: RE | Admit: 2021-09-01 | Discharge: 2021-09-01 | Disposition: A | Discharge status: Home / Self Care | Payer: Medicare Other | Source: Ambulatory Visit | Attending: Family Medicine | Admitting: Family Medicine

## 2021-09-01 DIAGNOSIS — I7789 Other specified disorders of arteries and arterioles: Secondary | ICD-10-CM | POA: Insufficient documentation

## 2021-09-01 LAB — POCT I-STAT CREATININE: Creatinine, Ser: 0.6 mg/dL (ref 0.44–1.00)

## 2021-09-01 MED ORDER — IOHEXOL 350 MG/ML SOLN
75.0000 mL | Freq: Once | INTRAVENOUS | Status: AC | PRN
  Administered 2021-09-01: 75 mL via INTRAVENOUS

## 2021-09-06 ENCOUNTER — Ambulatory Visit
Admission: RE | Admit: 2021-09-06 | Discharge: 2021-09-06 | Disposition: A | Discharge status: Home / Self Care | Payer: Medicare Other | Source: Ambulatory Visit | Attending: Family Medicine | Admitting: Family Medicine

## 2021-09-06 ENCOUNTER — Other Ambulatory Visit: Payer: Self-pay | Admitting: Family Medicine

## 2021-09-06 ENCOUNTER — Ambulatory Visit
Admission: RE | Admit: 2021-09-06 | Discharge: 2021-09-06 | Disposition: A | Discharge status: Home / Self Care | Payer: Medicare Other | Attending: Family Medicine | Admitting: Family Medicine

## 2021-09-06 DIAGNOSIS — J440 Chronic obstructive pulmonary disease with acute lower respiratory infection: Secondary | ICD-10-CM

## 2021-10-10 ENCOUNTER — Encounter: Payer: Self-pay | Admitting: General Surgery

## 2021-10-11 ENCOUNTER — Ambulatory Visit
Admission: RE | Admit: 2021-10-11 | Discharge: 2021-10-11 | Disposition: A | Discharge status: Home / Self Care | Payer: Medicare Other | Attending: General Surgery | Admitting: General Surgery

## 2021-10-11 ENCOUNTER — Ambulatory Visit: Payer: Medicare Other | Admitting: Certified Registered"

## 2021-10-11 ENCOUNTER — Ambulatory Visit: Admission: RE | Admit: 2021-10-11 | Payer: Medicare Other | Source: Home / Self Care | Admitting: General Surgery

## 2021-10-11 ENCOUNTER — Encounter: Admission: RE | Payer: Self-pay | Source: Home / Self Care

## 2021-10-11 ENCOUNTER — Encounter: Payer: Self-pay | Admitting: General Surgery

## 2021-10-11 ENCOUNTER — Encounter
Admission: RE | Disposition: A | Discharge status: Home / Self Care | Payer: Self-pay | Source: Home / Self Care | Attending: General Surgery

## 2021-10-11 DIAGNOSIS — J449 Chronic obstructive pulmonary disease, unspecified: Secondary | ICD-10-CM | POA: Diagnosis not present

## 2021-10-11 DIAGNOSIS — K2950 Unspecified chronic gastritis without bleeding: Secondary | ICD-10-CM | POA: Insufficient documentation

## 2021-10-11 DIAGNOSIS — Z8711 Personal history of peptic ulcer disease: Secondary | ICD-10-CM | POA: Insufficient documentation

## 2021-10-11 DIAGNOSIS — R1310 Dysphagia, unspecified: Secondary | ICD-10-CM | POA: Insufficient documentation

## 2021-10-11 DIAGNOSIS — Z87891 Personal history of nicotine dependence: Secondary | ICD-10-CM | POA: Diagnosis not present

## 2021-10-11 DIAGNOSIS — K219 Gastro-esophageal reflux disease without esophagitis: Secondary | ICD-10-CM | POA: Diagnosis not present

## 2021-10-11 HISTORY — DX: Depression, unspecified: F32.A

## 2021-10-11 HISTORY — DX: Unspecified osteoarthritis, unspecified site: M19.90

## 2021-10-11 HISTORY — PX: ESOPHAGOGASTRODUODENOSCOPY: SHX5428

## 2021-10-11 HISTORY — DX: Unspecified glaucoma: H40.9

## 2021-10-11 SURGERY — EGD (ESOPHAGOGASTRODUODENOSCOPY)
Anesthesia: Monitor Anesthesia Care

## 2021-10-11 SURGERY — ESOPHAGOGASTRODUODENOSCOPY (EGD) WITH PROPOFOL
Anesthesia: General

## 2021-10-11 MED ORDER — LIDOCAINE HCL (CARDIAC) PF 100 MG/5ML IV SOSY
PREFILLED_SYRINGE | INTRAVENOUS | Status: DC | PRN
  Administered 2021-10-11: 100 mg via INTRAVENOUS

## 2021-10-11 MED ORDER — SODIUM CHLORIDE 0.9 % IV SOLN: INTRAVENOUS | Status: DC

## 2021-10-11 MED ORDER — PROPOFOL 10 MG/ML IV BOLUS
INTRAVENOUS | Status: DC | PRN
  Administered 2021-10-11 (×2): 40 mg via INTRAVENOUS
  Administered 2021-10-11: 80 mg via INTRAVENOUS

## 2021-10-11 NOTE — Anesthesia Preprocedure Evaluation (Signed)
Anesthesia Evaluation  ?Patient identified by MRN, date of birth, ID band ?Patient awake ? ? ? ?Reviewed: ?Allergy & Precautions, NPO status , Patient's Chart, lab work & pertinent test results ? ?History of Anesthesia Complications ?Negative for: history of anesthetic complications ? ?Airway ?Mallampati: II ? ?TM Distance: >3 FB ?Neck ROM: Full ? ? ? Dental ?no notable dental hx. ?(+) Edentulous Upper, Edentulous Lower ?  ?Pulmonary ?asthma , COPD,  COPD inhaler, former smoker,  ?Takes daily spireva, takes nebulizers once per day (took nebulizer this morning 0 ?  ?Pulmonary exam normal ?breath sounds clear to auscultation- rhonchi ?(-) wheezing ? ? ? ? ? Cardiovascular ?Exercise Tolerance: Good ?(-) hypertension+ DOE  ?(-) CAD, (-) Past MI, (-) Cardiac Stents and (-) CABG  ?Rhythm:Regular Rate:Normal ?- Systolic murmurs and - Diastolic murmurs ? ?  ?Neuro/Psych ? Headaches, neg Seizures PSYCHIATRIC DISORDERS Depression   ? GI/Hepatic ?Neg liver ROS, GERD  Medicated and Controlled,  ?Endo/Other  ?negative endocrine ROSneg diabetes ? Renal/GU ?negative Renal ROS  ? ?  ?Musculoskeletal ?negative musculoskeletal ROS ?(+)  ? Abdominal ?(+) - obese,   ?Peds ? Hematology ?negative hematology ROS ?(+)   ?Anesthesia Other Findings ?Past Medical History: ?No date: Asthma ?No date: Chronic asthmatic bronchitis (HCC) ?No date: COPD (chronic obstructive pulmonary disease) (HCC) ?    Comment:  Suspected ?No date: Dyspnea ?    Comment:  on exertion ?No date: Environmental allergies ?No date: GERD (gastroesophageal reflux disease) ?No date: Headache ?    Comment:  migraine without aura, not intractable ?No date: Hyperlipidemia ?No date: Osteopenia ?    Comment:  of lumbar spine ?No date: Pneumonia ?    Comment:  3 times ? ? Reproductive/Obstetrics ? ?  ? ? ? ? ? ? ? ? ? ? ? ? ? ?  ?  ? ? ? ? ? ? ? ? ?Anesthesia Physical ? ?Anesthesia Plan ? ?ASA: 3 ? ?Anesthesia Plan: General  ? ?Post-op Pain  Management: Minimal or no pain anticipated  ? ?Induction: Intravenous ? ?PONV Risk Score and Plan: 3 and Propofol infusion ? ?Airway Management Planned: Natural Airway ? ?Additional Equipment: None ? ?Intra-op Plan:  ? ?Post-operative Plan:  ? ?Informed Consent: I have reviewed the patients History and Physical, chart, labs and discussed the procedure including the risks, benefits and alternatives for the proposed anesthesia with the patient or authorized representative who has indicated his/her understanding and acceptance.  ? ? ? ?Dental advisory given ? ?Plan Discussed with: CRNA and Anesthesiologist ? ?Anesthesia Plan Comments: (Discussed risks of anesthesia with patient, including possibility of difficulty with spontaneous ventilation under anesthesia necessitating airway intervention, PONV, and rare risks such as cardiac or respiratory or neurological events, and allergic reactions. Discussed the role of CRNA in patient's perioperative care. Patient understands.)  ? ? ? ? ? ? ?Anesthesia Quick Evaluation ? ?

## 2021-10-11 NOTE — H&P (Signed)
Sandy Eaton ?[id number] ?[birth year] ? ?  ? ?HPI:  Healthy 81 y/o woman with a 1 year history of intermittent dysphagia.  Rare episodes where thick, roapy mucous will be present, more often she has a sense of food sticking that slowly passes.  Solids give her more trouble. No difficulty with liquids. HX PUD > 20 years ago when she was living in [place].  ? ?Medications Prior to Admission  ?Medication Sig Dispense Refill Last Dose  ? acetaminophen (TYLENOL) 500 MG tablet Take 1,000 mg by mouth 2 (two) times daily as needed for mild pain or headache.   10/11/2021  ? Albuterol Sulfate (PROAIR RESPICLICK) 108 (90 Base) MCG/ACT AEPB Inhale 2 puffs into the lungs every 4 (four) hours as needed. (Patient taking differently: Inhale 2 puffs into the lungs every 4 (four) hours as needed (shortness of breath or wheezing).) 1 each 3 10/11/2021  ? aspirin EC 81 MG tablet Take 81 mg by mouth daily.   10/10/2021  ? calcium-vitamin D (OSCAL WITH D) 500-5 MG-MCG tablet Take 1 tablet by mouth 2 (two) times daily. 60 tablet 0 10/10/2021  ? cetirizine (ZYRTEC) 5 MG tablet Take 5 mg by mouth daily.   10/10/2021  ? cholecalciferol (VITAMIN D3) 25 MCG (1000 UNIT) tablet Take 1,000 Units by mouth daily.   10/10/2021  ? gabapentin (NEURONTIN) 300 MG capsule Take 300 mg by mouth 2 (two) times daily.   10/10/2021  ? montelukast (SINGULAIR) 10 MG tablet Take 1 tablet (10 mg total) by mouth at bedtime. 30 tablet 5 10/10/2021  ? sertraline (ZOLOFT) 50 MG tablet Take 50 mg by mouth daily.   10/10/2021  ? Tiotropium Bromide Monohydrate (SPIRIVA RESPIMAT) 1.25 MCG/ACT AERS Inhale 2 puffs into the lungs daily. 4 g 5 10/10/2021  ? vitamin B-12 (CYANOCOBALAMIN) 1000 MCG tablet Take 1,000 mcg by mouth daily.   10/10/2021  ? benzonatate (TESSALON) 100 MG capsule Take 1 capsule (100 mg total) by mouth 3 (three) times daily as needed for cough. 30 capsule 3   ? ipratropium-albuterol (DUONEB) 0.5-2.5 (3) MG/3ML SOLN Take 3 mLs by nebulization 4 (four) times  daily as needed for shortness of breath or wheezing.     ? ondansetron (ZOFRAN) 4 MG tablet Take 1 tablet (4 mg total) by mouth every 6 (six) hours as needed for nausea. 20 tablet 0   ? ?Allergies  ?Allergen Reactions  ? Augmentin [Amoxicillin-Pot Clavulanate] Nausea And Vomiting  ?  Has patient had a PCN reaction causing immediate rash, facial/tongue/throat swelling, SOB or lightheadedness with hypotension: No ?Has patient had a PCN reaction causing severe rash involving mucus membranes or skin necrosis: No ?Has patient had a PCN reaction that required hospitalization: No ?Has patient had a PCN reaction occurring within the last 10 years: No ?If all of the above answers are "NO", then may proceed with Cephalosporin use. ?  ? Escitalopram Oxalate Diarrhea  ? Valium [Diazepam] Other (See Comments)  ?  Reaction:  Hallucinations   ? Wixela Inhub [Fluticasone-Salmeterol]   ?  Thrush    ? ?Past Medical History:  ?Diagnosis Date  ? Arthritis   ? Asthma   ? Chronic asthmatic bronchitis (HCC)   ? COPD (chronic obstructive pulmonary disease) (HCC)   ? Suspected  ? Depression   ? Dyspnea   ? on exertion  ? Environmental allergies   ? GERD (gastroesophageal reflux disease)   ? Glaucoma   ? Headache   ? migraine without aura, not intractable  ?  Hyperlipidemia   ? Osteopenia   ? of lumbar spine  ? Pneumonia   ? 3 times  ? ?Past Surgical History:  ?Procedure Laterality Date  ? AMPUTATION TOE    ? APPENDECTOMY    ? COLONOSCOPY WITH PROPOFOL N/A 08/19/2020  ? Procedure: COLONOSCOPY WITH PROPOFOL;  Surgeon: Regis Bill, MD;  Location[facility] ENDOSCOPY;  Service: Endoscopy;  Laterality: N/A;  [facility] COVID POSITIVE TEST - ATTACHED TO CHART  ? ESOPHAGOGASTRODUODENOSCOPY (EGD) WITH PROPOFOL N/A 08/19/2020  ? Procedure: ESOPHAGOGASTRODUODENOSCOPY (EGD) WITH PROPOFOL;  Surgeon: Regis Bill, MD;  Location: [facility] ENDOSCOPY;  Service: Endoscopy;  Laterality: N/A;  ? FEMORAL HERNIA REPAIR Right 01/15/2017  ? Procedure: HERNIA  REPAIR FEMORAL;  Surgeon: Nadeen Landau, MD;  Location: [facility] ORS;  Service: General;  Laterality: Right;  ? HERNIA REPAIR Right   ? ROTATOR CUFF REPAIR Right   ? TONSILLECTOMY    ? VAGINAL HYSTERECTOMY    ? 1960's  ? ?Social History  ? ?Socioeconomic History  ? Marital status: Single  ?  Spouse name: Not on file  ? Number of children: Not on file  ? Years of education: Not on file  ? Highest education level: Not on file  ?Occupational History  ? Not on file  ?Tobacco Use  ? Smoking status: Former  ?  Packs/day: 0.50  ?  Years: 30.00  ?  Pack years: 15.00  ?  Types: Cigarettes  ?  Quit date: 02/24/2009  ?  Years since quitting: 12.6  ? Smokeless tobacco: Never  ?Vaping Use  ? Vaping Use: Never used  ?Substance and Sexual Activity  ? Alcohol use: No  ? Drug use: No  ? Sexual activity: Not on file  ?Other Topics Concern  ? Not on file  ?Social History Narrative  ? Not on file  ? ?Social Determinants of Health  ? ?Financial Resource Strain: Not on file  ?Food Insecurity: Not on file  ?Transportation Needs: Not on file  ?Physical Activity: Not on file  ?Stress: Not on file  ?Social Connections: Not on file  ?Intimate Partner Violence: Not on file  ? ?Social History  ? ?Social History Narrative  ? Not on file  ? ? ? ?ROS: Negative.  ? ? ? ?PE: ?HEENT: Negative. ?Lungs: Clear. ?Cardio: RR. ? ? ?Assessment/Plan: ? ?Proceed with planned endoscopy.  ? ? ?Merrily Pew Deerica Waszak ?10/11/2021 ? ?  ?

## 2021-10-11 NOTE — Op Note (Signed)
[facility] ?Gastroenterology ?Patient Name: Sandy Eaton ?Procedure Date: 10/11/2021 10:14 AM ?MRN: [id number] ?Account #: [id number] ?Date of Birth: 11-11-1940 ?Admit Type: Outpatient ?Age: 81 ?Room: [facility] ENDO ROOM 1 ?Gender: Female ?Note Status: Finalized ?Instrument Name: Upper Endoscope [id number] ?Procedure:             Upper GI endoscopy ?Indications:           Dysphagia ?Providers:             Earline Mayotte, MD ?Referring MD:          [facility] [place] (Referring MD) ?Medicines:             Propofol per Anesthesia ?Complications:         No immediate complications. ?Procedure:             Pre-Anesthesia Assessment: ?                       - Prior to the procedure, a History and Physical was  ?                       performed, and patient medications, allergies and  ?                       sensitivities were reviewed. The patient's tolerance  ?                       of previous anesthesia was reviewed. ?                       - The risks and benefits of the procedure and the  ?                       sedation options and risks were discussed with the  ?                       patient. All questions were answered and informed  ?                       consent was obtained. ?                       After obtaining informed consent, the endoscope was  ?                       passed under direct vision. Throughout the procedure,  ?                       the patient's blood pressure, pulse, and oxygen  ?                       saturations were monitored continuously. The Endoscope  ?                       was introduced through the mouth, and advanced to the  ?                       second part of duodenum. The upper GI endoscopy was  ?  accomplished without difficulty. The patient tolerated  ?                       the procedure well. ?Findings: ?     The esophagus was normal. ?     The examined duodenum was normal. ?     Diffuse mild inflammation characterized by  erythema was found in the  ?     prepyloric region of the stomach. Biopsies were taken with a cold  ?     forceps for histology. ?Impression:            - Normal esophagus. ?                       - Normal examined duodenum. ?                       - Chronic gastritis. Biopsied. ?Recommendation:        - Telephone endoscopist for pathology results in 1  ?                       week. ?Procedure Code(s):     --- Professional --- ?                       [phone], Esophagogastroduodenoscopy, flexible,  ?                       transoral; with biopsy, single or multiple ?Diagnosis Code(s):     --- Professional --- ?                       K29.50, Unspecified chronic gastritis without bleeding ?                       R13.10, Dysphagia, unspecified ?CPT copyright 2019 [employer] Medical Association. All rights reserved. ?The codes documented in this report are preliminary and upon coder review may  ?be revised to meet current compliance requirements. ?Earline Mayotte, MD ?10/11/2021 10:43:05 AM ?This report has been signed electronically. ?Number of Addenda: 0 ?Note Initiated On: 10/11/2021 10:14 AM ?Estimated Blood Loss:  Estimated blood loss: none. ?     [facility] ?

## 2021-10-11 NOTE — Anesthesia Postprocedure Evaluation (Signed)
Anesthesia Post Note ? ?Patient: Sandy Eaton ? ?Procedure(s) Performed: ESOPHAGOGASTRODUODENOSCOPY (EGD) ? ?Patient location during evaluation: Endoscopy ?Anesthesia Type: MAC ?Level of consciousness: awake and alert ?Pain management: pain level controlled ?Vital Signs Assessment: post-procedure vital signs reviewed and stable ?Respiratory status: spontaneous breathing, nonlabored ventilation, respiratory function stable and patient connected to nasal cannula oxygen ?Cardiovascular status: blood pressure returned to baseline and stable ?Postop Assessment: no apparent nausea or vomiting ?Anesthetic complications: no ? ? ?No notable events documented. ? ? ?Last Vitals:  ?Vitals:  ? 10/11/21 1100 10/11/21 1110  ?BP: 101/71 92/62  ?Pulse: 71 71  ?Resp: 20 (!) 24  ?Temp:    ?SpO2: 93% 91%  ?  ?Last Pain:  ?Vitals:  ? 10/11/21 1040  ?TempSrc: Temporal  ?PainSc:   ? ? ?  ?  ?  ?  ?  ?  ? ?Arita Miss ? ? ? ? ?

## 2021-10-11 NOTE — Transfer of Care (Signed)
Immediate Anesthesia Transfer of Care Note ? ?Patient: Sandy Eaton ? ?Procedure(s) Performed: ESOPHAGOGASTRODUODENOSCOPY (EGD) ? ?Patient Location: Endoscopy Unit ? ?Anesthesia Type:MAC ? ?Level of Consciousness: awake, alert  and oriented ? ?Airway & Oxygen Therapy: Patient Spontanous Breathing and Patient connected to nasal cannula oxygen ? ?Post-op Assessment: Report given to RN, Post -op Vital signs reviewed and stable and Patient moving all extremities ? ?Post vital signs: Reviewed and stable ? ?Last Vitals:  ?Vitals Value Taken Time  ?BP 110/70 10/11/21 1049  ?Temp    ?Pulse 80 10/11/21 1049  ?Resp 24 10/11/21 1049  ?SpO2 94 % 10/11/21 1049  ? ? ?Last Pain:  ?Vitals:  ? 10/11/21 1040  ?TempSrc: Temporal  ?PainSc:   ?   ? ?  ? ?Complications: No notable events documented. ?

## 2021-10-12 ENCOUNTER — Encounter: Payer: Self-pay | Admitting: General Surgery

## 2021-10-12 LAB — SURGICAL PATHOLOGY

## 2021-10-13 ENCOUNTER — Telehealth: Payer: Self-pay | Admitting: Adult Health

## 2021-10-13 NOTE — Telephone Encounter (Signed)
[facility] patient line was busy, will call back ?

## 2021-10-17 NOTE — Telephone Encounter (Signed)
Called pt's home number and got busy tone  ?I called her mobile number and there was no answer- LMTCB.  ?

## 2021-10-30 ENCOUNTER — Other Ambulatory Visit: Payer: Self-pay | Admitting: Gastroenterology

## 2021-10-30 DIAGNOSIS — R1314 Dysphagia, pharyngoesophageal phase: Secondary | ICD-10-CM

## 2021-11-23 ENCOUNTER — Other Ambulatory Visit: Payer: Self-pay | Admitting: Primary Care

## 2021-12-14 NOTE — Telephone Encounter (Signed)
Since calling our office 4/28, pt also called her PCP office and meds were handled by PCP. Nothing further needed.

## 2022-08-03 ENCOUNTER — Ambulatory Visit
Admission: EM | Admit: 2022-08-03 | Discharge: 2022-08-03 | Disposition: A | Discharge status: Home / Self Care | Payer: 59

## 2022-08-03 ENCOUNTER — Ambulatory Visit (INDEPENDENT_AMBULATORY_CARE_PROVIDER_SITE_OTHER): Payer: 59

## 2022-08-03 DIAGNOSIS — R051 Acute cough: Secondary | ICD-10-CM

## 2022-08-03 MED ORDER — HYDROCOD POLI-CHLORPHE POLI ER 10-8 MG/5ML PO SUER
5.0000 mL | Freq: Two times a day (BID) | ORAL | 0 refills | Status: DC | PRN
Start: 2022-08-03 — End: 2023-04-20

## 2022-08-03 MED ORDER — BENZONATATE 100 MG PO CAPS
100.0000 mg | ORAL_CAPSULE | Freq: Three times a day (TID) | ORAL | 1 refills | Status: DC | PRN
Start: 2022-08-03 — End: 2023-04-11

## 2022-08-03 MED ORDER — PREDNISONE 20 MG PO TABS: 40.0000 mg | ORAL_TABLET | Freq: Every day | ORAL | 0 refills | Status: DC

## 2022-08-03 NOTE — Discharge Instructions (Addendum)
Chest x-ray is negative  Begin prednisone every morning with food for 5 days to help reduce inflammation which may lessen your cough  May use Tussionex every 12 hours as needed to help with cough, be mindful that codeine can make you feel drowsy  May continue use of Tessalon pill, may take 1 to 2 tablets every 8 hours as needed, 1 refill given on medication  At this time I would recommend follow-up with your pulmonologist for further management of your symptoms

## 2022-08-03 NOTE — ED Provider Notes (Signed)
[facility]    CSN: [id number] Arrival date & time: 08/03/22  1534      History   Chief Complaint Chief Complaint  Patient presents with   Cough    HPI Sandy Eaton is a 82 y.o. female.   Patient presents for evaluation of shortness of breath with exertion and a productive cough with Sandy Eaton sputum present for at least 1 month.  Has been evaluated by her PCP 3 times, last round of antibiotics and steroids at least 2 weeks ago.  Daughter endorses that congestion has improved but cough is persisting and is harsh.  Cough interfering with sleep overnight with episodes lasting approximately 10 minutes before resolution.  Experiencing intermittent wheezing.  Experiencing chest and back soreness with pain to the bilateral ears and the throat from coughing as well.  Decreased appetite but tolerating some food and fluids.  No known sick contacts.  History of COPD, bronchitis, using ProAir and Spiriva, former smoker.  Denies fevers.  Additionally has attempted use of Tessalon, Tylenol and Allegra.    Past Medical History:  Diagnosis Date   Arthritis    Asthma    Chronic asthmatic bronchitis    COPD (chronic obstructive pulmonary disease) (HCC)    Suspected   Depression    Dyspnea    on exertion   Environmental allergies    GERD (gastroesophageal reflux disease)    Glaucoma    Headache    migraine without aura, not intractable   Hyperlipidemia    Osteopenia    of lumbar spine   Pneumonia    3 times    Patient Active Problem List   Diagnosis Date Noted   Chronic rhinitis 07/28/2021   Multiple closed stable fractures of pubic ramus ([place]) 06/26/2021   Multiple closed pelvic fractures without disruption of pelvic circle (HCC)    Nausea    COVID-19 virus infection    Hypoxia    Neuropathy    Pubic ramus fracture ([place]) 06/25/2021   GERD (gastroesophageal reflux disease) 06/25/2021   COPD (chronic obstructive pulmonary disease) ([place]) 01/02/2019   Pneumonia  05/05/2015   Hospital acquired PNA 11/26/2014   DOE (dyspnea on exertion) 02/24/2014   Cough 02/24/2014    Past Surgical History:  Procedure Laterality Date   AMPUTATION TOE     APPENDECTOMY     COLONOSCOPY WITH PROPOFOL N/A 08/19/2020   Procedure: COLONOSCOPY WITH PROPOFOL;  Surgeon: Lesly Rubenstein, MD;  Location: [facility] ENDOSCOPY;  Service: Endoscopy;  Laterality: N/A;  [facility] COVID POSITIVE TEST - ATTACHED TO CHART   ESOPHAGOGASTRODUODENOSCOPY N/A 10/11/2021   Procedure: ESOPHAGOGASTRODUODENOSCOPY (EGD);  Surgeon: Robert Bellow, MD;  Location: [facility] ENDOSCOPY;  Service: Endoscopy;  Laterality: N/A;   ESOPHAGOGASTRODUODENOSCOPY (EGD) WITH PROPOFOL N/A 08/19/2020   Procedure: ESOPHAGOGASTRODUODENOSCOPY (EGD) WITH PROPOFOL;  Surgeon: Lesly Rubenstein, MD;  Location: [facility] ENDOSCOPY;  Service: Endoscopy;  Laterality: N/A;   FEMORAL HERNIA REPAIR Right 01/15/2017   Procedure: HERNIA REPAIR FEMORAL;  Surgeon: Leonie Green, MD;  Location: [facility] ORS;  Service: General;  Laterality: Right;   HERNIA REPAIR Right    ROTATOR CUFF REPAIR Right    TONSILLECTOMY     VAGINAL HYSTERECTOMY     1960's    OB History     Gravida  3   Para      Term      Preterm      AB      Living  2      SAB  IAB      Ectopic      Multiple      Live Births               Home Medications    Prior to Admission medications   Medication Sig Start Date End Date Taking? Authorizing Provider  acetaminophen (TYLENOL) 500 MG tablet Take 1,000 mg by mouth 2 (two) times daily as needed for mild pain or headache.   Yes [provider]  Albuterol Sulfate (PROAIR RESPICLICK) 123XX123 (90 Base) MCG/ACT AEPB Inhale 2 puffs into the lungs every 4 (four) hours as needed. Patient taking differently: Inhale 2 puffs into the lungs every 4 (four) hours as needed (shortness of breath or wheezing). 09/27/20  Yes Martyn Ehrich, NP  aspirin EC 81 MG tablet Take 81 mg by mouth daily.    Yes [provider]  benzonatate (TESSALON) 100 MG capsule Take 1 capsule (100 mg total) by mouth 3 (three) times daily as needed for cough. 07/28/21  Yes Parrett, Tammy S, NP  calcium-vitamin D (OSCAL WITH D) 500-5 MG-MCG tablet Take 1 tablet by mouth 2 (two) times daily. 06/27/21  Yes Wieting, Richard, MD  cholecalciferol (VITAMIN D3) 25 MCG (1000 UNIT) tablet Take 1,000 Units by mouth daily.   Yes [provider]  gabapentin (NEURONTIN) 300 MG capsule Take 300 mg by mouth 2 (two) times daily.   Yes [provider]  ipratropium-albuterol (DUONEB) 0.5-2.5 (3) MG/3ML SOLN Take 3 mLs by nebulization 4 (four) times daily as needed for shortness of breath or wheezing.   Yes [provider]  levocetirizine (XYZAL) 5 MG tablet Take by mouth. 04/17/22  Yes [provider]  montelukast (SINGULAIR) 10 MG tablet Take 1 tablet (10 mg total) by mouth at bedtime. 09/27/20  Yes Martyn Ehrich, NP  sertraline (ZOLOFT) 50 MG tablet Take 50 mg by mouth daily. 07/23/19  Yes [provider]  Tiotropium Bromide Monohydrate (SPIRIVA RESPIMAT) 1.25 MCG/ACT AERS INHALE 2 SPRAY(S) BY MOUTH ONCE DAILY 11/24/21  Yes Martyn Ehrich, NP  vitamin B-12 (CYANOCOBALAMIN) 1000 MCG tablet Take 1,000 mcg by mouth daily.   Yes [provider]  cetirizine (ZYRTEC) 5 MG tablet Take 5 mg by mouth daily.    [provider]  ondansetron (ZOFRAN) 4 MG tablet Take 1 tablet (4 mg total) by mouth every 6 (six) hours as needed for nausea. 06/27/21   Loletha Grayer, MD    Family History Family History  Problem Relation Age of Onset   COPD Father    Asthma Daughter    Asthma Grandchild    Cancer Daughter        breast   Breast cancer Daughter    Other Mother        MVA    Social History Social History   Tobacco Use   Smoking status: Former    Packs/day: 0.50    Years: 30.00    Total pack years: 15.00    Types: Cigarettes    Quit date: 02/24/2009    Years  since quitting: 13.4   Smokeless tobacco: Never  Vaping Use   Vaping Use: Never used  Substance Use Topics   Alcohol use: No   Drug use: No     Allergies   Doxycycline, Fluticasone-salmeterol, Amoxicillin-pot clavulanate, Diazepam, and Escitalopram oxalate   Review of Systems Review of Systems  Constitutional: Negative.   HENT:  Positive for congestion and rhinorrhea. Negative for dental problem, drooling, ear discharge, ear  pain, facial swelling, hearing loss, mouth sores, nosebleeds, postnasal drip, sinus pressure, sinus pain, sneezing, sore throat, tinnitus, trouble swallowing and voice change.   Respiratory:  Positive for cough, shortness of breath and wheezing. Negative for apnea, choking, chest tightness and stridor.   Cardiovascular: Negative.      Physical Exam Triage Vital Signs ED Triage Vitals  Enc Vitals Group     BP 08/03/22 1548 115/77     Pulse Rate 08/03/22 1548 86     Resp --      Temp 08/03/22 1548 98 F (36.7 C)     Temp Source 08/03/22 1548 Oral     SpO2 08/03/22 1548 93 %     Weight 08/03/22 1543 143 lb (64.9 kg)     Height 08/03/22 1543 5' 3"$  (1.6 m)     Head Circumference --      Peak Flow --      Pain Score 08/03/22 1543 9     Pain Loc --      Pain Edu? --      Excl. in [place]? --    No data found.  Updated Vital Signs BP 115/77 (BP Location: Left Arm)   Pulse 86   Temp 98 F (36.7 C) (Oral)   Ht 5' 3"$  (1.6 m)   Wt 143 lb (64.9 kg)   SpO2 93%   BMI 25.33 kg/m   Visual Acuity Right Eye Distance:   Left Eye Distance:   Bilateral Distance:    Right Eye Near:   Left Eye Near:    Bilateral Near:     Physical Exam Constitutional:      Appearance: Normal appearance.  HENT:     Head: Normocephalic.     Right Ear: Tympanic membrane, ear canal and external ear normal.     Left Ear: Tympanic membrane, ear canal and external ear normal.     Nose: Congestion and rhinorrhea present.     Mouth/Throat:     Mouth: Mucous membranes are  moist.     Pharynx: No posterior oropharyngeal erythema.  Eyes:     Extraocular Movements: Extraocular movements intact.  Cardiovascular:     Rate and Rhythm: Normal rate and regular rhythm.     Pulses: Normal pulses.     Heart sounds: Normal heart sounds.  Pulmonary:     Effort: Pulmonary effort is normal.     Breath sounds: Normal breath sounds.     Comments: Harsh persistent cough witnessed in exam Skin:    General: Skin is warm and dry.  Neurological:     Mental Status: She is alert and oriented to person, place, and time. Mental status is at baseline.      UC Treatments / Results  Labs (all labs ordered are listed, but only abnormal results are displayed) Labs Reviewed - No data to display  EKG   Radiology No results found.  Procedures Procedures (including critical care time)  Medications Ordered in UC Medications - No data to display  Initial Impression / Assessment and Plan / UC Course  I have reviewed the triage vital signs and the nursing notes.  Pertinent labs & imaging results that were available during my care of the patient were reviewed by me and considered in my medical decision making (see chart for details)  Acute cough  Vital signs are stable, O2 saturation greater than 90%, lungs are clear to auscultation even though harsh cough weakness, x-rays negative, discussed all findings with patient and door to  her, will defer use of antibiotics at this time, prescribed prednisone burst as well as Intestinex, his use narcotics within the last year per PDMP review, tolerated well, low risk, refilled Tessalon, may continue use of additional over-the-counter medications, recommended follow-up with pulmonology for reevaluation  Final Clinical Impressions(s) / UC Diagnoses   Final diagnoses:  None   Discharge Instructions   None    ED Prescriptions   None    PDMP not reviewed this encounter.   Hans Eden, NP 08/03/22 1640

## 2022-08-03 NOTE — ED Triage Notes (Signed)
Pt is with her daughter  Pt c/o productive cough, SOB x66month Pt states that the cough causes her face and chest to become sore

## 2022-08-27 ENCOUNTER — Encounter: Payer: Self-pay | Admitting: Emergency Medicine

## 2022-08-27 ENCOUNTER — Ambulatory Visit
Admission: EM | Admit: 2022-08-27 | Discharge: 2022-08-27 | Disposition: A | Discharge status: Home / Self Care | Payer: 59 | Attending: Emergency Medicine | Admitting: Emergency Medicine

## 2022-08-27 DIAGNOSIS — J449 Chronic obstructive pulmonary disease, unspecified: Secondary | ICD-10-CM | POA: Insufficient documentation

## 2022-08-27 DIAGNOSIS — N3000 Acute cystitis without hematuria: Secondary | ICD-10-CM | POA: Insufficient documentation

## 2022-08-27 LAB — URINALYSIS, W/ REFLEX TO CULTURE (INFECTION SUSPECTED)
Bilirubin Urine: NEGATIVE
Glucose, UA: NEGATIVE mg/dL
Ketones, ur: NEGATIVE mg/dL
Nitrite: POSITIVE — AB
Specific Gravity, Urine: 1.01 (ref 1.005–1.030)
pH: 6 (ref 5.0–8.0)

## 2022-08-27 MED ORDER — PREDNISONE 10 MG (21) PO TBPK: ORAL_TABLET | Freq: Every day | ORAL | 0 refills | Status: DC

## 2022-08-27 MED ORDER — CEPHALEXIN 500 MG PO CAPS: 500.0000 mg | ORAL_CAPSULE | Freq: Two times a day (BID) | ORAL | 0 refills | Status: AC

## 2022-08-27 NOTE — ED Triage Notes (Signed)
Pt presents with a cough, chest congestion and SOB for several weeks. She was last seen here on 08/03/22 and its not better.  She also states her urine was dark and she has some lower abdominal pain.

## 2022-08-27 NOTE — ED Provider Notes (Signed)
[facility]    CSN: [id number] Arrival date & time: 08/27/22  1207      History   Chief Complaint Chief Complaint  Patient presents with   Cough   Hematuria    HPI Sandy Eaton is a 82 y.o. female.   Patient presents for evaluation of persisting nasal congestion, rhinorrhea shortness of breath with exertion and a productive cough with Sandy Eaton sputum present for at least 2 months.  Last evaluated in urgent care for similar symptoms on 08/03/2022.  Has been evaluated by her PCP 3 times, last round of antibiotics and steroids at least 2 weeks ago.  Endorses cough is harsh causing bilateral lower back pain and pain that intermittently radiates to the side of the neck, typically improves with use of ibuprofen.  Cough interfering with sleep at times. Experiencing intermittent wheezing.  Decreased appetite but tolerating some food and fluids.  No known sick contacts.  History of COPD, bronchitis, using ProAir and Spiriva, former smoker.  Denies fevers.  Additionally has attempted use of Tessalon, Tussionex, no improvement.  Has upcoming appointment with pulmonology on April 10.     Patient concerned with darkened urine, lower abdominal pain, urinary frequency and urgency present for 3 to 4 days.  Had 1 episode of hematuria which has resolved.  Has not attempted treatment.  Denies fevers.       Past Medical History:  Diagnosis Date   Arthritis    Asthma    Chronic asthmatic bronchitis    COPD (chronic obstructive pulmonary disease) (HCC)    Suspected   Depression    Dyspnea    on exertion   Environmental allergies    GERD (gastroesophageal reflux disease)    Glaucoma    Headache    migraine without aura, not intractable   Hyperlipidemia    Osteopenia    of lumbar spine   Pneumonia    3 times    Patient Active Problem List   Diagnosis Date Noted   Chronic rhinitis 07/28/2021   Multiple closed stable fractures of pubic ramus ([place]) 06/26/2021   Multiple  closed pelvic fractures without disruption of pelvic circle (HCC)    Nausea    COVID-19 virus infection    Hypoxia    Neuropathy    Pubic ramus fracture ([place]) 06/25/2021   GERD (gastroesophageal reflux disease) 06/25/2021   COPD (chronic obstructive pulmonary disease) ([place]) 01/02/2019   Pneumonia 05/05/2015   Hospital acquired PNA 11/26/2014   DOE (dyspnea on exertion) 02/24/2014   Cough 02/24/2014    Past Surgical History:  Procedure Laterality Date   AMPUTATION TOE     APPENDECTOMY     COLONOSCOPY WITH PROPOFOL N/A 08/19/2020   Procedure: COLONOSCOPY WITH PROPOFOL;  Surgeon: Lesly Rubenstein, MD;  Location: [facility] ENDOSCOPY;  Service: Endoscopy;  Laterality: N/A;  [facility] COVID POSITIVE TEST - ATTACHED TO CHART   ESOPHAGOGASTRODUODENOSCOPY N/A 10/11/2021   Procedure: ESOPHAGOGASTRODUODENOSCOPY (EGD);  Surgeon: Robert Bellow, MD;  Location: [facility] ENDOSCOPY;  Service: Endoscopy;  Laterality: N/A;   ESOPHAGOGASTRODUODENOSCOPY (EGD) WITH PROPOFOL N/A 08/19/2020   Procedure: ESOPHAGOGASTRODUODENOSCOPY (EGD) WITH PROPOFOL;  Surgeon: Lesly Rubenstein, MD;  Location: [facility] ENDOSCOPY;  Service: Endoscopy;  Laterality: N/A;   FEMORAL HERNIA REPAIR Right 01/15/2017   Procedure: HERNIA REPAIR FEMORAL;  Surgeon: Leonie Green, MD;  Location: [facility] ORS;  Service: General;  Laterality: Right;   HERNIA REPAIR Right    ROTATOR CUFF REPAIR Right    TONSILLECTOMY     VAGINAL  HYSTERECTOMY     1960's    OB History     Gravida  3   Para      Term      Preterm      AB      Living  2      SAB      IAB      Ectopic      Multiple      Live Births               Home Medications    Prior to Admission medications   Medication Sig Start Date End Date Taking? Authorizing Provider  formoterol (PERFOROMIST) 20 MCG/2ML nebulizer solution Inhale into the lungs. 12/22/21 12/22/22 Yes [provider]  SEREVENT DISKUS 50 MCG/ACT diskus inhaler SMARTSIG:1 Puff(s) By  Mouth Every 12 Hours 03/20/22  Yes [provider]  acetaminophen (TYLENOL) 500 MG tablet Take 1,000 mg by mouth 2 (two) times daily as needed for mild pain or headache.    [provider]  Albuterol Sulfate (PROAIR RESPICLICK) 123XX123 (90 Base) MCG/ACT AEPB Inhale 2 puffs into the lungs every 4 (four) hours as needed. Patient taking differently: Inhale 2 puffs into the lungs every 4 (four) hours as needed (shortness of breath or wheezing). 09/27/20   Martyn Ehrich, NP  aspirin EC 81 MG tablet Take 81 mg by mouth daily.    [provider]  benzonatate (TESSALON) 100 MG capsule Take 1 capsule (100 mg total) by mouth 3 (three) times daily as needed for cough. 08/03/22   [place], Leitha Schuller, NP  calcium-vitamin D (OSCAL WITH D) 500-5 MG-MCG tablet Take 1 tablet by mouth 2 (two) times daily. 06/27/21   Loletha Grayer, MD  cetirizine (ZYRTEC) 5 MG tablet Take 5 mg by mouth daily.    [provider]  chlorpheniramine-HYDROcodone (TUSSIONEX) 10-8 MG/5ML Take 5 mLs by mouth every 12 (twelve) hours as needed for cough. 08/03/22   Hans Eden, NP  cholecalciferol (VITAMIN D3) 25 MCG (1000 UNIT) tablet Take 1,000 Units by mouth daily.    [provider]  gabapentin (NEURONTIN) 300 MG capsule Take 300 mg by mouth 2 (two) times daily.    [provider]  ipratropium (ATROVENT) 0.03 % nasal spray Place 2 sprays into both nostrils 2 (two) times daily.    [provider]  ipratropium-albuterol (DUONEB) 0.5-2.5 (3) MG/3ML SOLN Take 3 mLs by nebulization 4 (four) times daily as needed for shortness of breath or wheezing.    [provider]  levocetirizine (XYZAL) 5 MG tablet Take by mouth. 04/17/22   [provider]  montelukast (SINGULAIR) 10 MG tablet Take 1 tablet (10 mg total) by mouth at bedtime. 09/27/20   Martyn Ehrich, NP  ondansetron (ZOFRAN) 4 MG tablet Take 1 tablet (4 mg total) by mouth every 6 (six) hours as needed for  nausea. 06/27/21   Loletha Grayer, MD  predniSONE (DELTASONE) 20 MG tablet Take 2 tablets (40 mg total) by mouth daily. 08/03/22   Hans Eden, NP  sertraline (ZOLOFT) 50 MG tablet Take 50 mg by mouth daily. 07/23/19   [provider]  Tiotropium Bromide Monohydrate (SPIRIVA RESPIMAT) 1.25 MCG/ACT AERS INHALE 2 SPRAY(S) BY MOUTH ONCE DAILY 11/24/21   Martyn Ehrich, NP  vitamin B-12 (CYANOCOBALAMIN) 1000 MCG tablet Take 1,000 mcg by mouth daily.    [provider]    Family History Family History  Problem Relation Age of Onset  COPD Father    Asthma Daughter    Asthma Grandchild    Cancer Daughter        breast   Breast cancer Daughter    Other Mother        MVA    Social History Social History   Tobacco Use   Smoking status: Former    Packs/day: 0.50    Years: 30.00    Total pack years: 15.00    Types: Cigarettes    Quit date: 02/24/2009    Years since quitting: 13.5   Smokeless tobacco: Never  Vaping Use   Vaping Use: Never used  Substance Use Topics   Alcohol use: No   Drug use: No     Allergies   Doxycycline, Fluticasone-salmeterol, Amoxicillin-pot clavulanate, Diazepam, and Escitalopram oxalate   Review of Systems Review of Systems Defer to HPI    Physical Exam Triage Vital Signs ED Triage Vitals  Enc Vitals Group     BP 08/27/22 1253 124/79     Pulse Rate 08/27/22 1253 97     Resp 08/27/22 1253 18     Temp 08/27/22 1253 98.6 F (37 C)     Temp Source 08/27/22 1253 Oral     SpO2 08/27/22 1253 93 %     Weight --      Height --      Head Circumference --      Peak Flow --      Pain Score 08/27/22 1251 8     Pain Loc --      Pain Edu? --      Excl. in [place]? --    No data found.  Updated Vital Signs BP 124/79 (BP Location: Left Arm)   Pulse 97   Temp 98.6 F (37 C) (Oral)   Resp 18   SpO2 93%   Visual Acuity Right Eye Distance:   Left Eye Distance:   Bilateral Distance:    Right Eye Near:   Left Eye Near:     Bilateral Near:     Physical Exam Constitutional:      Appearance: Normal appearance.  HENT:     Head: Normocephalic.  Eyes:     Extraocular Movements: Extraocular movements intact.  Cardiovascular:     Rate and Rhythm: Normal rate and regular rhythm.     Pulses: Normal pulses.     Heart sounds: Normal heart sounds.  Pulmonary:     Effort: Pulmonary effort is normal.     Breath sounds: Normal breath sounds.  Abdominal:     General: Abdomen is flat. Bowel sounds are normal.     Palpations: Abdomen is soft.     Tenderness: There is abdominal tenderness in the suprapubic area.  Neurological:     Mental Status: She is alert and oriented to person, place, and time. Mental status is at baseline.      UC Treatments / Results  Labs (all labs ordered are listed, but only abnormal results are displayed) Labs Reviewed  URINALYSIS, W/ REFLEX TO CULTURE (INFECTION SUSPECTED) - Abnormal; Notable for the following components:      Result Value   Hgb urine dipstick MODERATE (*)    Protein, ur TRACE (*)    Nitrite POSITIVE (*)    Leukocytes,Ua LARGE (*)    Bacteria, UA MANY (*)    All other components within normal limits  URINE CULTURE    EKG   Radiology No results found.  Procedures Procedures (including critical care time)  Medications Ordered in UC Medications - No data to display  Initial Impression / Assessment and Plan / UC Course  I have reviewed the triage vital signs and the nursing notes.  Pertinent labs & imaging results that were available during my care of the patient were reviewed by me and considered in my medical decision making (see chart for details).  COPD, acute cystitis without hematuria  Cough congestion and shortness of breath are most likely results of progressing COPD, last chest x-ray when having same symptoms completed on 08/03/2022, negative, will defer today, has attempted use of antibiotics, steroids and cough medication with no avail,  endorses that she has Tessalon medication at home, will attempt use of an additional steroid course, prednisone 60 mg taper prescribed and strongly advised patient to keep upcoming appointment with pulmonology for further management  Urinalysis showing leukocytes and nitrates, sent for culture, Keflex prescribed for treatment and recommended supportive care through increase fluids, Azo as needed and good hygiene, may follow-up with his urgent care as needed symptoms persist or worsen Final Clinical Impressions(s) / UC Diagnoses   Final diagnoses:  None     Discharge Instructions      For the symptoms of cough congestion and shortness of breath this is most likely related to her COPD as no improvement has been seen through use of antibiotics and steroids, she will need to be evaluated by pulmonology who is the lung specialist, information is listed on front page, please schedule a follow-up appointment  Your urinalysis shows Suzetta Timko blood cells and nitrates which are indicative of infection, your urine will be sent to the lab to determine exactly which bacteria is present, if any changes need to be made to your medications you will be notified  Begin use of keflex every morning and every evening for 5 days   You may use over-the-counter Azo to help minimize your symptoms until antibiotic removes bacteria, this medication will turn your urine orange  Increase your fluid intake through use of water  As always practice good hygiene, wiping front to back and avoidance of scented vaginal products to prevent further irritation  If symptoms continue to persist after use of medication or recur please follow-up with urgent care or your primary doctor as needed    ED Prescriptions   None    PDMP not reviewed this encounter.   Hans Eden, NP 08/27/22 1352

## 2022-08-27 NOTE — Discharge Instructions (Addendum)
For your cough and shortness of breath, begin prednisone every morning as directed to reduce and calm the airway  You may continue use of benzonatate tablets as needed for additional comfort for coughing  Please keep upcoming appointment with pulmonologist for further evaluation and management of your COPD  Your urinalysis shows Sandy Eaton blood cells and nitrates which are indicative of infection, your urine will be sent to the lab to determine exactly which bacteria is present, if any changes need to be made to your medications you will be notified  Begin use of keflex every morning and every evening for 5 days   You may use over-the-counter Azo to help minimize your symptoms until antibiotic removes bacteria, this medication will turn your urine orange  Increase your fluid intake through use of water  As always practice good hygiene, wiping front to back and avoidance of scented vaginal products to prevent further irritation  If symptoms continue to persist after use of medication or recur please follow-up with urgent care or your primary doctor as needed

## 2022-09-03 LAB — URINE CULTURE: Culture: >=100000 — AB

## 2022-09-18 ENCOUNTER — Other Ambulatory Visit: Payer: Self-pay | Admitting: Family Medicine

## 2022-09-18 DIAGNOSIS — Z1231 Encounter for screening mammogram for malignant neoplasm of breast: Secondary | ICD-10-CM

## 2022-09-23 IMAGING — CT CT ANGIO CHEST
3 of 6 series · 17 of 46 positions shown · IV contrast (APPLIED)
Comparison: 02/26/2020

CLINICAL DATA: 81-year-old female with thoracic aneurysm

EXAM:
CT ANGIOGRAPHY CHEST WITH CONTRAST
TECHNIQUE: Multidetector CT imaging of the chest was performed using the
standard protocol during bolus administration of intravenous
contrast. Multiplanar CT image reconstructions and MIPs were
obtained to evaluate the vascular anatomy.

[Series 4: axial arterial · axial · arterial · 0.72mm/px · z∈[-152,+85]mm · 11 of 95 slices shown]
[im 8/95  lung]
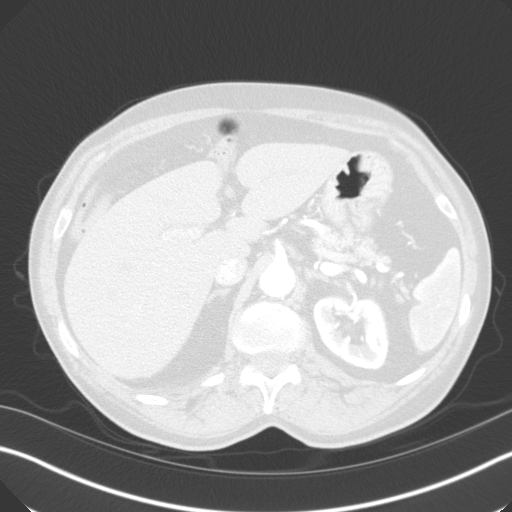
[im 16/95  soft-tissue]
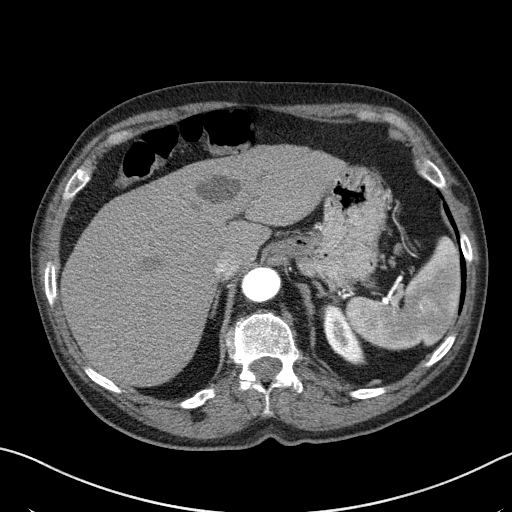
[im 24/95  lung]
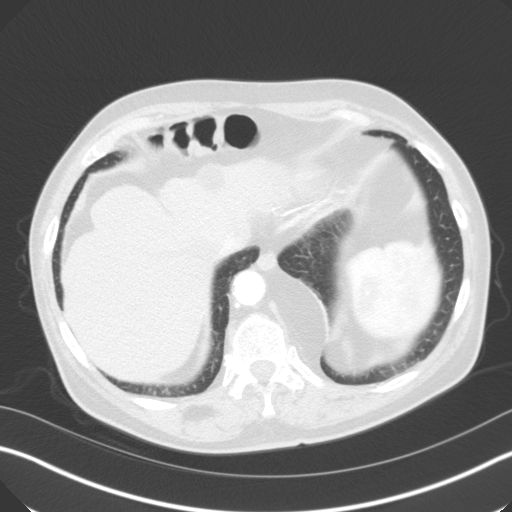
[im 32/95  soft-tissue]
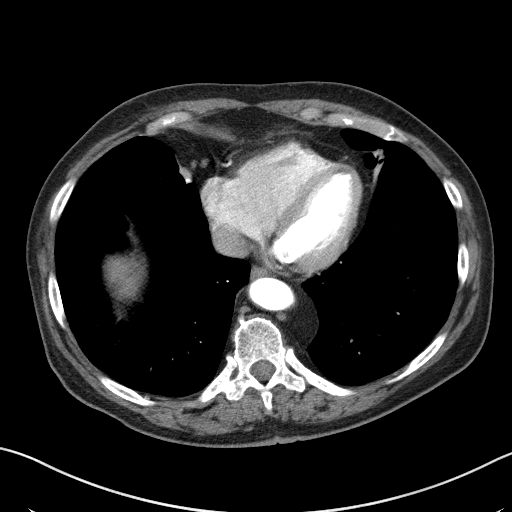
[im 40/95  lung]
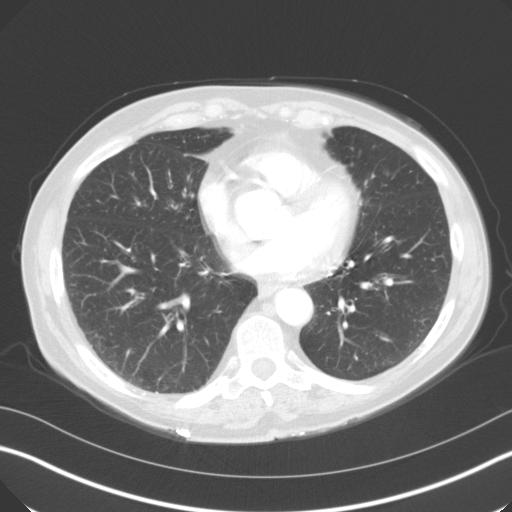
[im 48/95  soft-tissue]
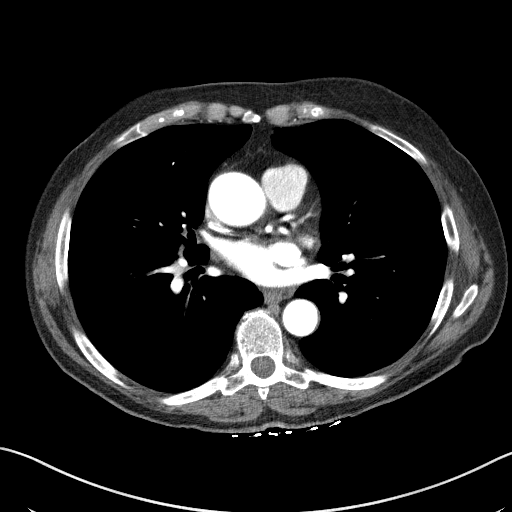
[im 55/95  lung]
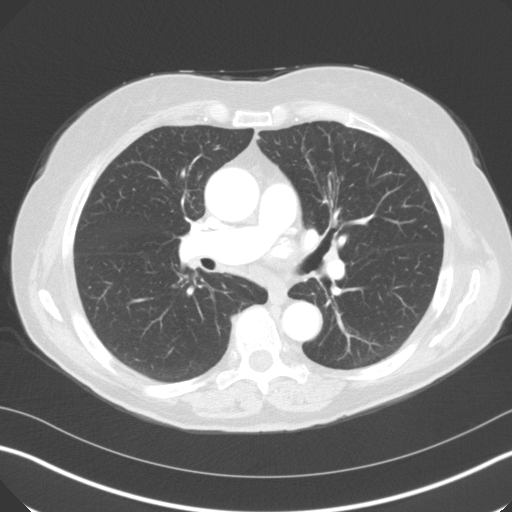
[im 63/95  soft-tissue]
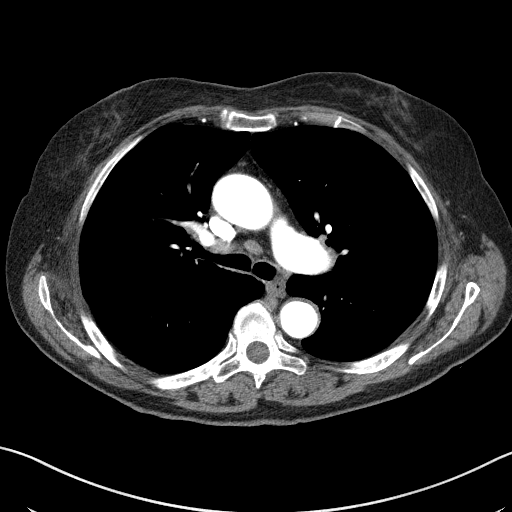
[im 71/95  lung]
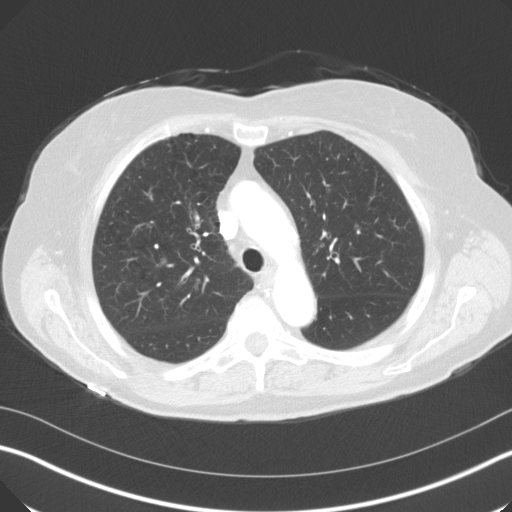
[im 79/95  soft-tissue]
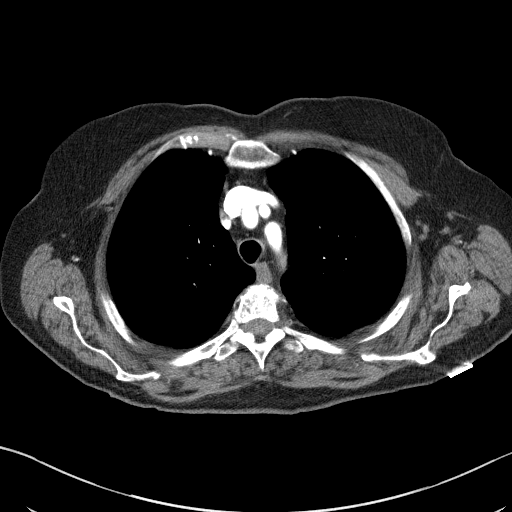
[im 87/95  lung]
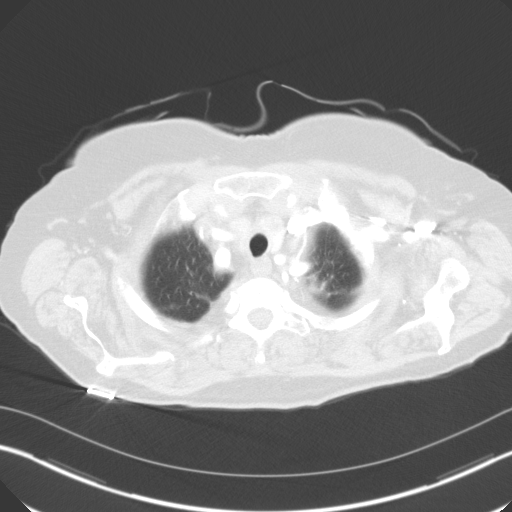

[Series 5: lung · axial · 0.72mm/px · z∈[-143,-79]mm · 3 of 142 slices shown]
[im 16/142  soft-tissue]
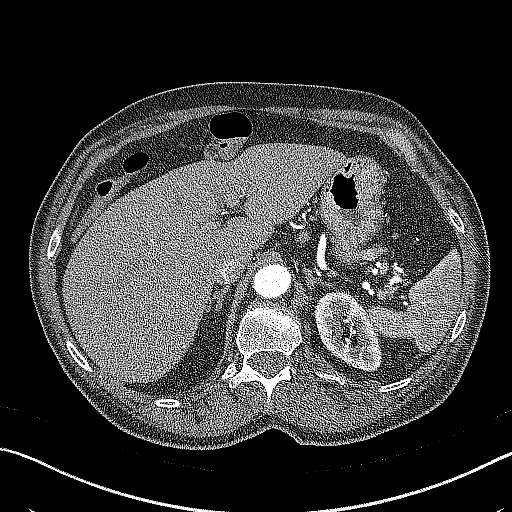
[im 32/142  soft-tissue]
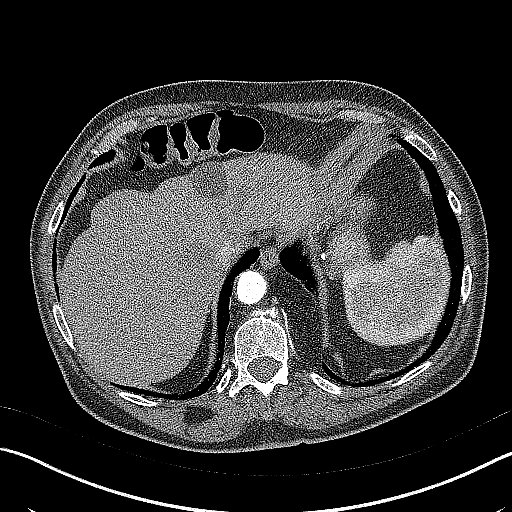
[im 48/142  soft-tissue]
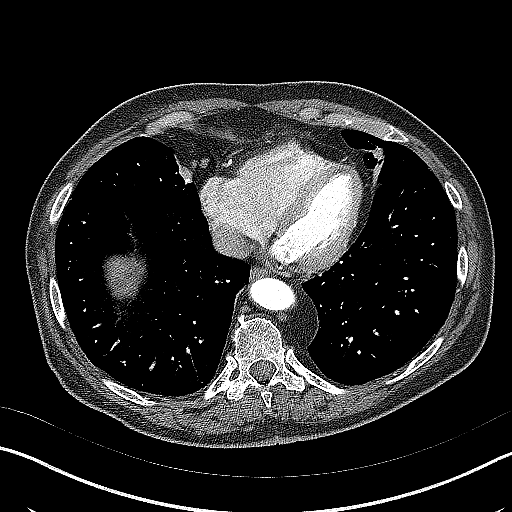

[Series 7: coronal · coronal · 0.56mm/px · 3 of 89 slices shown]
[im 23/89  soft-tissue]
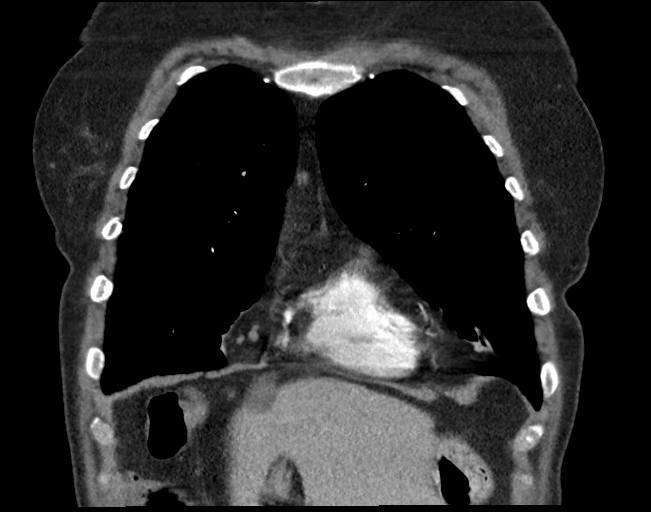
[im 45/89  soft-tissue]
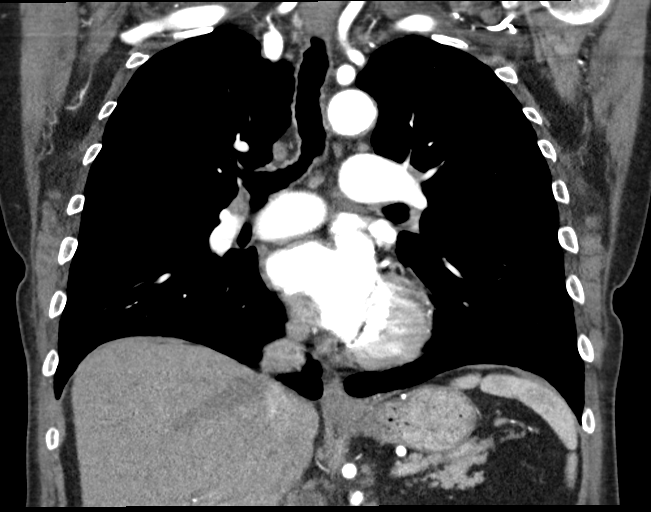
[im 67/89  soft-tissue]
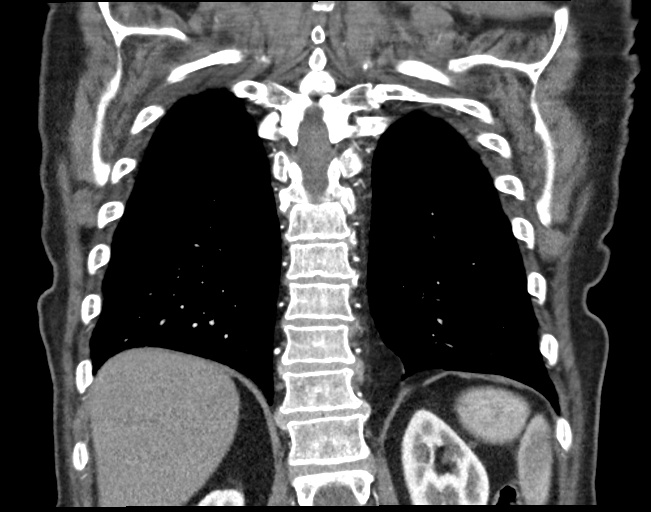

[17 of 46 positions shown; findings below may reference images not displayed]

RADIATION DOSE REDUCTION: This exam was performed according to the
departmental dose-optimization program which includes automated
exposure control, adjustment of the mA and/or kV according to
patient size and/or use of iterative reconstruction technique.

CONTRAST:  75mL OMNIPAQUE IOHEXOL 350 MG/ML SOLN
FINDINGS: Cardiovascular:

Heart:

No cardiomegaly. No pericardial fluid/thickening. No significant
coronary calcifications. Mitral annular calcifications.

Aorta:

No significant aortic valve calcifications.

Greatest estimated diameter of the aortic annulus 24 mm on the
coronal reformatted images.

Greatest estimated diameter of the [place] junction, 27 mm on
the coronal images.

Greatest estimated diameter of the ascending aorta on the axial
images, 38 mm.

Mild atherosclerotic changes of the aorta. Branch vessels are patent
with a 3 vessel arch. Cervical cerebral vessels patent at the base
of the neck.

No pedunculated plaque, ulcerated plaque, dissection, periaortic
fluid. No wall thickening.

Pulmonary arteries:

Timing of the contrast bolus is not optimized for evaluation of
pulmonary artery filling defects. Unremarkable size of the main
pulmonary artery.

Mediastinum/Nodes: No mediastinal adenopathy. Unremarkable
appearance of the thoracic esophagus.

Unremarkable appearance of the thoracic inlet.

Lungs/Pleura: Central airways are clear. No pleural effusion. No
confluent airspace disease.

No pneumothorax.

Mild subpleural reticulation in the right lower lobe. Paraseptal
emphysematous changes in the upper lobes medially, greater on the
right. Scarring at the apices.

Upper Abdomen: Lymph nodes in the gastrohepatic ligament which are
slightly more prominent than the abdominal CT 11/01/2016.
Questionable thickening at the GE junction.

Unchanged fluid density cystic structure within the liver.

Musculoskeletal: No acute displaced fracture. Degenerative changes
of the spine.

Review of the MIP images confirms the above findings.
IMPRESSION: CT angiogram negative for thoracic aortic aneurysm with the greatest
estimated diameter of the ascending aorta 3.8 cm.

Persisting lymph nodes in the upper abdomen within the gastrohepatic
ligament, with associated questionable thickening of the GE
junction. This may reflect chronic inflammatory changes, and
correlation with any history of GERD/reflux may be useful. However,
malignancy at the GE junction cannot be excluded, and referral for
GI evaluation and consideration of upper endoscopy and/or upper
GI/swallow study may be useful.

Aortic Atherosclerosis (9Q21Z-CQW.W) and Emphysema (9Q21Z-E59.A).

## 2022-09-25 ENCOUNTER — Ambulatory Visit (INDEPENDENT_AMBULATORY_CARE_PROVIDER_SITE_OTHER): Payer: 59 | Admitting: Student in an Organized Health Care Education/Training Program

## 2022-09-25 ENCOUNTER — Encounter: Payer: Self-pay | Admitting: Student in an Organized Health Care Education/Training Program

## 2022-09-25 VITALS — BP 106/60 | HR 84 | Temp 97.7°F | Ht 64.0 in | Wt 149.8 lb

## 2022-09-25 DIAGNOSIS — J4489 Other specified chronic obstructive pulmonary disease: Secondary | ICD-10-CM

## 2022-09-25 MED ORDER — BUDESONIDE-FORMOTEROL FUMARATE 80-4.5 MCG/ACT IN AERO: 2.0000 | INHALATION_SPRAY | Freq: Two times a day (BID) | RESPIRATORY_TRACT | 12 refills | Status: DC

## 2022-09-25 NOTE — Patient Instructions (Signed)
Today, I ordered blood work. You can get them draw at your preferred [facility] draw station. The nearest one to [facility] is at nearby [facility] ([address], [place], [place] [address]). 

## 2022-09-25 NOTE — Progress Notes (Signed)
Synopsis: Re-establishing care for shortness of breath and wheezing.  Assessment & Plan:   1. Moderate Persistent Asthma  Presenting to re-establish care. Patient is a former smoker, thou her PFT performed in 2017 showed no signs of obstructive lung disease. Given previously normal PFT's, and episodic nature of her symptoms, She likely has moderate persistent asthma with frequent exacerbations. She is not maintained on any controller medicine, and I will switch her back to Symbicort two puffs twice daily as well as needed. Furthermore, I will obtain an allergen panel and cbc w/ differential to assess t-helper cell type response. Patient will hold on using her other inhalers/nebulizers and call our office if her symptoms worsen and/or fail to improve.  - budesonide-formoterol (SYMBICORT) 80-4.5 MCG/ACT inhaler; Inhale 2 puffs into the lungs in the morning and at bedtime. Use twice daily, and every 4 hours as needed for wheezing.  Dispense: 1 each; Refill: 12 - Allergen Panel (27) + IGE; Future - CBC w/Diff; Future   Return in about 2 months (around 11/25/2022).  I spent 30 minutes caring for this patient today, including preparing to see the patient, obtaining a medical history , reviewing a separately obtained history, performing a medically appropriate examination and/or evaluation, counseling and educating the patient/family/caregiver, ordering medications, tests, or procedures, documenting clinical information in the electronic health record, and independently interpreting results (not separately reported/billed) and communicating results to the patient/family/caregiver  Raechel Chute, MD [facility] 09/25/2022 4:27 PM    End of visit medications:  Meds ordered this encounter  Medications   budesonide-formoterol (SYMBICORT) 80-4.5 MCG/ACT inhaler    Sig: Inhale 2 puffs into the lungs in the morning and at bedtime. Use twice daily, and every 4 hours as needed for  wheezing.    Dispense:  1 each    Refill:  12     Current Outpatient Medications:    acetaminophen (TYLENOL) 500 MG tablet, Take 1,000 mg by mouth 2 (two) times daily as needed for mild pain or headache., Disp: , Rfl:    Albuterol Sulfate (PROAIR RESPICLICK) 108 (90 Base) MCG/ACT AEPB, Inhale 2 puffs into the lungs every 4 (four) hours as needed. (Patient taking differently: Inhale 2 puffs into the lungs every 4 (four) hours as needed (shortness of breath or wheezing).), Disp: 1 each, Rfl: 3   aspirin EC 81 MG tablet, Take 81 mg by mouth daily., Disp: , Rfl:    benzonatate (TESSALON) 100 MG capsule, Take 1 capsule (100 mg total) by mouth 3 (three) times daily as needed for cough., Disp: 30 capsule, Rfl: 1   budesonide-formoterol (SYMBICORT) 80-4.5 MCG/ACT inhaler, Inhale 2 puffs into the lungs in the morning and at bedtime. Use twice daily, and every 4 hours as needed for wheezing., Disp: 1 each, Rfl: 12   calcium-vitamin D (OSCAL WITH D) 500-5 MG-MCG tablet, Take 1 tablet by mouth 2 (two) times daily., Disp: 60 tablet, Rfl: 0   cetirizine (ZYRTEC) 5 MG tablet, Take 5 mg by mouth daily., Disp: , Rfl:    chlorpheniramine-HYDROcodone (TUSSIONEX) 10-8 MG/5ML, Take 5 mLs by mouth every 12 (twelve) hours as needed for cough., Disp: 115 mL, Rfl: 0   cholecalciferol (VITAMIN D3) 25 MCG (1000 UNIT) tablet, Take 1,000 Units by mouth daily., Disp: , Rfl:    formoterol (PERFOROMIST) 20 MCG/2ML nebulizer solution, Inhale into the lungs., Disp: , Rfl:    gabapentin (NEURONTIN) 300 MG capsule, Take 300 mg by mouth 2 (two) times daily., Disp: , Rfl:  ipratropium (ATROVENT) 0.03 % nasal spray, Place 2 sprays into both nostrils 2 (two) times daily., Disp: , Rfl:    ipratropium-albuterol (DUONEB) 0.5-2.5 (3) MG/3ML SOLN, Take 3 mLs by nebulization 4 (four) times daily as needed for shortness of breath or wheezing., Disp: , Rfl:    levocetirizine (XYZAL) 5 MG tablet, Take by mouth., Disp: , Rfl:    montelukast  (SINGULAIR) 10 MG tablet, Take 1 tablet (10 mg total) by mouth at bedtime., Disp: 30 tablet, Rfl: 5   ondansetron (ZOFRAN) 4 MG tablet, Take 1 tablet (4 mg total) by mouth every 6 (six) hours as needed for nausea., Disp: 20 tablet, Rfl: 0   sertraline (ZOLOFT) 50 MG tablet, Take 50 mg by mouth daily., Disp: , Rfl:    Tiotropium Bromide Monohydrate (SPIRIVA RESPIMAT) 1.25 MCG/ACT AERS, INHALE 2 SPRAY(S) BY MOUTH ONCE DAILY, Disp: 4 g, Rfl: 5   vitamin B-12 (CYANOCOBALAMIN) 1000 MCG tablet, Take 1,000 mcg by mouth daily., Disp: , Rfl:    Subjective:   PATIENT ID: Sandy Eaton GENDER: female DOB: 1940-07-05, MRN: [id number]  Chief Complaint  Patient presents with   pulmonary consult    SOB with exertion and occ dry cough. Hx of COPD.     HPI  Patient is a pleasant 82 year old female presenting to clinic for the evaluation of cough and shortness of breath.  She was previously seen in our clinic and followed by Dr. Bard Herbert. Patient has also been seen by NP Clent Ridges and NP Parrett in our office. She was previously maintained on Symbicort for Asthma, and subsequently switched to Spiriva secondary to recurrent thrush.  Patient reports recurrent episodes of cough, wheezing, and shortness of breath. This usually resolved with prednisone as well as a course of antibiotics. She has her Spiriva, as well as a Dulera and albuterol (both of which she uses as needed). She has had no fevers, no chills, no chest pain, no chest tightness, and no sputum production. She is not currently wheezing. Her episodic wheezing and shortness of breath appears to be currently improved, but she is concerned that it might recur. Patient is also using Cetirizine and Montelukast.  PFT 2017: FEV1/FVC 74%, FEV1 109% predicted, TLC 99% predicted, DLCO 70% predicted  Patient is a former smoker, having quit in 2010. She has around 15 pack years of smoking history. Patient has cats at home.  Ancillary information including prior  medications, full medical/surgical/family/social histories, and PFTs (when available) are listed below and have been reviewed.   Review of Systems  Constitutional:  Negative for chills, fever and weight loss.  Respiratory:  Positive for cough, shortness of breath and wheezing. Negative for hemoptysis and sputum production.   Cardiovascular:  Negative for chest pain.  Skin:  Negative for rash.     Objective:   Vitals:   09/25/22 1602  BP: 106/60  Pulse: 84  Temp: 97.7 F (36.5 C)  TempSrc: Temporal  SpO2: 95%  Weight: 149 lb 12.8 oz (67.9 kg)  Height: 5\' 4"  (1.626 m)   95% on RA BMI Readings from Last 3 Encounters:  09/25/22 25.71 kg/m  08/03/22 25.33 kg/m  10/11/21 25.33 kg/m   Wt Readings from Last 3 Encounters:  09/25/22 149 lb 12.8 oz (67.9 kg)  08/03/22 143 lb (64.9 kg)  10/11/21 143 lb (64.9 kg)    Physical Exam Constitutional:      General: She is not in acute distress.    Appearance: She is not ill-appearing.  HENT:  Nose: Nose normal.     Mouth/Throat:     Mouth: Mucous membranes are moist.  Eyes:     Pupils: Pupils are equal, round, and reactive to light.  Cardiovascular:     Rate and Rhythm: Normal rate and regular rhythm.     Pulses: Normal pulses.     Heart sounds: Normal heart sounds.  Pulmonary:     Effort: Pulmonary effort is normal.     Breath sounds: Normal breath sounds. No wheezing or rales.  Abdominal:     Palpations: Abdomen is soft.  Neurological:     General: No focal deficit present.     Mental Status: She is alert and oriented to person, place, and time. Mental status is at baseline.       Ancillary Information    Past Medical History:  Diagnosis Date   Arthritis    Asthma    Chronic asthmatic bronchitis    COPD (chronic obstructive pulmonary disease)    Suspected   Depression    Dyspnea    on exertion   Environmental allergies    GERD (gastroesophageal reflux disease)    Glaucoma    Headache    migraine  without aura, not intractable   Hyperlipidemia    Osteopenia    of lumbar spine   Pneumonia    3 times     Family History  Problem Relation Age of Onset   COPD Father    Asthma Daughter    Asthma Grandchild    Cancer Daughter        breast   Breast cancer Daughter    Other Mother        MVA     Past Surgical History:  Procedure Laterality Date   AMPUTATION TOE     APPENDECTOMY     COLONOSCOPY WITH PROPOFOL N/A 08/19/2020   Procedure: COLONOSCOPY WITH PROPOFOL;  Surgeon: Regis Bill, MD;  Location: [facility] ENDOSCOPY;  Service: Endoscopy;  Laterality: N/A;  [facility] COVID POSITIVE TEST - ATTACHED TO CHART   ESOPHAGOGASTRODUODENOSCOPY N/A 10/11/2021   Procedure: ESOPHAGOGASTRODUODENOSCOPY (EGD);  Surgeon: Earline Mayotte, MD;  Location: [facility] ENDOSCOPY;  Service: Endoscopy;  Laterality: N/A;   ESOPHAGOGASTRODUODENOSCOPY (EGD) WITH PROPOFOL N/A 08/19/2020   Procedure: ESOPHAGOGASTRODUODENOSCOPY (EGD) WITH PROPOFOL;  Surgeon: Regis Bill, MD;  Location: [facility] ENDOSCOPY;  Service: Endoscopy;  Laterality: N/A;   FEMORAL HERNIA REPAIR Right 01/15/2017   Procedure: HERNIA REPAIR FEMORAL;  Surgeon: Nadeen Landau, MD;  Location: [facility] ORS;  Service: General;  Laterality: Right;   HERNIA REPAIR Right    ROTATOR CUFF REPAIR Right    TONSILLECTOMY     VAGINAL HYSTERECTOMY     1960's    Social History   Socioeconomic History   Marital status: Single    Spouse name: Not on file   Number of children: Not on file   Years of education: Not on file   Highest education level: Not on file  Occupational History   Not on file  Tobacco Use   Smoking status: Former    Packs/day: 0.50    Years: 30.00    Additional pack years: 0.00    Total pack years: 15.00    Types: Cigarettes    Quit date: 02/24/2009    Years since quitting: 13.5   Smokeless tobacco: Never  Vaping Use   Vaping Use: Never used  Substance and Sexual Activity   Alcohol use: No   Drug use: No   Sexual  activity: Not  on file  Other Topics Concern   Not on file  Social History Narrative   Not on file   Social Determinants of Health   Financial Resource Strain: Not on file  Food Insecurity: Not on file  Transportation Needs: Not on file  Physical Activity: Not on file  Stress: Not on file  Social Connections: Not on file  Intimate Partner Violence: Not on file     Allergies  Allergen Reactions   Doxycycline Nausea Only   Fluticasone-Salmeterol Other (See Comments)    Thrush   Amoxicillin-Pot Clavulanate Nausea And Vomiting and Nausea Only    Has patient had a PCN reaction causing immediate rash, facial/tongue/throat swelling, SOB or lightheadedness with hypotension: No  Has patient had a PCN reaction causing severe rash involving mucus membranes or skin necrosis: No  Has patient had a PCN reaction that required hospitalization: No  Has patient had a PCN reaction occurring within the last 10 years: No  If all of the above answers are "NO", then may proceed with Cephalosporin use.   Diazepam Other (See Comments)    Reaction:  Hallucinations   Escitalopram Oxalate Diarrhea     CBC    Component Value Date/Time   WBC 8.3 06/25/2021 1221   RBC 4.16 06/25/2021 1221   HGB 13.2 06/25/2021 1221   HGB 13.6 01/31/2014 1120   HCT 39.1 06/25/2021 1221   HCT 41.4 01/31/2014 1120   PLT 209 06/25/2021 1221   PLT 178 01/31/2014 1120   MCV 94.0 06/25/2021 1221   MCV 97 01/31/2014 1120   MCH 31.7 06/25/2021 1221   MCHC 33.8 06/25/2021 1221   RDW 11.9 06/25/2021 1221   RDW 12.3 01/31/2014 1120   LYMPHSABS 2.7 02/23/2020 1733   MONOABS 0.8 02/23/2020 1733   EOSABS 0.3 02/23/2020 1733   BASOSABS 0.1 02/23/2020 1733    Pulmonary Functions Testing Results:    Latest Ref Rng & Units 06/22/2015    2:09 PM  PFT Results  FVC-Pre L 3.09  P  FVC-Predicted Pre % 110  P  FVC-Post L 3.20  P  FVC-Predicted Post % 113  P  Pre FEV1/FVC % % 74  P  Post FEV1/FCV % % 72  P  FEV1-Pre L  2.30  P  FEV1-Predicted Pre % 109  P  FEV1-Post L 2.29  P  DLCO uncorrected ml/min/mmHg 17.08  P  DLCO UNC% % 70  P  DLVA Predicted % 69  P    P Preliminary result    Outpatient Medications Prior to Visit  Medication Sig Dispense Refill   acetaminophen (TYLENOL) 500 MG tablet Take 1,000 mg by mouth 2 (two) times daily as needed for mild pain or headache.     Albuterol Sulfate (PROAIR RESPICLICK) 108 (90 Base) MCG/ACT AEPB Inhale 2 puffs into the lungs every 4 (four) hours as needed. (Patient taking differently: Inhale 2 puffs into the lungs every 4 (four) hours as needed (shortness of breath or wheezing).) 1 each 3   aspirin EC 81 MG tablet Take 81 mg by mouth daily.     benzonatate (TESSALON) 100 MG capsule Take 1 capsule (100 mg total) by mouth 3 (three) times daily as needed for cough. 30 capsule 1   calcium-vitamin D (OSCAL WITH D) 500-5 MG-MCG tablet Take 1 tablet by mouth 2 (two) times daily. 60 tablet 0   cetirizine (ZYRTEC) 5 MG tablet Take 5 mg by mouth daily.     chlorpheniramine-HYDROcodone (TUSSIONEX) 10-8 MG/5ML Take 5 mLs  by mouth every 12 (twelve) hours as needed for cough. 115 mL 0   cholecalciferol (VITAMIN D3) 25 MCG (1000 UNIT) tablet Take 1,000 Units by mouth daily.     formoterol (PERFOROMIST) 20 MCG/2ML nebulizer solution Inhale into the lungs.     gabapentin (NEURONTIN) 300 MG capsule Take 300 mg by mouth 2 (two) times daily.     ipratropium (ATROVENT) 0.03 % nasal spray Place 2 sprays into both nostrils 2 (two) times daily.     ipratropium-albuterol (DUONEB) 0.5-2.5 (3) MG/3ML SOLN Take 3 mLs by nebulization 4 (four) times daily as needed for shortness of breath or wheezing.     levocetirizine (XYZAL) 5 MG tablet Take by mouth.     montelukast (SINGULAIR) 10 MG tablet Take 1 tablet (10 mg total) by mouth at bedtime. 30 tablet 5   ondansetron (ZOFRAN) 4 MG tablet Take 1 tablet (4 mg total) by mouth every 6 (six) hours as needed for nausea. 20 tablet 0   sertraline  (ZOLOFT) 50 MG tablet Take 50 mg by mouth daily.     Tiotropium Bromide Monohydrate (SPIRIVA RESPIMAT) 1.25 MCG/ACT AERS INHALE 2 SPRAY(S) BY MOUTH ONCE DAILY 4 g 5   vitamin B-12 (CYANOCOBALAMIN) 1000 MCG tablet Take 1,000 mcg by mouth daily.     predniSONE (STERAPRED UNI-PAK 21 TAB) 10 MG (21) TBPK tablet Take by mouth daily. Take 6 tabs by mouth daily  for 2 days, then 5 tabs for 2 days, then 4 tabs for 2 days, then 3 tabs for 2 days, 2 tabs for 2 days, then 1 tab by mouth daily for 2 days 42 tablet 0   SEREVENT DISKUS 50 MCG/ACT diskus inhaler SMARTSIG:1 Puff(s) By Mouth Every 12 Hours     No facility-administered medications prior to visit.

## 2022-09-28 IMAGING — CR DG CHEST 2V
2 series · 2 of 2 positions shown · non-contrast
Comparison: 09/01/2021

CLINICAL DATA: Shortness of breath for several weeks, initial
encounter

EXAM:
CHEST - 2 VIEW

[chest pa]
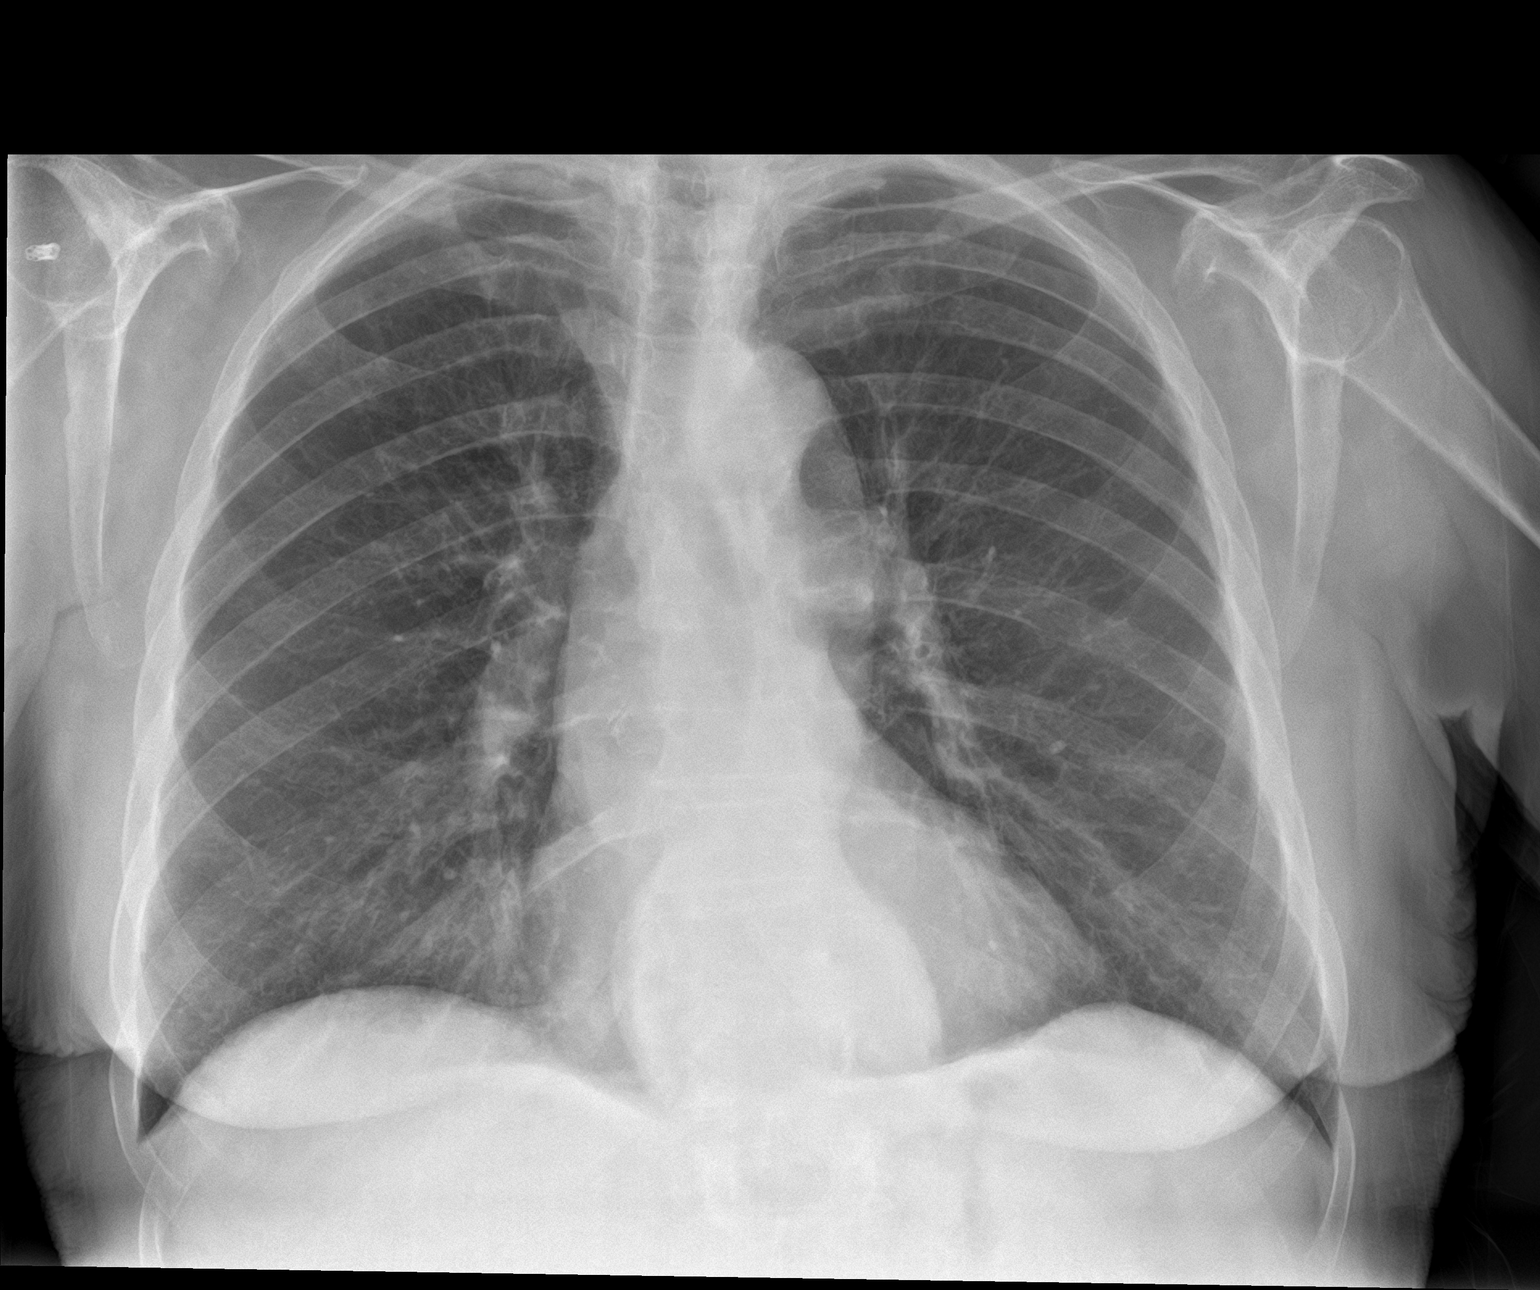

[chest lat]
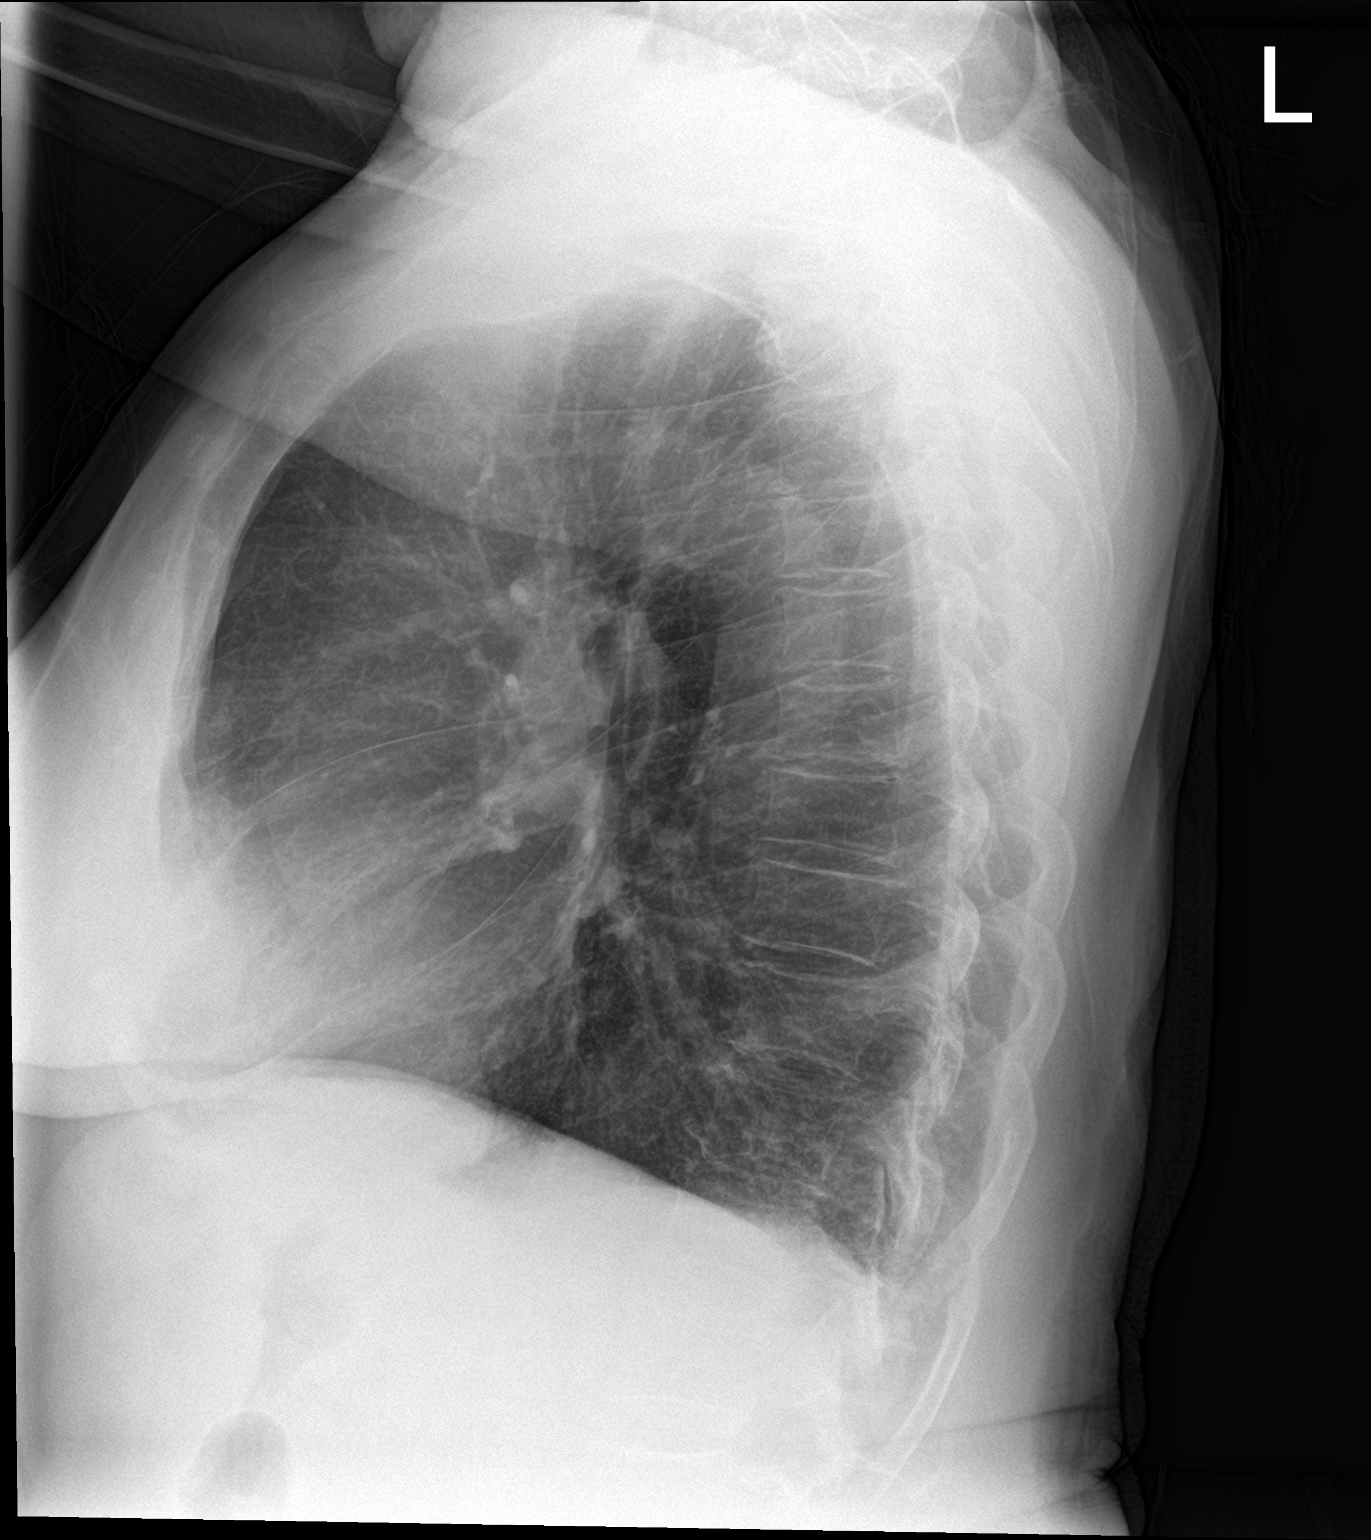

[2 of 2 positions shown; findings below may reference images not displayed]

FINDINGS: Cardiac shadow is within normal limits. Lungs are well aerated
bilaterally and clear. Prominent soft tissue density is noted
overlying the spine at the level of the diaphragm. This corresponds
to prominent paraspinal fat pad particularly on the left. No hiatal
hernia is present. No bony abnormality is seen.
IMPRESSION: No acute abnormality noted.

## 2022-10-03 ENCOUNTER — Ambulatory Visit
Admission: RE | Admit: 2022-10-03 | Discharge: 2022-10-03 | Disposition: A | Discharge status: Home / Self Care | Payer: 59 | Source: Ambulatory Visit | Attending: Family Medicine | Admitting: Family Medicine

## 2022-10-03 DIAGNOSIS — Z1231 Encounter for screening mammogram for malignant neoplasm of breast: Secondary | ICD-10-CM | POA: Insufficient documentation

## 2022-10-11 DIAGNOSIS — G2581 Restless legs syndrome: Secondary | ICD-10-CM | POA: Insufficient documentation

## 2022-10-11 DIAGNOSIS — F32A Depression, unspecified: Secondary | ICD-10-CM | POA: Insufficient documentation

## 2022-10-15 ENCOUNTER — Other Ambulatory Visit: Payer: Self-pay | Admitting: Family Medicine

## 2022-10-15 DIAGNOSIS — Z780 Asymptomatic menopausal state: Secondary | ICD-10-CM

## 2022-11-23 ENCOUNTER — Other Ambulatory Visit: Payer: 59

## 2022-11-28 ENCOUNTER — Ambulatory Visit: Payer: 59 | Admitting: Student in an Organized Health Care Education/Training Program

## 2022-12-14 ENCOUNTER — Ambulatory Visit (INDEPENDENT_AMBULATORY_CARE_PROVIDER_SITE_OTHER): Payer: 59 | Admitting: Student in an Organized Health Care Education/Training Program

## 2022-12-14 ENCOUNTER — Encounter: Payer: Self-pay | Admitting: Student in an Organized Health Care Education/Training Program

## 2022-12-14 VITALS — BP 100/60 | HR 72 | Temp 97.9°F | Ht 64.0 in | Wt 145.2 lb

## 2022-12-14 DIAGNOSIS — J4489 Other specified chronic obstructive pulmonary disease: Secondary | ICD-10-CM

## 2022-12-14 NOTE — Progress Notes (Signed)
Assessment & Plan:   #Moderate Persistent Asthma   Presenting for follow up of asthma. She is a former smoker and her PFT in 2017 showed no signs of obstructive lung disease. Given previously normal PFT's, and episodic nature of her symptoms, and improvement with ICS/LABA, the most likely diagnosis is asthma. She was restarted on controller medicine with ICS/LABA (Symbicort) and symptoms are much improved. Will hold off on getting t-helper type workup given improvement.  -continue Symbicort two puffs twice daily.  Return in about 7 months (around 07/16/2023).  I spent 30 minutes caring for this patient today, including preparing to see the patient, obtaining a medical history , reviewing a separately obtained history, performing a medically appropriate examination and/or evaluation, counseling and educating the patient/family/caregiver, and documenting clinical information in the electronic health record  Raechel Chute, MD  Pulmonary Critical Care 12/14/2022 11:48 AM    End of visit medications:  No orders of the defined types were placed in this encounter.    Current Outpatient Medications:    acetaminophen (TYLENOL) 500 MG tablet, Take 1,000 mg by mouth 2 (two) times daily as needed for mild pain or headache., Disp: , Rfl:    Albuterol Sulfate (PROAIR RESPICLICK) 108 (90 Base) MCG/ACT AEPB, Inhale 2 puffs into the lungs every 4 (four) hours as needed. (Patient taking differently: Inhale 2 puffs into the lungs every 4 (four) hours as needed (shortness of breath or wheezing).), Disp: 1 each, Rfl: 3   aspirin EC 81 MG tablet, Take 81 mg by mouth daily., Disp: , Rfl:    benzonatate (TESSALON) 100 MG capsule, Take 1 capsule (100 mg total) by mouth 3 (three) times daily as needed for cough., Disp: 30 capsule, Rfl: 1   budesonide-formoterol (SYMBICORT) 80-4.5 MCG/ACT inhaler, Inhale 2 puffs into the lungs in the morning and at bedtime. Use twice daily, and every 4 hours as needed for  wheezing., Disp: 1 each, Rfl: 12   calcium-vitamin D (OSCAL WITH D) 500-5 MG-MCG tablet, Take 1 tablet by mouth 2 (two) times daily., Disp: 60 tablet, Rfl: 0   cetirizine (ZYRTEC) 5 MG tablet, Take 5 mg by mouth daily., Disp: , Rfl:    chlorpheniramine-HYDROcodone (TUSSIONEX) 10-8 MG/5ML, Take 5 mLs by mouth every 12 (twelve) hours as needed for cough., Disp: 115 mL, Rfl: 0   cholecalciferol (VITAMIN D3) 25 MCG (1000 UNIT) tablet, Take 1,000 Units by mouth daily., Disp: , Rfl:    gabapentin (NEURONTIN) 300 MG capsule, Take 300 mg by mouth 2 (two) times daily., Disp: , Rfl:    ipratropium (ATROVENT) 0.03 % nasal spray, Place 2 sprays into both nostrils 2 (two) times daily., Disp: , Rfl:    ipratropium-albuterol (DUONEB) 0.5-2.5 (3) MG/3ML SOLN, Take 3 mLs by nebulization 4 (four) times daily as needed for shortness of breath or wheezing., Disp: , Rfl:    levocetirizine (XYZAL) 5 MG tablet, Take by mouth., Disp: , Rfl:    montelukast (SINGULAIR) 10 MG tablet, Take 1 tablet (10 mg total) by mouth at bedtime., Disp: 30 tablet, Rfl: 5   ondansetron (ZOFRAN) 4 MG tablet, Take 1 tablet (4 mg total) by mouth every 6 (six) hours as needed for nausea., Disp: 20 tablet, Rfl: 0   sertraline (ZOLOFT) 50 MG tablet, Take 50 mg by mouth daily., Disp: , Rfl:    vitamin B-12 (CYANOCOBALAMIN) 1000 MCG tablet, Take 1,000 mcg by mouth daily., Disp: , Rfl:    formoterol (PERFOROMIST) 20 MCG/2ML nebulizer solution, Inhale into the lungs. (  Patient not taking: Reported on 12/14/2022), Disp: , Rfl:    Subjective:   PATIENT ID: Sandy Eaton GENDER: female DOB: 1941/02/24, MRN: [id number]  Chief Complaint  Patient presents with   Follow-up    SOB and dry cough.     HPI  Patient is a pleasant 82 year old female presenting to clinic for follow up.   She was previously seen in our clinic and followed by Dr. Bard Herbert. Patient has also been seen by NP Clent Ridges and NP Parrett in our office. She was previously maintained  on Symbicort for Asthma, and subsequently switched to Spiriva secondary to recurrent thrush. I have restarted her Symbicort during her last visit and the patient reports feeling much better with it. Her cough is significantly improved, as are her symptoms of shortness of breath and wheezing. Her daughter with her today agrees.   Patient reports a history of recurrent episodes of cough, wheezing, and shortness of breath. This usually resolved with prednisone as well as a course of antibiotics. She has her Spiriva, as well as a Dulera and albuterol (both of which she uses as needed). Patient is also using Cetirizine and Montelukast.   PFT 2017: FEV1/FVC 74%, FEV1 109% predicted, TLC 99% predicted, DLCO 70% predicted   Patient is a former smoker, having quit in 2010. She has around 15 pack years of smoking history. Patient has cats at home.  Ancillary information including prior medications, full medical/surgical/family/social histories, and PFTs (when available) are listed below and have been reviewed.   Review of Systems  Constitutional:  Negative for chills, fever and weight loss.  Respiratory:  Negative for cough, hemoptysis, sputum production, shortness of breath and wheezing.   Cardiovascular:  Negative for chest pain.  Skin:  Negative for rash.     Objective:   Vitals:   12/14/22 1128  BP: 100/60  Pulse: 72  Temp: 97.9 F (36.6 C)  TempSrc: Temporal  SpO2: 95%  Weight: 145 lb 3.2 oz (65.9 kg)  Height: 5\' 4"  (1.626 m)   95% on RA  BMI Readings from Last 3 Encounters:  12/14/22 24.92 kg/m  09/25/22 25.71 kg/m  08/03/22 25.33 kg/m   Wt Readings from Last 3 Encounters:  12/14/22 145 lb 3.2 oz (65.9 kg)  09/25/22 149 lb 12.8 oz (67.9 kg)  08/03/22 143 lb (64.9 kg)    Physical Exam Constitutional:      General: She is not in acute distress.    Appearance: She is not ill-appearing.  HENT:     Nose: Nose normal.     Mouth/Throat:     Mouth: Mucous membranes are  moist.  Eyes:     Pupils: Pupils are equal, round, and reactive to light.  Cardiovascular:     Rate and Rhythm: Normal rate and regular rhythm.     Pulses: Normal pulses.     Heart sounds: Normal heart sounds.  Pulmonary:     Effort: Pulmonary effort is normal.     Breath sounds: Normal breath sounds. No wheezing or rales.  Abdominal:     Palpations: Abdomen is soft.  Neurological:     General: No focal deficit present.     Mental Status: She is alert and oriented to person, place, and time. Mental status is at baseline.     Ancillary Information    Past Medical History:  Diagnosis Date   Arthritis    Asthma    Chronic asthmatic bronchitis    COPD (chronic obstructive pulmonary disease) (HCC)  Suspected   Depression    Dyspnea    on exertion   Environmental allergies    GERD (gastroesophageal reflux disease)    Glaucoma    Headache    migraine without aura, not intractable   Hyperlipidemia    Osteopenia    of lumbar spine   Pneumonia    3 times     Family History  Problem Relation Age of Onset   COPD Father    Asthma Daughter    Asthma Grandchild    Cancer Daughter        breast   Breast cancer Daughter    Other Mother        MVA     Past Surgical History:  Procedure Laterality Date   AMPUTATION TOE     APPENDECTOMY     COLONOSCOPY WITH PROPOFOL N/A 08/19/2020   Procedure: COLONOSCOPY WITH PROPOFOL;  Surgeon: Regis Bill, MD;  Location: [facility] ENDOSCOPY;  Service: Endoscopy;  Laterality: N/A;  [facility] COVID POSITIVE TEST - ATTACHED TO CHART   ESOPHAGOGASTRODUODENOSCOPY N/A 10/11/2021   Procedure: ESOPHAGOGASTRODUODENOSCOPY (EGD);  Surgeon: Earline Mayotte, MD;  Location: [facility] ENDOSCOPY;  Service: Endoscopy;  Laterality: N/A;   ESOPHAGOGASTRODUODENOSCOPY (EGD) WITH PROPOFOL N/A 08/19/2020   Procedure: ESOPHAGOGASTRODUODENOSCOPY (EGD) WITH PROPOFOL;  Surgeon: Regis Bill, MD;  Location: [facility] ENDOSCOPY;  Service: Endoscopy;  Laterality: N/A;    FEMORAL HERNIA REPAIR Right 01/15/2017   Procedure: HERNIA REPAIR FEMORAL;  Surgeon: Nadeen Landau, MD;  Location: [facility] ORS;  Service: General;  Laterality: Right;   HERNIA REPAIR Right    ROTATOR CUFF REPAIR Right    TONSILLECTOMY     VAGINAL HYSTERECTOMY     1960's    Social History   Socioeconomic History   Marital status: Single    Spouse name: Not on file   Number of children: Not on file   Years of education: Not on file   Highest education level: Not on file  Occupational History   Not on file  Tobacco Use   Smoking status: Former    Packs/day: 0.50    Years: 30.00    Additional pack years: 0.00    Total pack years: 15.00    Types: Cigarettes    Quit date: 02/24/2009    Years since quitting: 13.8   Smokeless tobacco: Never  Vaping Use   Vaping Use: Never used  Substance and Sexual Activity   Alcohol use: No   Drug use: No   Sexual activity: Not on file  Other Topics Concern   Not on file  Social History Narrative   Not on file   Social Determinants of Health   Financial Resource Strain: Not on file  Food Insecurity: Not on file  Transportation Needs: Not on file  Physical Activity: Not on file  Stress: Not on file  Social Connections: Not on file  Intimate Partner Violence: Not on file     Allergies  Allergen Reactions   Doxycycline Nausea Only   Fluticasone-Salmeterol Other (See Comments)    Thrush   Amoxicillin-Pot Clavulanate Nausea And Vomiting and Nausea Only    Has patient had a PCN reaction causing immediate rash, facial/tongue/throat swelling, SOB or lightheadedness with hypotension: No  Has patient had a PCN reaction causing severe rash involving mucus membranes or skin necrosis: No  Has patient had a PCN reaction that required hospitalization: No  Has patient had a PCN reaction occurring within the last 10 years: No  If all of the  above answers are "NO", then may proceed with Cephalosporin use.   Diazepam Other (See Comments)     Reaction:  Hallucinations   Escitalopram Oxalate Diarrhea     CBC    Component Value Date/Time   WBC 8.3 06/25/2021 1221   RBC 4.16 06/25/2021 1221   HGB 13.2 06/25/2021 1221   HGB 13.6 01/31/2014 1120   HCT 39.1 06/25/2021 1221   HCT 41.4 01/31/2014 1120   PLT 209 06/25/2021 1221   PLT 178 01/31/2014 1120   MCV 94.0 06/25/2021 1221   MCV 97 01/31/2014 1120   MCH 31.7 06/25/2021 1221   MCHC 33.8 06/25/2021 1221   RDW 11.9 06/25/2021 1221   RDW 12.3 01/31/2014 1120   LYMPHSABS 2.7 02/23/2020 1733   MONOABS 0.8 02/23/2020 1733   EOSABS 0.3 02/23/2020 1733   BASOSABS 0.1 02/23/2020 1733    Pulmonary Functions Testing Results:    Latest Ref Rng & Units 06/22/2015    2:09 PM  PFT Results  FVC-Pre L 3.09  P  FVC-Predicted Pre % 110  P  FVC-Post L 3.20  P  FVC-Predicted Post % 113  P  Pre FEV1/FVC % % 74  P  Post FEV1/FCV % % 72  P  FEV1-Pre L 2.30  P  FEV1-Predicted Pre % 109  P  FEV1-Post L 2.29  P  DLCO uncorrected ml/min/mmHg 17.08  P  DLCO [facility]% % 70  P  DLVA Predicted % 69  P    P Preliminary result    Outpatient Medications Prior to Visit  Medication Sig Dispense Refill   acetaminophen (TYLENOL) 500 MG tablet Take 1,000 mg by mouth 2 (two) times daily as needed for mild pain or headache.     Albuterol Sulfate (PROAIR RESPICLICK) 108 (90 Base) MCG/ACT AEPB Inhale 2 puffs into the lungs every 4 (four) hours as needed. (Patient taking differently: Inhale 2 puffs into the lungs every 4 (four) hours as needed (shortness of breath or wheezing).) 1 each 3   aspirin EC 81 MG tablet Take 81 mg by mouth daily.     benzonatate (TESSALON) 100 MG capsule Take 1 capsule (100 mg total) by mouth 3 (three) times daily as needed for cough. 30 capsule 1   budesonide-formoterol (SYMBICORT) 80-4.5 MCG/ACT inhaler Inhale 2 puffs into the lungs in the morning and at bedtime. Use twice daily, and every 4 hours as needed for wheezing. 1 each 12   calcium-vitamin D (OSCAL WITH D) 500-5  MG-MCG tablet Take 1 tablet by mouth 2 (two) times daily. 60 tablet 0   cetirizine (ZYRTEC) 5 MG tablet Take 5 mg by mouth daily.     chlorpheniramine-HYDROcodone (TUSSIONEX) 10-8 MG/5ML Take 5 mLs by mouth every 12 (twelve) hours as needed for cough. 115 mL 0   cholecalciferol (VITAMIN D3) 25 MCG (1000 UNIT) tablet Take 1,000 Units by mouth daily.     gabapentin (NEURONTIN) 300 MG capsule Take 300 mg by mouth 2 (two) times daily.     ipratropium (ATROVENT) 0.03 % nasal spray Place 2 sprays into both nostrils 2 (two) times daily.     ipratropium-albuterol (DUONEB) 0.5-2.5 (3) MG/3ML SOLN Take 3 mLs by nebulization 4 (four) times daily as needed for shortness of breath or wheezing.     levocetirizine (XYZAL) 5 MG tablet Take by mouth.     montelukast (SINGULAIR) 10 MG tablet Take 1 tablet (10 mg total) by mouth at bedtime. 30 tablet 5   ondansetron (ZOFRAN) 4 MG tablet Take  1 tablet (4 mg total) by mouth every 6 (six) hours as needed for nausea. 20 tablet 0   sertraline (ZOLOFT) 50 MG tablet Take 50 mg by mouth daily.     vitamin B-12 (CYANOCOBALAMIN) 1000 MCG tablet Take 1,000 mcg by mouth daily.     Tiotropium Bromide Monohydrate (SPIRIVA RESPIMAT) 1.25 MCG/ACT AERS INHALE 2 SPRAY(S) BY MOUTH ONCE DAILY 4 g 5   formoterol (PERFOROMIST) 20 MCG/2ML nebulizer solution Inhale into the lungs. (Patient not taking: Reported on 12/14/2022)     No facility-administered medications prior to visit.

## 2023-01-14 ENCOUNTER — Other Ambulatory Visit: Payer: 59

## 2023-04-11 ENCOUNTER — Ambulatory Visit
Admission: EM | Admit: 2023-04-11 | Discharge: 2023-04-11 | Disposition: A | Discharge status: Home / Self Care | Payer: 59 | Attending: Emergency Medicine | Admitting: Emergency Medicine

## 2023-04-11 ENCOUNTER — Encounter: Payer: Self-pay | Admitting: Emergency Medicine

## 2023-04-11 DIAGNOSIS — H66001 Acute suppurative otitis media without spontaneous rupture of ear drum, right ear: Secondary | ICD-10-CM

## 2023-04-11 DIAGNOSIS — J069 Acute upper respiratory infection, unspecified: Secondary | ICD-10-CM | POA: Diagnosis not present

## 2023-04-11 MED ORDER — BENZONATATE 100 MG PO CAPS: 200.0000 mg | ORAL_CAPSULE | Freq: Three times a day (TID) | ORAL | 0 refills | Status: DC

## 2023-04-11 MED ORDER — PROMETHAZINE-DM 6.25-15 MG/5ML PO SYRP: 5.0000 mL | ORAL_SOLUTION | Freq: Four times a day (QID) | ORAL | 0 refills | Status: DC | PRN

## 2023-04-11 MED ORDER — IPRATROPIUM BROMIDE 0.06 % NA SOLN: 2.0000 | Freq: Four times a day (QID) | NASAL | 12 refills | Status: DC

## 2023-04-11 MED ORDER — AZITHROMYCIN 250 MG PO TABS: 250.0000 mg | ORAL_TABLET | Freq: Every day | ORAL | 0 refills | Status: DC

## 2023-04-11 NOTE — ED Triage Notes (Signed)
Pt c/o nasal drainage and some cough x 1 month. She has left ear pain x 3 days.

## 2023-04-11 NOTE — ED Provider Notes (Signed)
[facility]    CSN: [id number] Arrival date & time: 04/11/23  1354      History   Chief Complaint Chief Complaint  Patient presents with   Otalgia   nasal drainage     HPI Sandy Eaton is a 82 y.o. female.   HPI  82 year old female with a past medical history significant for COPD, GERD, and neuropathy presents for evaluation of 1 month worth of nasal congestion with postnasal drip, milky nasal discharge, and a cough that is also productive for milky sputum.  Mild shortness of breath and wheezing but no fever.  She is also complaining of pain in her right ear.  Past Medical History:  Diagnosis Date   Arthritis    Asthma    Chronic asthmatic bronchitis (HCC)    COPD (chronic obstructive pulmonary disease) (HCC)    Suspected   Depression    Dyspnea    on exertion   Environmental allergies    GERD (gastroesophageal reflux disease)    Glaucoma    Headache    migraine without aura, not intractable   Hyperlipidemia    Osteopenia    of lumbar spine   Pneumonia    3 times    Patient Active Problem List   Diagnosis Date Noted   Chronic rhinitis 07/28/2021   Multiple closed stable fractures of pubic ramus (HCC) 06/26/2021   Multiple closed pelvic fractures without disruption of pelvic circle (HCC)    Nausea    COVID-19 virus infection    Hypoxia    Neuropathy    Pubic ramus fracture (HCC) 06/25/2021   GERD (gastroesophageal reflux disease) 06/25/2021   COPD (chronic obstructive pulmonary disease) (HCC) 01/02/2019   Pneumonia 05/05/2015   Hospital acquired PNA 11/26/2014   DOE (dyspnea on exertion) 02/24/2014   Cough 02/24/2014    Past Surgical History:  Procedure Laterality Date   AMPUTATION TOE     APPENDECTOMY     COLONOSCOPY WITH PROPOFOL N/A 08/19/2020   Procedure: COLONOSCOPY WITH PROPOFOL;  Surgeon: Regis Bill, MD;  Location: [facility] ENDOSCOPY;  Service: Endoscopy;  Laterality: N/A;  [facility] COVID POSITIVE TEST - ATTACHED TO CHART    ESOPHAGOGASTRODUODENOSCOPY N/A 10/11/2021   Procedure: ESOPHAGOGASTRODUODENOSCOPY (EGD);  Surgeon: Earline Mayotte, MD;  Location: [facility] ENDOSCOPY;  Service: Endoscopy;  Laterality: N/A;   ESOPHAGOGASTRODUODENOSCOPY (EGD) WITH PROPOFOL N/A 08/19/2020   Procedure: ESOPHAGOGASTRODUODENOSCOPY (EGD) WITH PROPOFOL;  Surgeon: Regis Bill, MD;  Location: [facility] ENDOSCOPY;  Service: Endoscopy;  Laterality: N/A;   FEMORAL HERNIA REPAIR Right 01/15/2017   Procedure: HERNIA REPAIR FEMORAL;  Surgeon: Nadeen Landau, MD;  Location: [facility] ORS;  Service: General;  Laterality: Right;   HERNIA REPAIR Right    ROTATOR CUFF REPAIR Right    TONSILLECTOMY     VAGINAL HYSTERECTOMY     1960's    OB History     Gravida  3   Para      Term      Preterm      AB      Living  2      SAB      IAB      Ectopic      Multiple      Live Births               Home Medications    Prior to Admission medications   Medication Sig Start Date End Date Taking? Authorizing Provider  azithromycin (ZITHROMAX Z-PAK) 250 MG tablet Take 1  tablet (250 mg total) by mouth daily. Take 2 tablets on the first day and then 1 tablet daily thereafter for a total of 5 days of treatment. 04/11/23  Yes Becky Augusta, NP  benzonatate (TESSALON) 100 MG capsule Take 2 capsules (200 mg total) by mouth every 8 (eight) hours. 04/11/23  Yes Becky Augusta, NP  ipratropium (ATROVENT) 0.06 % nasal spray Place 2 sprays into both nostrils 4 (four) times daily. 04/11/23  Yes Becky Augusta, NP  promethazine-dextromethorphan (PROMETHAZINE-DM) 6.25-15 MG/5ML syrup Take 5 mLs by mouth 4 (four) times daily as needed. 04/11/23  Yes Becky Augusta, NP  acetaminophen (TYLENOL) 500 MG tablet Take 1,000 mg by mouth 2 (two) times daily as needed for mild pain or headache.    [provider]  Albuterol Sulfate (PROAIR RESPICLICK) 108 (90 Base) MCG/ACT AEPB Inhale 2 puffs into the lungs every 4 (four) hours as needed. Patient  taking differently: Inhale 2 puffs into the lungs every 4 (four) hours as needed (shortness of breath or wheezing). 09/27/20   Glenford Bayley, NP  aspirin EC 81 MG tablet Take 81 mg by mouth daily.    [provider]  budesonide-formoterol (SYMBICORT) 80-4.5 MCG/ACT inhaler Inhale 2 puffs into the lungs in the morning and at bedtime. Use twice daily, and every 4 hours as needed for wheezing. 09/25/22 09/25/23  Raechel Chute, MD  calcium-vitamin D (OSCAL WITH D) 500-5 MG-MCG tablet Take 1 tablet by mouth 2 (two) times daily. 06/27/21   Alford Highland, MD  cetirizine (ZYRTEC) 5 MG tablet Take 5 mg by mouth daily.    [provider]  chlorpheniramine-HYDROcodone (TUSSIONEX) 10-8 MG/5ML Take 5 mLs by mouth every 12 (twelve) hours as needed for cough. 08/03/22   Valinda Hoar, NP  cholecalciferol (VITAMIN D3) 25 MCG (1000 UNIT) tablet Take 1,000 Units by mouth daily.    [provider]  formoterol (PERFOROMIST) 20 MCG/2ML nebulizer solution Inhale into the lungs. Patient not taking: Reported on 12/14/2022 12/22/21 12/22/22  [provider]  gabapentin (NEURONTIN) 300 MG capsule Take 300 mg by mouth 2 (two) times daily.    [provider]  ipratropium-albuterol (DUONEB) 0.5-2.5 (3) MG/3ML SOLN Take 3 mLs by nebulization 4 (four) times daily as needed for shortness of breath or wheezing.    [provider]  levocetirizine (XYZAL) 5 MG tablet Take by mouth. 04/17/22   [provider]  montelukast (SINGULAIR) 10 MG tablet Take 1 tablet (10 mg total) by mouth at bedtime. 09/27/20   Glenford Bayley, NP  ondansetron (ZOFRAN) 4 MG tablet Take 1 tablet (4 mg total) by mouth every 6 (six) hours as needed for nausea. 06/27/21   Alford Highland, MD  sertraline (ZOLOFT) 50 MG tablet Take 50 mg by mouth daily. 07/23/19   [provider]  vitamin B-12 (CYANOCOBALAMIN) 1000 MCG tablet Take 1,000 mcg by mouth daily.    [provider]     Family History Family History  Problem Relation Age of Onset   COPD Father    Asthma Daughter    Asthma Grandchild    Cancer Daughter        breast   Breast cancer Daughter    Other Mother        MVA    Social History Social History   Tobacco Use   Smoking status: Former    Current packs/day: 0.00    Average packs/day: 0.5 packs/day for 30.0 years (15.0 ttl pk-yrs)    Types: Cigarettes  Start date: 02/25/1979    Quit date: 02/24/2009    Years since quitting: 14.1   Smokeless tobacco: Never  Vaping Use   Vaping status: Never Used  Substance Use Topics   Alcohol use: No   Drug use: No     Allergies   Doxycycline, Fluticasone-salmeterol, Amoxicillin-pot clavulanate, Diazepam, and Escitalopram oxalate   Review of Systems Review of Systems  Constitutional:  Negative for fever.  HENT:  Positive for congestion, ear pain, postnasal drip and rhinorrhea.   Respiratory:  Positive for cough, shortness of breath and wheezing.      Physical Exam Triage Vital Signs ED Triage Vitals  Encounter Vitals Group     BP      Systolic BP Percentile      Diastolic BP Percentile      Pulse      Resp      Temp      Temp src      SpO2      Weight      Height      Head Circumference      Peak Flow      Pain Score      Pain Loc      Pain Education      Exclude from Growth Chart    No data found.  Updated Vital Signs BP 90/64 (BP Location: Left Arm)   Pulse 90   Temp 98.1 F (36.7 C) (Oral)   Resp 16   SpO2 94%   Visual Acuity Right Eye Distance:   Left Eye Distance:   Bilateral Distance:    Right Eye Near:   Left Eye Near:    Bilateral Near:     Physical Exam Vitals and nursing note reviewed.  Constitutional:      Appearance: Normal appearance. She is not ill-appearing.  HENT:     Head: Normocephalic and atraumatic.     Right Ear: Ear canal and external ear normal. There is no impacted cerumen.     Left Ear: Tympanic membrane, ear canal and external  ear normal. There is no impacted cerumen.     Ears:     Comments: Right tympanic membrane is erythematous with a serous effusion present.    Nose: Congestion and rhinorrhea present.     Comments: Nasal mucosa is mildly edematous with scant clear discharge in both nares.    Mouth/Throat:     Mouth: Mucous membranes are moist.     Pharynx: Oropharynx is clear. No oropharyngeal exudate or posterior oropharyngeal erythema.  Cardiovascular:     Rate and Rhythm: Normal rate and regular rhythm.     Pulses: Normal pulses.     Heart sounds: Normal heart sounds. No murmur heard.    No friction rub. No gallop.  Pulmonary:     Effort: Pulmonary effort is normal.     Breath sounds: Normal breath sounds. No wheezing, rhonchi or rales.  Musculoskeletal:     Cervical back: Normal range of motion and neck supple.  Lymphadenopathy:     Cervical: No cervical adenopathy.  Skin:    General: Skin is warm and dry.     Capillary Refill: Capillary refill takes less than 2 seconds.     Findings: No rash.  Neurological:     General: No focal deficit present.     Mental Status: She is alert and oriented to person, place, and time.      UC Treatments / Results  Labs (all labs ordered are listed,  but only abnormal results are displayed) Labs Reviewed - No data to display  EKG   Radiology No results found.  Procedures Procedures (including critical care time)  Medications Ordered in UC Medications - No data to display  Initial Impression / Assessment and Plan / UC Course  I have reviewed the triage vital signs and the nursing notes.  Pertinent labs & imaging results that were available during my care of the patient were reviewed by me and considered in my medical decision making (see chart for details).   Patient is a nontoxic-appearing 82 year old female presenting for evaluation of 1 month with the respiratory symptoms as outlined HPI above.  On exam she does have otitis media present in the  right ear as well as inflamed this mucosa but her nasal discharge is minimal and clear.  Oropharyngeal exam is benign.  Cardiopulmonary is also benign.  Patient exam is consistent with an upper respiratory infection and otitis media and I will treat her with azithromycin daily for 5 days.  Also Atrovent nasal spray to help the nasal congestion postnasal drip, Tessalon Perles Promethazine DM cough syrup Cough congestion.  She can continue to use her inhalers as previously prescribed by pulmonology for any shortness of breath or wheezing.  Return precautions reviewed.   Final Clinical Impressions(s) / UC Diagnoses   Final diagnoses:  Upper respiratory tract infection, unspecified type  Non-recurrent acute suppurative otitis media of right ear without spontaneous rupture of tympanic membrane     Discharge Instructions      Take the azithromycin according to the package instructions.  You will take it once daily for 5 days for treatment of your ear infection.  Use the Atrovent nasal spray, 2 squirts in each nostril every 6 hours, as needed for runny nose and postnasal drip.  Use the Tessalon Perles every 8 hours during the day.  Take them with a small sip of water.  They may give you some numbness to the base of your tongue or a metallic taste in your mouth, this is normal.  Use the Promethazine DM cough syrup at bedtime for cough and congestion.  It will make you drowsy so do not take it during the day.  Continue to use your prescribed inhalers and nebulizer as needed for shortness of breath or wheezing.  Return for reevaluation or see your primary care provider for any new or worsening symptoms.      ED Prescriptions     Medication Sig Dispense Auth. Provider   azithromycin (ZITHROMAX Z-PAK) 250 MG tablet Take 1 tablet (250 mg total) by mouth daily. Take 2 tablets on the first day and then 1 tablet daily thereafter for a total of 5 days of treatment. 6 tablet Becky Augusta, NP    benzonatate (TESSALON) 100 MG capsule Take 2 capsules (200 mg total) by mouth every 8 (eight) hours. 21 capsule Becky Augusta, NP   ipratropium (ATROVENT) 0.06 % nasal spray Place 2 sprays into both nostrils 4 (four) times daily. 15 mL Becky Augusta, NP   promethazine-dextromethorphan (PROMETHAZINE-DM) 6.25-15 MG/5ML syrup Take 5 mLs by mouth 4 (four) times daily as needed. 118 mL Becky Augusta, NP      PDMP not reviewed this encounter.   Becky Augusta, NP 04/11/23 [id number]

## 2023-04-11 NOTE — Discharge Instructions (Addendum)
Take the azithromycin according to the package instructions.  You will take it once daily for 5 days for treatment of your ear infection.  Use the Atrovent nasal spray, 2 squirts in each nostril every 6 hours, as needed for runny nose and postnasal drip.  Use the Tessalon Perles every 8 hours during the day.  Take them with a small sip of water.  They may give you some numbness to the base of your tongue or a metallic taste in your mouth, this is normal.  Use the Promethazine DM cough syrup at bedtime for cough and congestion.  It will make you drowsy so do not take it during the day.  Continue to use your prescribed inhalers and nebulizer as needed for shortness of breath or wheezing.  Return for reevaluation or see your primary care provider for any new or worsening symptoms.

## 2023-04-20 ENCOUNTER — Ambulatory Visit
Admission: EM | Admit: 2023-04-20 | Discharge: 2023-04-20 | Disposition: A | Discharge status: Home / Self Care | Payer: 59 | Attending: Family Medicine | Admitting: Family Medicine

## 2023-04-20 DIAGNOSIS — J329 Chronic sinusitis, unspecified: Secondary | ICD-10-CM | POA: Diagnosis not present

## 2023-04-20 DIAGNOSIS — J310 Chronic rhinitis: Secondary | ICD-10-CM | POA: Diagnosis not present

## 2023-04-20 DIAGNOSIS — H6991 Unspecified Eustachian tube disorder, right ear: Secondary | ICD-10-CM | POA: Diagnosis not present

## 2023-04-20 MED ORDER — PREDNISONE 10 MG (21) PO TBPK: ORAL_TABLET | Freq: Every day | ORAL | 0 refills | Status: DC

## 2023-04-20 MED ORDER — LEVOFLOXACIN 500 MG PO TABS
500.0000 mg | ORAL_TABLET | Freq: Every day | ORAL | 0 refills | Status: AC
Start: 2023-04-20 — End: 2023-04-27

## 2023-04-20 NOTE — Discharge Instructions (Addendum)
Stop by the pharmacy to pick up your prescriptions. Follow up with your ear, nose and throat provider.   Pick up some Kefir or pre/probiotics to help keep your good bacteria around.

## 2023-04-20 NOTE — ED Triage Notes (Signed)
Pt c/o R ear pain x1 wk & congestion x1 mon. Was seen on 10/24 for the same issue & was given zpack,tessalon,nasal spray & promethazine w/o relief.

## 2023-04-20 NOTE — ED Provider Notes (Signed)
[facility]    CSN: [id number] Arrival date & time: 04/20/23  1027      History   Chief Complaint Chief Complaint  Patient presents with   Otalgia   Congestion    HPI Arriyana June Hoefling is a 82 y.o. female.   HPI  History obtained from {source of history:310783}. Charnika presents for right ear pain for the past week with nasal congestion for over a month .  Was seen at the urgent care for the same and given medications without relief.  Her eyes and nose run constantly.  Her right ear is sore.   She has allergies and takes Claritin. Previously on Zrytec. Uses her nose drops.   Seen at [facility] given meds that helped.    Fever : no  Chills: no Sore throat: no   Cough: no Sputum: no Chest tightness: no Shortness of breath: no Wheezing: no  Nasal congestion : no  Rhinorrhea: no Myalgias: no Appetite: normal  Hydration: normal  Abdominal pain: no Nausea: no Vomiting: no Diarrhea: No Rash: No Sleep disturbance: no Headache: no      Past Medical History:  Diagnosis Date   Arthritis    Asthma    Chronic asthmatic bronchitis (HCC)    COPD (chronic obstructive pulmonary disease) (HCC)    Suspected   Depression    Dyspnea    on exertion   Environmental allergies    GERD (gastroesophageal reflux disease)    Glaucoma    Headache    migraine without aura, not intractable   Hyperlipidemia    Osteopenia    of lumbar spine   Pneumonia    3 times    Patient Active Problem List   Diagnosis Date Noted   Chronic rhinitis 07/28/2021   Multiple closed stable fractures of pubic ramus (HCC) 06/26/2021   Multiple closed pelvic fractures without disruption of pelvic circle (HCC)    Nausea    COVID-19 virus infection    Hypoxia    Neuropathy    Pubic ramus fracture (HCC) 06/25/2021   GERD (gastroesophageal reflux disease) 06/25/2021   COPD (chronic obstructive pulmonary disease) (HCC) 01/02/2019   Pneumonia 05/05/2015   Hospital acquired PNA  11/26/2014   DOE (dyspnea on exertion) 02/24/2014   Cough 02/24/2014    Past Surgical History:  Procedure Laterality Date   AMPUTATION TOE     APPENDECTOMY     COLONOSCOPY WITH PROPOFOL N/A 08/19/2020   Procedure: COLONOSCOPY WITH PROPOFOL;  Surgeon: Regis Bill, MD;  Location: [facility] ENDOSCOPY;  Service: Endoscopy;  Laterality: N/A;  [facility] COVID POSITIVE TEST - ATTACHED TO CHART   ESOPHAGOGASTRODUODENOSCOPY N/A 10/11/2021   Procedure: ESOPHAGOGASTRODUODENOSCOPY (EGD);  Surgeon: Earline Mayotte, MD;  Location[facility] ENDOSCOPY;  Service: Endoscopy;  Laterality: N/A;   ESOPHAGOGASTRODUODENOSCOPY (EGD) WITH PROPOFOL N/A 08/19/2020   Procedure: ESOPHAGOGASTRODUODENOSCOPY (EGD) WITH PROPOFOL;  Surgeon: Regis Bill, MD;  Location: [facility] ENDOSCOPY;  Service: Endoscopy;  Laterality: N/A;   FEMORAL HERNIA REPAIR Right 01/15/2017   Procedure: HERNIA REPAIR FEMORAL;  Surgeon: Nadeen Landau, MD;  Location: [facility] ORS;  Service: General;  Laterality: Right;   HERNIA REPAIR Right    ROTATOR CUFF REPAIR Right    TONSILLECTOMY     VAGINAL HYSTERECTOMY     1960's    OB History     Gravida  3   Para      Term      Preterm      AB  Living  2      SAB      IAB      Ectopic      Multiple      Live Births               Home Medications    Prior to Admission medications   Medication Sig Start Date End Date Taking? Authorizing Provider  acetaminophen (TYLENOL) 500 MG tablet Take 1,000 mg by mouth 2 (two) times daily as needed for mild pain or headache.   Yes [provider]  Albuterol Sulfate (PROAIR RESPICLICK) 108 (90 Base) MCG/ACT AEPB Inhale 2 puffs into the lungs every 4 (four) hours as needed. Patient taking differently: Inhale 2 puffs into the lungs every 4 (four) hours as needed (shortness of breath or wheezing). 09/27/20  Yes Glenford Bayley, NP  aspirin EC 81 MG tablet Take 81 mg by mouth daily.   Yes [provider]   azithromycin (ZITHROMAX Z-PAK) 250 MG tablet Take 1 tablet (250 mg total) by mouth daily. Take 2 tablets on the first day and then 1 tablet daily thereafter for a total of 5 days of treatment. 04/11/23  Yes Becky Augusta, NP  benzonatate (TESSALON) 100 MG capsule Take 2 capsules (200 mg total) by mouth every 8 (eight) hours. 04/11/23  Yes Becky Augusta, NP  budesonide-formoterol [facility]) 80-4.5 MCG/ACT inhaler Inhale 2 puffs into the lungs in the morning and at bedtime. Use twice daily, and every 4 hours as needed for wheezing. 09/25/22 09/25/23 Yes Dgayli, Lianne Bushy, MD  calcium-vitamin D (OSCAL WITH D) 500-5 MG-MCG tablet Take 1 tablet by mouth 2 (two) times daily. 06/27/21  Yes Wieting, Richard, MD  cetirizine (ZYRTEC) 5 MG tablet Take 5 mg by mouth daily.   Yes [provider]  cholecalciferol (VITAMIN D3) 25 MCG (1000 UNIT) tablet Take 1,000 Units by mouth daily.   Yes [provider]  gabapentin (NEURONTIN) 300 MG capsule Take 300 mg by mouth 2 (two) times daily.   Yes [provider]  ipratropium (ATROVENT) 0.06 % nasal spray Place 2 sprays into both nostrils 4 (four) times daily. 04/11/23  Yes Becky Augusta, NP  ipratropium-albuterol (DUONEB) 0.5-2.5 (3) MG/3ML SOLN Take 3 mLs by nebulization 4 (four) times daily as needed for shortness of breath or wheezing.   Yes [provider]  ketorolac (ACULAR) 0.5 % ophthalmic solution Place 1 drop into the right eye 4 (four) times daily. 02/07/23  Yes [provider]  levocetirizine (XYZAL) 5 MG tablet Take by mouth. 04/17/22  Yes [provider]  montelukast (SINGULAIR) 10 MG tablet Take 1 tablet (10 mg total) by mouth at bedtime. 09/27/20  Yes Glenford Bayley, NP  moxifloxacin (VIGAMOX) 0.5 % ophthalmic solution Place 1 drop into the right eye 4 (four) times daily. 02/07/23  Yes [provider]  omeprazole (PRILOSEC) 40 MG capsule SMARTSIG:1 Capsule(s) By Mouth Every Evening 12/17/22  Yes [provider]  ondansetron (ZOFRAN) 4 MG tablet Take 1 tablet (4 mg total) by mouth every 6 (six) hours as needed for nausea. 06/27/21  Yes Wieting, Richard, MD  promethazine-dextromethorphan (PROMETHAZINE-DM) 6.25-15 MG/5ML syrup Take 5 mLs by mouth 4 (four) times daily as needed. 04/11/23  Yes Becky Augusta, NP  sertraline (ZOLOFT) 50 MG tablet Take 50 mg by mouth daily. 07/23/19  Yes [provider]  vitamin B-12 (CYANOCOBALAMIN) 1000 MCG tablet Take 1,000 mcg by mouth daily.   Yes [provider]  chlorpheniramine-HYDROcodone (TUSSIONEX) 10-8  MG/5ML Take 5 mLs by mouth every 12 (twelve) hours as needed for cough. 08/03/22   White, Elita Boone, NP  formoterol (PERFOROMIST) 20 MCG/2ML nebulizer solution Inhale into the lungs. Patient not taking: Reported on 12/14/2022 12/22/21 12/22/22  [provider]    Family History Family History  Problem Relation Age of Onset   COPD Father    Asthma Daughter    Asthma Grandchild    Cancer Daughter        breast   Breast cancer Daughter    Other Mother        MVA    Social History Social History   Tobacco Use   Smoking status: Former    Current packs/day: 0.00    Average packs/day: 0.5 packs/day for 30.0 years (15.0 ttl pk-yrs)    Types: Cigarettes    Start date: 02/25/1979    Quit date: 02/24/2009    Years since quitting: 14.1   Smokeless tobacco: Never  Vaping Use   Vaping status: Never Used  Substance Use Topics   Alcohol use: No   Drug use: No     Allergies   Doxycycline, Fluticasone-salmeterol, Amoxicillin-pot clavulanate, Diazepam, and Escitalopram oxalate   Review of Systems Review of Systems: negative unless otherwise stated in HPI.      Physical Exam Triage Vital Signs ED Triage Vitals  Encounter Vitals Group     BP 04/20/23 1038 107/80     Systolic BP Percentile --      Diastolic BP Percentile --      Pulse Rate 04/20/23 1038 83     Resp 04/20/23 1038 16     Temp 04/20/23 1038 98.5 F (36.9  C)     Temp Source 04/20/23 1038 Oral     SpO2 04/20/23 1038 95 %     Weight 04/20/23 1035 145 lb (65.8 kg)     Height 04/20/23 1035 5\' 4"  (1.626 m)     Head Circumference --      Peak Flow --      Pain Score 04/20/23 1044 6     Pain Loc --      Pain Education --      Exclude from Growth Chart --    No data found.  Updated Vital Signs BP 107/80 (BP Location: Left Arm)   Pulse 83   Temp 98.5 F (36.9 C) (Oral)   Resp 16   Ht 5\' 4"  (1.626 m)   Wt 65.8 kg   SpO2 95%   BMI 24.89 kg/m   Visual Acuity Right Eye Distance:   Left Eye Distance:   Bilateral Distance:    Right Eye Near:   Left Eye Near:    Bilateral Near:     Physical Exam GEN:     alert, non-toxic appearing female in no distress ***   HENT:  mucus membranes moist, oropharyngeal ***without lesions or ***erythema, no*** tonsillar hypertrophy or exudates, *** moderate erythematous edematous turbinates, ***clear nasal discharge, ***bilateral TM normal EYES:   pupils equal and reactive, ***no scleral injection or discharge NECK:  normal ROM, no ***lymphadenopathy, ***no meningismus   RESP:  no increased work of breathing, ***clear to auscultation bilaterally CVS:   regular rate ***and rhythm Skin:   warm and dry, no rash on visible skin***    UC Treatments / Results  Labs (all labs ordered are listed, but only abnormal results are displayed) Labs Reviewed - No data to display  EKG   Radiology No results found.  Procedures  Procedures (including critical care time)  Medications Ordered in UC Medications - No data to display  Initial Impression / Assessment and Plan / UC Course  I have reviewed the triage vital signs and the nursing notes.  Pertinent labs & imaging results that were available during my care of the patient were reviewed by me and considered in my medical decision making (see chart for details).       Pt is a 82 y.o. female who presents for *** days of respiratory symptoms.    Per urgent care note from 7  Kimetha is ***afebrile here without recent antipyretics. Satting well on room air. Overall pt is ***non-toxic appearing, well hydrated, without respiratory distress. Pulmonary exam ***is unremarkable.  COVID testing obtained ***and was negative. ***Pt to quarantine until COVID test results or longer if positive.  I will call patient with test results, if positive. History consistent with ***viral respiratory illness. Discussed symptomatic treatment.  Explained lack of efficacy of antibiotics in viral disease.  Typical duration of symptoms discussed.   Return and ED precautions given and voiced understanding. Discussed MDM, treatment plan and plan for follow-up with patient*** who agrees with plan.     Final Clinical Impressions(s) / UC Diagnoses   Final diagnoses:  None   Discharge Instructions   None    ED Prescriptions   None    PDMP not reviewed this encounter.

## 2023-05-31 ENCOUNTER — Ambulatory Visit
Admission: EM | Admit: 2023-05-31 | Discharge: 2023-05-31 | Disposition: A | Discharge status: Home / Self Care | Payer: 59 | Attending: Family Medicine | Admitting: Family Medicine

## 2023-05-31 ENCOUNTER — Encounter: Payer: Self-pay | Admitting: Emergency Medicine

## 2023-05-31 DIAGNOSIS — Z20822 Contact with and (suspected) exposure to covid-19: Secondary | ICD-10-CM

## 2023-05-31 DIAGNOSIS — R0981 Nasal congestion: Secondary | ICD-10-CM | POA: Diagnosis present

## 2023-05-31 DIAGNOSIS — R052 Subacute cough: Secondary | ICD-10-CM | POA: Diagnosis not present

## 2023-05-31 DIAGNOSIS — S29019A Strain of muscle and tendon of unspecified wall of thorax, initial encounter: Secondary | ICD-10-CM | POA: Diagnosis not present

## 2023-05-31 LAB — SARS CORONAVIRUS 2 BY RT PCR: SARS Coronavirus 2 by RT PCR: NEGATIVE

## 2023-05-31 MED ORDER — METHOCARBAMOL 500 MG PO TABS
500.0000 mg | ORAL_TABLET | Freq: Every evening | ORAL | 0 refills | Status: DC | PRN
Start: 2023-05-31 — End: 2024-03-01

## 2023-05-31 MED ORDER — IPRATROPIUM BROMIDE 0.06 % NA SOLN: 2.0000 | Freq: Four times a day (QID) | NASAL | 12 refills | Status: DC

## 2023-05-31 MED ORDER — PREDNISONE 10 MG (21) PO TBPK: ORAL_TABLET | Freq: Every day | ORAL | 0 refills | Status: DC

## 2023-05-31 NOTE — ED Triage Notes (Signed)
Patient c/o nasal congestion, sore throat, headache, bodyaches that started last month.  Patient reports chills.  Patient unsure of fevers.

## 2023-05-31 NOTE — ED Provider Notes (Addendum)
[facility]    CSN: [id number] Arrival date & time: 05/31/23  1004      History   Chief Complaint Chief Complaint  Patient presents with   Headache   Sore Throat   Cough    HPI Sandy Eaton is a 82 y.o. female.   HPI  History obtained from the patient and daughter . Sandy Eaton presents for headache, sore throat, cough, rhinorrhea, watery eyes and nasal congestion. Endorses body aches and chills.  Has upper back pain. Symptoms started last month but recently was exposed to COVID by her grandson. Requests COVID test.  Has COPD/asthma.   Took allergy medication which helps a little bit. DayQuil, Atrovent nasal spray, help somewhat but doesn't take symptoms away. Continues to have bilateral ear discomfort but this is not new. She hasn't seen her ENT since her last visit here.       Past Medical History:  Diagnosis Date   Arthritis    Asthma    Chronic asthmatic bronchitis (HCC)    COPD (chronic obstructive pulmonary disease) (HCC)    Suspected   Depression    Dyspnea    on exertion   Environmental allergies    GERD (gastroesophageal reflux disease)    Glaucoma    Headache    migraine without aura, not intractable   Hyperlipidemia    Osteopenia    of lumbar spine   Pneumonia    3 times    Patient Active Problem List   Diagnosis Date Noted   Chronic rhinitis 07/28/2021   Multiple closed stable fractures of pubic ramus (HCC) 06/26/2021   Multiple closed pelvic fractures without disruption of pelvic circle (HCC)    Nausea    COVID-19 virus infection    Hypoxia    Neuropathy    Pubic ramus fracture (HCC) 06/25/2021   GERD (gastroesophageal reflux disease) 06/25/2021   COPD (chronic obstructive pulmonary disease) (HCC) 01/02/2019   Pneumonia 05/05/2015   Hospital acquired PNA 11/26/2014   DOE (dyspnea on exertion) 02/24/2014   Cough 02/24/2014    Past Surgical History:  Procedure Laterality Date   AMPUTATION TOE     APPENDECTOMY      COLONOSCOPY WITH PROPOFOL N/A 08/19/2020   Procedure: COLONOSCOPY WITH PROPOFOL;  Surgeon: Regis Bill, MD;  Location: [facility] ENDOSCOPY;  Service: Endoscopy;  Laterality: N/A;  [facility] COVID POSITIVE TEST - ATTACHED TO CHART   ESOPHAGOGASTRODUODENOSCOPY N/A 10/11/2021   Procedure: ESOPHAGOGASTRODUODENOSCOPY (EGD);  Surgeon: Earline Mayotte, MD;  Location: [facility] ENDOSCOPY;  Service: Endoscopy;  Laterality: N/A;   ESOPHAGOGASTRODUODENOSCOPY (EGD) WITH PROPOFOL N/A 08/19/2020   Procedure: ESOPHAGOGASTRODUODENOSCOPY (EGD) WITH PROPOFOL;  Surgeon: Regis Bill, MD;  Location: [facility] ENDOSCOPY;  Service: Endoscopy;  Laterality: N/A;   FEMORAL HERNIA REPAIR Right 01/15/2017   Procedure: HERNIA REPAIR FEMORAL;  Surgeon: Nadeen Landau, MD;  Location: [facility] ORS;  Service: General;  Laterality: Right;   HERNIA REPAIR Right    ROTATOR CUFF REPAIR Right    TONSILLECTOMY     VAGINAL HYSTERECTOMY     1960's    OB History     Gravida  3   Para      Term      Preterm      AB      Living  2      SAB      IAB      Ectopic      Multiple      Live Births  Home Medications    Prior to Admission medications   Medication Sig Start Date End Date Taking? Authorizing Provider  methocarbamol (ROBAXIN) 500 MG tablet Take 1 tablet (500 mg total) by mouth at bedtime as needed for muscle spasms. 05/31/23  Yes Makai Agostinelli, DO  sertraline (ZOLOFT) 50 MG tablet Take 50 mg by mouth daily. 07/23/19  Yes [provider]  acetaminophen (TYLENOL) 500 MG tablet Take 1,000 mg by mouth 2 (two) times daily as needed for mild pain or headache.    [provider]  Albuterol Sulfate (PROAIR RESPICLICK) 108 (90 Base) MCG/ACT AEPB Inhale 2 puffs into the lungs every 4 (four) hours as needed. Patient taking differently: Inhale 2 puffs into the lungs every 4 (four) hours as needed (shortness of breath or wheezing). 09/27/20   Glenford Bayley, NP  aspirin EC 81  MG tablet Take 81 mg by mouth daily.    [provider]  benzonatate (TESSALON) 100 MG capsule Take 2 capsules (200 mg total) by mouth every 8 (eight) hours. 04/11/23   Becky Augusta, NP  budesonide-formoterol [facility]) 80-4.5 MCG/ACT inhaler Inhale 2 puffs into the lungs in the morning and at bedtime. Use twice daily, and every 4 hours as needed for wheezing. 09/25/22 09/25/23  Raechel Chute, MD  calcium-vitamin D (OSCAL WITH D) 500-5 MG-MCG tablet Take 1 tablet by mouth 2 (two) times daily. 06/27/21   Alford Highland, MD  cetirizine (ZYRTEC) 5 MG tablet Take 5 mg by mouth daily.    [provider]  cholecalciferol (VITAMIN D3) 25 MCG (1000 UNIT) tablet Take 1,000 Units by mouth daily.    [provider]  formoterol (PERFOROMIST) 20 MCG/2ML nebulizer solution Inhale into the lungs. Patient not taking: Reported on 12/14/2022 12/22/21 12/22/22  [provider]  gabapentin (NEURONTIN) 300 MG capsule Take 300 mg by mouth 2 (two) times daily.    [provider]  ipratropium (ATROVENT) 0.06 % nasal spray Place 2 sprays into both nostrils 4 (four) times daily. 05/31/23   Perlie Scheuring, Seward Meth, DO  ipratropium-albuterol (DUONEB) 0.5-2.5 (3) MG/3ML SOLN Take 3 mLs by nebulization 4 (four) times daily as needed for shortness of breath or wheezing.    [provider]  ketorolac (ACULAR) 0.5 % ophthalmic solution Place 1 drop into the right eye 4 (four) times daily. 02/07/23   [provider]  levocetirizine (XYZAL) 5 MG tablet Take by mouth. 04/17/22   [provider]  montelukast (SINGULAIR) 10 MG tablet Take 1 tablet (10 mg total) by mouth at bedtime. 09/27/20   Glenford Bayley, NP  moxifloxacin (VIGAMOX) 0.5 % ophthalmic solution Place 1 drop into the right eye 4 (four) times daily. 02/07/23   [provider]  omeprazole (PRILOSEC) 40 MG capsule SMARTSIG:1 Capsule(s) By Mouth Every Evening 12/17/22   [provider]  ondansetron  (ZOFRAN) 4 MG tablet Take 1 tablet (4 mg total) by mouth every 6 (six) hours as needed for nausea. 06/27/21   Alford Highland, MD  predniSONE (STERAPRED UNI-PAK 21 TAB) 10 MG (21) TBPK tablet Take by mouth daily. Take 6 tabs by mouth daily for 1, then 5 tabs for 1 day, then 4 tabs for 1 day, then 3 tabs for 1 day, then 2 tabs for 1 day, then 1 tab for 1 day. 05/31/23   Katha Cabal, DO  promethazine-dextromethorphan (PROMETHAZINE-DM) 6.25-15 MG/5ML syrup Take 5 mLs by mouth 4 (four) times daily as needed. 04/11/23   Becky Augusta, NP  vitamin B-12 (CYANOCOBALAMIN) 1000  MCG tablet Take 1,000 mcg by mouth daily.    [provider]    Family History Family History  Problem Relation Age of Onset   COPD Father    Asthma Daughter    Asthma Grandchild    Cancer Daughter        breast   Breast cancer Daughter    Other Mother        MVA    Social History Social History   Tobacco Use   Smoking status: Former    Current packs/day: 0.00    Average packs/day: 0.5 packs/day for 30.0 years (15.0 ttl pk-yrs)    Types: Cigarettes    Start date: 02/25/1979    Quit date: 02/24/2009    Years since quitting: 14.2   Smokeless tobacco: Never  Vaping Use   Vaping status: Never Used  Substance Use Topics   Alcohol use: No   Drug use: No     Allergies   Doxycycline, Fluticasone-salmeterol, Levaquin [levofloxacin], Amoxicillin-pot clavulanate, Diazepam, and Escitalopram oxalate   Review of Systems Review of Systems: negative unless otherwise stated in HPI.      Physical Exam Triage Vital Signs ED Triage Vitals  Encounter Vitals Group     BP 05/31/23 1032 111/86     Systolic BP Percentile --      Diastolic BP Percentile --      Pulse Rate 05/31/23 1032 99     Resp 05/31/23 1032 14     Temp 05/31/23 1032 97.9 F (36.6 C)     Temp Source 05/31/23 1032 Oral     SpO2 05/31/23 1032 95 %     Weight 05/31/23 1030 145 lb 1 oz (65.8 kg)     Height 05/31/23 1030 5\' 4"  (1.626 m)      Head Circumference --      Peak Flow --      Pain Score 05/31/23 1029 8     Pain Loc --      Pain Education --      Exclude from Growth Chart --    No data found.  Updated Vital Signs BP 111/86 (BP Location: Left Arm)   Pulse 99   Temp 97.9 F (36.6 C) (Oral)   Resp 14   Ht 5\' 4"  (1.626 m)   Wt 65.8 kg   SpO2 95%   BMI 24.90 kg/m   Visual Acuity Right Eye Distance:   Left Eye Distance:   Bilateral Distance:    Right Eye Near:   Left Eye Near:    Bilateral Near:     Physical Exam GEN:     alert, pleasant chronically ill-appearing elderly female in no distress   HENT:  mucus membranes moist, oropharyngeal without lesions or erythema, no tonsillar hypertrophy or exudates, clear nasal discharge, bilateral TM normal EYES:   no scleral injection or discharge NECK:  good ROM, no midline C-spine tenderness RESP:  no increased work of breathing, clear to auscultation bilaterally CVS:   regular rate and rhythm MSK back:  Lumbar spine: - Inspection: no gross deformity or asymmetry, swelling or ecchymosis. No skin changes - Palpation: No TTP over the thoracic or lumbar spinous processes, paraspinal muscles tenderness of the right thoracic spine - ROM: at baseline  - Strength:at baseline  - Neuro: sensation intact  Skin:   warm and dry, no rash on visible skin    UC Treatments / Results  Labs (all labs ordered are listed, but only abnormal results are displayed) Labs Reviewed  SARS CORONAVIRUS 2 BY RT PCR    EKG   Radiology No results found.  Procedures Procedures (including critical care time)  Medications Ordered in UC Medications - No data to display  Initial Impression / Assessment and Plan / UC Course  I have reviewed the triage vital signs and the nursing notes.  Pertinent labs & imaging results that were available during my care of the patient were reviewed by me and considered in my medical decision making (see chart for details).       Pt is a 82  y.o. female who presents for respiratory symptoms some of which have been present for month but have been exacerbated by her recent COVID exposure. [place] is afebrile here without recent antipyretics. Satting well on room air. Overall pt is non-toxic appearing, well hydrated, without respiratory distress. Pulmonary exam is unremarkable.  COVID testing obtained and was negative.  After shared decision making chest x-ray deferred.   Prednisone typically helps these symptoms so we will give her another round of prednisone here.  Refilled her Atrovent nasal spray as this has provided her some relief. Recommended she follow up with [facility] who she last saw in April 2024. ENT notes reviewed.   Regarding her thoracic pain, suspect muscular strain. Start Robaxin at bedtime. Advised to sit on the side of the bed prior to moving to help thwart falls.   Return and ED precautions given and voiced understanding. Discussed MDM, treatment plan and plan for follow-up with patient and her daughter who agree with plan.     Final Clinical Impressions(s) / UC Diagnoses   Final diagnoses:  Subacute cough  Close exposure to COVID-19 virus  Acute thoracic myofascial strain, initial encounter  Nasal congestion     Discharge Instructions      Take the muscle relaxer at bedtime to help with your back pain. This may make you sleepy. Sit on the side of the bed for a few minutes before getting up to walk.   Stop by the pharmacy to pick up your prescriptions.  Follow up with your ear, nose and throat provider [facility]). Use your inhalers as discussed and speak with your lung doctor about your cough.   Your COVID test is negative.        ED Prescriptions     Medication Sig Dispense Auth. Provider   ipratropium (ATROVENT) 0.06 % nasal spray Place 2 sprays into both nostrils 4 (four) times daily. 15 mL Arion Morgan, DO   predniSONE (STERAPRED UNI-PAK 21 TAB) 10 MG (21) TBPK tablet Take by mouth daily.  Take 6 tabs by mouth daily for 1, then 5 tabs for 1 day, then 4 tabs for 1 day, then 3 tabs for 1 day, then 2 tabs for 1 day, then 1 tab for 1 day. 21 tablet Lakina Mcintire, DO   methocarbamol (ROBAXIN) 500 MG tablet Take 1 tablet (500 mg total) by mouth at bedtime as needed for muscle spasms. 20 tablet Katha Cabal, DO      PDMP not reviewed this encounter.       Katha Cabal, DO 05/31/23 2024

## 2023-05-31 NOTE — Discharge Instructions (Addendum)
Take the muscle relaxer at bedtime to help with your back pain. This may make you sleepy. Sit on the side of the bed for a few minutes before getting up to walk.   Stop by the pharmacy to pick up your prescriptions.  Follow up with your ear, nose and throat provider [facility]). Use your inhalers as discussed and speak with your lung doctor about your cough.   Your COVID test is negative.

## 2023-07-04 ENCOUNTER — Ambulatory Visit
Admission: EM | Admit: 2023-07-04 | Discharge: 2023-07-04 | Disposition: A | Discharge status: Home / Self Care | Payer: 59 | Attending: Emergency Medicine | Admitting: Emergency Medicine

## 2023-07-04 ENCOUNTER — Encounter: Payer: Self-pay | Admitting: Emergency Medicine

## 2023-07-04 DIAGNOSIS — J069 Acute upper respiratory infection, unspecified: Secondary | ICD-10-CM

## 2023-07-04 MED ORDER — PROMETHAZINE-DM 6.25-15 MG/5ML PO SYRP: 5.0000 mL | ORAL_SOLUTION | Freq: Four times a day (QID) | ORAL | 0 refills | Status: DC | PRN

## 2023-07-04 MED ORDER — IPRATROPIUM BROMIDE 0.06 % NA SOLN: 2.0000 | Freq: Four times a day (QID) | NASAL | 12 refills | Status: DC

## 2023-07-04 MED ORDER — AZITHROMYCIN 250 MG PO TABS: 250.0000 mg | ORAL_TABLET | Freq: Every day | ORAL | 0 refills | Status: DC

## 2023-07-04 MED ORDER — ALBUTEROL SULFATE HFA 108 (90 BASE) MCG/ACT IN AERS
2.0000 | INHALATION_SPRAY | RESPIRATORY_TRACT | 0 refills | Status: DC | PRN
Start: 2023-07-04 — End: 2023-11-11

## 2023-07-04 MED ORDER — BENZONATATE 100 MG PO CAPS: 200.0000 mg | ORAL_CAPSULE | Freq: Three times a day (TID) | ORAL | 0 refills | Status: DC

## 2023-07-04 NOTE — ED Triage Notes (Signed)
Pt c/o SOB, cough and runny nose x 1 month. Pt states her symptoms has gotten worse over the past few days. She has tried OTC cold medication.

## 2023-07-04 NOTE — ED Provider Notes (Signed)
[facility]    CSN: [id number] Arrival date & time: 07/04/23  1523      History   Chief Complaint Chief Complaint  Patient presents with   Nasal Congestion   Cough    HPI Sandy Eaton is a 83 y.o. female.   HPI  83 year old female with past medical history significant for asthma, COPD, GERD, depression, glaucoma, and arthritis presents for evaluation of 1 month worth of respiratory symptoms to include runny nose for clear nasal discharge, cough that is productive for a creamy mucus, shortness breath, and wheezing.  No fevers.  Patient is also endorsing right ear pain.  Past Medical History:  Diagnosis Date   Arthritis    Asthma    Chronic asthmatic bronchitis (HCC)    COPD (chronic obstructive pulmonary disease) (HCC)    Suspected   Depression    Dyspnea    on exertion   Environmental allergies    GERD (gastroesophageal reflux disease)    Glaucoma    Headache    migraine without aura, not intractable   Hyperlipidemia    Osteopenia    of lumbar spine   Pneumonia    3 times    Patient Active Problem List   Diagnosis Date Noted   Chronic rhinitis 07/28/2021   Multiple closed stable fractures of pubic ramus (HCC) 06/26/2021   Multiple closed pelvic fractures without disruption of pelvic circle (HCC)    Nausea    COVID-19 virus infection    Hypoxia    Neuropathy    Pubic ramus fracture (HCC) 06/25/2021   GERD (gastroesophageal reflux disease) 06/25/2021   COPD (chronic obstructive pulmonary disease) (HCC) 01/02/2019   Pneumonia 05/05/2015   Hospital acquired PNA 11/26/2014   DOE (dyspnea on exertion) 02/24/2014   Cough 02/24/2014    Past Surgical History:  Procedure Laterality Date   AMPUTATION TOE     APPENDECTOMY     COLONOSCOPY WITH PROPOFOL N/A 08/19/2020   Procedure: COLONOSCOPY WITH PROPOFOL;  Surgeon: Regis Bill, MD;  Location: [facility] ENDOSCOPY;  Service: Endoscopy;  Laterality: N/A;  [facility] COVID POSITIVE TEST - ATTACHED TO  CHART   ESOPHAGOGASTRODUODENOSCOPY N/A 10/11/2021   Procedure: ESOPHAGOGASTRODUODENOSCOPY (EGD);  Surgeon: Earline Mayotte, MD;  Location: [facility] ENDOSCOPY;  Service: Endoscopy;  Laterality: N/A;   ESOPHAGOGASTRODUODENOSCOPY (EGD) WITH PROPOFOL N/A 08/19/2020   Procedure: ESOPHAGOGASTRODUODENOSCOPY (EGD) WITH PROPOFOL;  Surgeon: Regis Bill, MD;  Location: [facility] ENDOSCOPY;  Service: Endoscopy;  Laterality: N/A;   FEMORAL HERNIA REPAIR Right 01/15/2017   Procedure: HERNIA REPAIR FEMORAL;  Surgeon: Nadeen Landau, MD;  Location: [facility] ORS;  Service: General;  Laterality: Right;   HERNIA REPAIR Right    ROTATOR CUFF REPAIR Right    TONSILLECTOMY     VAGINAL HYSTERECTOMY     1960's    OB History     Gravida  3   Para      Term      Preterm      AB      Living  2      SAB      IAB      Ectopic      Multiple      Live Births               Home Medications    Prior to Admission medications   Medication Sig Start Date End Date Taking? Authorizing Provider  albuterol (VENTOLIN HFA) 108 (90 Base) MCG/ACT inhaler Inhale 2 puffs into the  lungs every 4 (four) hours as needed. 07/04/23  Yes Becky Augusta, NP  azithromycin (ZITHROMAX Z-PAK) 250 MG tablet Take 1 tablet (250 mg total) by mouth daily. Take 2 tablets on the first day and then 1 tablet daily thereafter for a total of 5 days of treatment. 07/04/23  Yes Becky Augusta, NP  benzonatate (TESSALON) 100 MG capsule Take 2 capsules (200 mg total) by mouth every 8 (eight) hours. 07/04/23  Yes Becky Augusta, NP  ipratropium (ATROVENT) 0.06 % nasal spray Place 2 sprays into both nostrils 4 (four) times daily. 07/04/23  Yes Becky Augusta, NP  promethazine-dextromethorphan (PROMETHAZINE-DM) 6.25-15 MG/5ML syrup Take 5 mLs by mouth 4 (four) times daily as needed. 07/04/23  Yes Becky Augusta, NP  acetaminophen (TYLENOL) 500 MG tablet Take 1,000 mg by mouth 2 (two) times daily as needed for mild pain or headache.    [provider]  Albuterol Sulfate (PROAIR RESPICLICK) 108 (90 Base) MCG/ACT AEPB Inhale 2 puffs into the lungs every 4 (four) hours as needed. Patient taking differently: Inhale 2 puffs into the lungs every 4 (four) hours as needed (shortness of breath or wheezing). 09/27/20   Glenford Bayley, NP  aspirin EC 81 MG tablet Take 81 mg by mouth daily.    [provider]  budesonide-formoterol (SYMBICORT) 80-4.5 MCG/ACT inhaler Inhale 2 puffs into the lungs in the morning and at bedtime. Use twice daily, and every 4 hours as needed for wheezing. 09/25/22 09/25/23  Raechel Chute, MD  calcium-vitamin D (OSCAL WITH D) 500-5 MG-MCG tablet Take 1 tablet by mouth 2 (two) times daily. 06/27/21   Alford Highland, MD  cetirizine (ZYRTEC) 5 MG tablet Take 5 mg by mouth daily.    [provider]  cholecalciferol (VITAMIN D3) 25 MCG (1000 UNIT) tablet Take 1,000 Units by mouth daily.    [provider]  formoterol (PERFOROMIST) 20 MCG/2ML nebulizer solution Inhale into the lungs. Patient not taking: Reported on 12/14/2022 12/22/21 12/22/22  [provider]  gabapentin (NEURONTIN) 300 MG capsule Take 300 mg by mouth 2 (two) times daily.    [provider]  ipratropium-albuterol (DUONEB) 0.5-2.5 (3) MG/3ML SOLN Take 3 mLs by nebulization 4 (four) times daily as needed for shortness of breath or wheezing.    [provider]  ketorolac (ACULAR) 0.5 % ophthalmic solution Place 1 drop into the right eye 4 (four) times daily. 02/07/23   [provider]  levocetirizine (XYZAL) 5 MG tablet Take by mouth. 04/17/22   [provider]  methocarbamol (ROBAXIN) 500 MG tablet Take 1 tablet (500 mg total) by mouth at bedtime as needed for muscle spasms. 05/31/23   Brimage, Seward Meth, DO  montelukast (SINGULAIR) 10 MG tablet Take 1 tablet (10 mg total) by mouth at bedtime. 09/27/20   Glenford Bayley, NP  moxifloxacin (VIGAMOX) 0.5 % ophthalmic solution Place 1 drop into  the right eye 4 (four) times daily. 02/07/23   [provider]  omeprazole (PRILOSEC) 40 MG capsule SMARTSIG:1 Capsule(s) By Mouth Every Evening 12/17/22   [provider]  ondansetron (ZOFRAN) 4 MG tablet Take 1 tablet (4 mg total) by mouth every 6 (six) hours as needed for nausea. 06/27/21   Alford Highland, MD  sertraline (ZOLOFT) 50 MG tablet Take 50 mg by mouth daily. 07/23/19   [provider]  vitamin B-12 (CYANOCOBALAMIN) 1000 MCG tablet Take 1,000 mcg by mouth daily.    [provider]    Family History Family History  Problem  Relation Age of Onset   COPD Father    Asthma Daughter    Asthma Grandchild    Cancer Daughter        breast   Breast cancer Daughter    Other Mother        MVA    Social History Social History   Tobacco Use   Smoking status: Former    Current packs/day: 0.00    Average packs/day: 0.5 packs/day for 30.0 years (15.0 ttl pk-yrs)    Types: Cigarettes    Start date: 02/25/1979    Quit date: 02/24/2009    Years since quitting: 14.3   Smokeless tobacco: Never  Vaping Use   Vaping status: Never Used  Substance Use Topics   Alcohol use: No   Drug use: No     Allergies   Doxycycline, Fluticasone-salmeterol, Levaquin [levofloxacin], Amoxicillin-pot clavulanate, Diazepam, and Escitalopram oxalate   Review of Systems Review of Systems  Constitutional:  Negative for fever.  HENT:  Positive for congestion, ear pain and rhinorrhea.   Respiratory:  Positive for cough, shortness of breath and wheezing.      Physical Exam Triage Vital Signs ED Triage Vitals  Encounter Vitals Group     BP      Systolic BP Percentile      Diastolic BP Percentile      Pulse      Resp      Temp      Temp src      SpO2      Weight      Height      Head Circumference      Peak Flow      Pain Score      Pain Loc      Pain Education      Exclude from Growth Chart    No data found.  Updated Vital Signs BP 100/67 (BP Location:  Right Arm)   Pulse 83   Temp 98.2 F (36.8 C) (Oral)   Resp 16   SpO2 94%   Visual Acuity Right Eye Distance:   Left Eye Distance:   Bilateral Distance:    Right Eye Near:   Left Eye Near:    Bilateral Near:     Physical Exam Vitals and nursing note reviewed.  Constitutional:      Appearance: Normal appearance. She is not ill-appearing.  HENT:     Head: Normocephalic and atraumatic.     Right Ear: Tympanic membrane, ear canal and external ear normal. There is no impacted cerumen.     Left Ear: Tympanic membrane, ear canal and external ear normal. There is no impacted cerumen.     Nose: Congestion and rhinorrhea present.     Comments: Nasal mucosa is edematous and erythematous with no acute discharge in both nares.    Mouth/Throat:     Mouth: Mucous membranes are moist.     Pharynx: Oropharynx is clear. Posterior oropharyngeal erythema present. No oropharyngeal exudate.     Comments: Mild erythema to the posterior pharynx with clear postnasal drip. Cardiovascular:     Rate and Rhythm: Normal rate and regular rhythm.     Pulses: Normal pulses.     Heart sounds: Normal heart sounds. No murmur heard.    No friction rub. No gallop.  Pulmonary:     Effort: Pulmonary effort is normal.     Breath sounds: No wheezing, rhonchi or rales.  Musculoskeletal:     Cervical back: Normal range of motion  and neck supple. No tenderness.  Skin:    General: Skin is warm and dry.     Capillary Refill: Capillary refill takes less than 2 seconds.     Findings: No rash.  Neurological:     General: No focal deficit present.     Mental Status: She is alert and oriented to person, place, and time.      UC Treatments / Results  Labs (all labs ordered are listed, but only abnormal results are displayed) Labs Reviewed - No data to display  EKG   Radiology No results found.  Procedures Procedures (including critical care time)  Medications Ordered in UC Medications - No data to  display  Initial Impression / Assessment and Plan / UC Course  I have reviewed the triage vital signs and the nursing notes.  Pertinent labs & imaging results that were available during my care of the patient were reviewed by me and considered in my medical decision making (see chart for details).   Patient is a nontoxic-appearing 83 year old female presenting for evaluation of 1 month worth of respiratory symptoms as outlined in HPI above.  Her physical exam does reveal inflammation of her upper respiratory tract with inflamed nasal mucosa and milky nasal discharge.  Mild erythema to the posterior oropharynx with clear postnasal drip.  Cardiopulmonary exam reveals clear lung sounds all fields.  Patient exam is consistent with an upper respiratory infection.  Given that she has had symptoms for a month a trial of antibiotics is warranted.  She has GI upset with Levaquin, doxycycline, and Augmentin.  I will discharge patient home on azithromycin along with Atrovent nasal spray, Tessalon Perles, and Promethazine DM cough syrup.  Will also prescribe an albuterol inhaler and she can use 1 to 2 puffs every 4-6 hours as needed for shortness of breath or wheezing.   Final Clinical Impressions(s) / UC Diagnoses   Final diagnoses:  URI with cough and congestion     Discharge Instructions      Take the azithromycin once daily for 5 days for treatment of your upper respiratory infection.  Use the albuterol inhaler, 1 to 2 puffs every 4-6 hours as needed for shortness of breath or wheezing.  Use the Atrovent nasal spray, 2 squirts in each nostril every 6 hours, as needed for runny nose and postnasal drip.  Use the Tessalon Perles every 8 hours during the day.  Take them with a small sip of water.  They may give you some numbness to the base of your tongue or a metallic taste in your mouth, this is normal.  Use the Promethazine DM cough syrup at bedtime for cough and congestion.  It will make you  drowsy so do not take it during the day.  Return for reevaluation or see your primary care provider for any new or worsening symptoms.      ED Prescriptions     Medication Sig Dispense Auth. Provider   azithromycin (ZITHROMAX Z-PAK) 250 MG tablet Take 1 tablet (250 mg total) by mouth daily. Take 2 tablets on the first day and then 1 tablet daily thereafter for a total of 5 days of treatment. 6 tablet Becky Augusta, NP   albuterol (VENTOLIN HFA) 108 (90 Base) MCG/ACT inhaler Inhale 2 puffs into the lungs every 4 (four) hours as needed. 18 g Becky Augusta, NP   benzonatate (TESSALON) 100 MG capsule Take 2 capsules (200 mg total) by mouth every 8 (eight) hours. 21 capsule Becky Augusta, NP  ipratropium (ATROVENT) 0.06 % nasal spray Place 2 sprays into both nostrils 4 (four) times daily. 15 mL Becky Augusta, NP   promethazine-dextromethorphan (PROMETHAZINE-DM) 6.25-15 MG/5ML syrup Take 5 mLs by mouth 4 (four) times daily as needed. 118 mL Becky Augusta, NP      PDMP not reviewed this encounter.   Becky Augusta, NP 07/04/23 1556

## 2023-07-04 NOTE — Discharge Instructions (Addendum)
Take the azithromycin once daily for 5 days for treatment of your upper respiratory infection.  Use the albuterol inhaler, 1 to 2 puffs every 4-6 hours as needed for shortness of breath or wheezing.  Use the Atrovent nasal spray, 2 squirts in each nostril every 6 hours, as needed for runny nose and postnasal drip.  Use the Tessalon Perles every 8 hours during the day.  Take them with a small sip of water.  They may give you some numbness to the base of your tongue or a metallic taste in your mouth, this is normal.  Use the Promethazine DM cough syrup at bedtime for cough and congestion.  It will make you drowsy so do not take it during the day.  Return for reevaluation or see your primary care provider for any new or worsening symptoms.

## 2023-09-20 DIAGNOSIS — K580 Irritable bowel syndrome with diarrhea: Secondary | ICD-10-CM | POA: Insufficient documentation

## 2023-11-11 ENCOUNTER — Ambulatory Visit
Admission: EM | Admit: 2023-11-11 | Discharge: 2023-11-11 | Disposition: A | Discharge status: Home / Self Care | Attending: Emergency Medicine | Admitting: Emergency Medicine

## 2023-11-11 ENCOUNTER — Ambulatory Visit (INDEPENDENT_AMBULATORY_CARE_PROVIDER_SITE_OTHER)

## 2023-11-11 DIAGNOSIS — J189 Pneumonia, unspecified organism: Secondary | ICD-10-CM

## 2023-11-11 DIAGNOSIS — R051 Acute cough: Secondary | ICD-10-CM | POA: Diagnosis not present

## 2023-11-11 MED ORDER — DOXYCYCLINE HYCLATE 100 MG PO CAPS: 100.0000 mg | ORAL_CAPSULE | Freq: Two times a day (BID) | ORAL | 0 refills | Status: AC

## 2023-11-11 MED ORDER — ONDANSETRON 8 MG PO TBDP: 8.0000 mg | ORAL_TABLET | Freq: Three times a day (TID) | ORAL | 0 refills | Status: DC | PRN

## 2023-11-11 MED ORDER — ALBUTEROL SULFATE HFA 108 (90 BASE) MCG/ACT IN AERS: 2.0000 | INHALATION_SPRAY | RESPIRATORY_TRACT | 0 refills | Status: AC | PRN

## 2023-11-11 MED ORDER — IPRATROPIUM BROMIDE 0.06 % NA SOLN: 2.0000 | Freq: Four times a day (QID) | NASAL | 12 refills | Status: DC

## 2023-11-11 MED ORDER — AZITHROMYCIN 250 MG PO TABS: 250.0000 mg | ORAL_TABLET | Freq: Every day | ORAL | 0 refills | Status: DC

## 2023-11-11 MED ORDER — BENZONATATE 100 MG PO CAPS: 200.0000 mg | ORAL_CAPSULE | Freq: Three times a day (TID) | ORAL | 0 refills | Status: DC

## 2023-11-11 MED ORDER — PROMETHAZINE-DM 6.25-15 MG/5ML PO SYRP
5.0000 mL | ORAL_SOLUTION | Freq: Four times a day (QID) | ORAL | 0 refills | Status: DC | PRN
Start: 2023-11-11 — End: 2024-03-01

## 2023-11-11 NOTE — Discharge Instructions (Addendum)
 Your chest x-ray shows that you have pneumonia in your right middle lobe.  Take the doxycycline , 100 mg twice daily with food, for 7 days for treatment of your pneumonia.  Take 1 Zofran  oral disintegrating tablet 15 to 20 minutes before each doxycycline  dose to prevent nausea associated with that antibiotic.  Take the azithromycin  once daily for 5 days for treatment of your pneumonia.  Use the albuterol  inhaler, 1 to 2 puffs every 4-6 hours, as needed for any shortness of breath or wheezing.  Use the Atrovent  nasal spray, 2 squirts in each nostril every 6 hours, as needed for runny nose and postnasal drip.  Use the Tessalon  Perles every 8 hours during the day.  Take them with a small sip of water.  They may give you some numbness to the base of your tongue or a metallic taste in your mouth, this is normal.  Use the Promethazine  DM cough syrup at bedtime for cough and congestion.  It will make you drowsy so do not take it during the day.  Return for reevaluation or see your primary care provider for any new or worsening symptoms.

## 2023-11-11 NOTE — ED Provider Notes (Signed)
 [facility]    CSN: [id number] Arrival date & time: 11/11/23  1144      History   Chief Complaint Chief Complaint  Patient presents with   Cough    HPI Sandy Eaton is a 83 y.o. female.   HPI  83 year old female with past medical history significant for COPD, GERD, hyperlipidemia, and arthritis presents for evaluation of respiratory symptoms that been going on for the last 5 days that include headache, body aches, nasal congestion and sinus pressure, sore throat, bilateral ear pain, pleuritic chest pain, cough that is intermittently productive for thick white sputum, shortness breath, and wheezing.  No fever.  She is here with family who reports that they had the same symptoms the week prior.  Past Medical History:  Diagnosis Date   Arthritis    Asthma    Chronic asthmatic bronchitis (HCC)    COPD (chronic obstructive pulmonary disease) (HCC)    Suspected   Depression    Dyspnea    on exertion   Environmental allergies    GERD (gastroesophageal reflux disease)    Glaucoma    Headache    migraine without aura, not intractable   Hyperlipidemia    Osteopenia    of lumbar spine   Pneumonia    3 times    Patient Active Problem List   Diagnosis Date Noted   Chronic rhinitis 07/28/2021   Multiple closed stable fractures of pubic ramus (HCC) 06/26/2021   Multiple closed pelvic fractures without disruption of pelvic circle (HCC)    Nausea    COVID-19 virus infection    Hypoxia    Neuropathy    Pubic ramus fracture (HCC) 06/25/2021   GERD (gastroesophageal reflux disease) 06/25/2021   COPD (chronic obstructive pulmonary disease) (HCC) 01/02/2019   Pneumonia 05/05/2015   Hospital acquired PNA 11/26/2014   DOE (dyspnea on exertion) 02/24/2014   Cough 02/24/2014    Past Surgical History:  Procedure Laterality Date   AMPUTATION TOE     APPENDECTOMY     COLONOSCOPY WITH PROPOFOL  N/A 08/19/2020   Procedure: COLONOSCOPY WITH PROPOFOL ;  Surgeon:  Shane Darling, MD;  Location: [facility] ENDOSCOPY;  Service: Endoscopy;  Laterality: N/A;  [facility] COVID POSITIVE TEST - ATTACHED TO CHART   ESOPHAGOGASTRODUODENOSCOPY N/A 10/11/2021   Procedure: ESOPHAGOGASTRODUODENOSCOPY (EGD);  Surgeon: Marshall Skeeter, MD;  Location: [facility] ENDOSCOPY;  Service: Endoscopy;  Laterality: N/A;   ESOPHAGOGASTRODUODENOSCOPY (EGD) WITH PROPOFOL  N/A 08/19/2020   Procedure: ESOPHAGOGASTRODUODENOSCOPY (EGD) WITH PROPOFOL ;  Surgeon: Shane Darling, MD;  Location: [facility] ENDOSCOPY;  Service: Endoscopy;  Laterality: N/A;   FEMORAL HERNIA REPAIR Right 01/15/2017   Procedure: HERNIA REPAIR FEMORAL;  Surgeon: Benancio Bracket, MD;  Location: [facility] ORS;  Service: General;  Laterality: Right;   HERNIA REPAIR Right    ROTATOR CUFF REPAIR Right    TONSILLECTOMY     VAGINAL HYSTERECTOMY     1960's    OB History     Gravida  3   Para      Term      Preterm      AB      Living  2      SAB      IAB      Ectopic      Multiple      Live Births               Home Medications    Prior to Admission medications   Medication Sig Start Date  End Date Taking? Authorizing Provider  doxycycline  (VIBRAMYCIN ) 100 MG capsule Take 1 capsule (100 mg total) by mouth 2 (two) times daily for 7 days. 11/11/23 11/18/23 Yes Kent Pear, NP  ondansetron  (ZOFRAN -ODT) 8 MG disintegrating tablet Take 1 tablet (8 mg total) by mouth every 8 (eight) hours as needed for nausea or vomiting. 11/11/23  Yes Kent Pear, NP  acetaminophen  (TYLENOL ) 500 MG tablet Take 1,000 mg by mouth 2 (two) times daily as needed for mild pain or headache.    [provider]  albuterol  (VENTOLIN  HFA) 108 (90 Base) MCG/ACT inhaler Inhale 2 puffs into the lungs every 4 (four) hours as needed. 11/11/23   Kent Pear, NP  Albuterol  Sulfate (PROAIR  RESPICLICK) 108 (90 Base) MCG/ACT AEPB Inhale 2 puffs into the lungs every 4 (four) hours as needed. Patient taking differently: Inhale 2 puffs  into the lungs every 4 (four) hours as needed (shortness of breath or wheezing). 09/27/20   Antonio Baumgarten, NP  aspirin  EC 81 MG tablet Take 81 mg by mouth daily.    [provider]  azithromycin  (ZITHROMAX  Z-PAK) 250 MG tablet Take 1 tablet (250 mg total) by mouth daily. Take 2 tablets on the first day and then 1 tablet daily thereafter for a total of 5 days of treatment. 11/11/23   Kent Pear, NP  benzonatate  (TESSALON ) 100 MG capsule Take 2 capsules (200 mg total) by mouth every 8 (eight) hours. 11/11/23   Kent Pear, NP  budesonide -formoterol  (SYMBICORT ) 80-4.5 MCG/ACT inhaler Inhale 2 puffs into the lungs in the morning and at bedtime. Use twice daily, and every 4 hours as needed for wheezing. 09/25/22 09/25/23  Vergia Glasgow, MD  calcium -vitamin D  (OSCAL WITH D) 500-5 MG-MCG tablet Take 1 tablet by mouth 2 (two) times daily. 06/27/21   Verla Glaze, MD  cetirizine  (ZYRTEC ) 5 MG tablet Take 5 mg by mouth daily.    [provider]  cholecalciferol  (VITAMIN D3) 25 MCG (1000 UNIT) tablet Take 1,000 Units by mouth daily.    [provider]  formoterol  (PERFOROMIST ) 20 MCG/2ML nebulizer solution Inhale into the lungs. Patient not taking: Reported on 12/14/2022 12/22/21 12/22/22  [provider]  gabapentin  (NEURONTIN ) 300 MG capsule Take 300 mg by mouth 2 (two) times daily.    [provider]  ipratropium (ATROVENT ) 0.06 % nasal spray Place 2 sprays into both nostrils 4 (four) times daily. 11/11/23   Kent Pear, NP  ipratropium-albuterol  (DUONEB) 0.5-2.5 (3) MG/3ML SOLN Take 3 mLs by nebulization 4 (four) times daily as needed for shortness of breath or wheezing.    [provider]  ketorolac (ACULAR) 0.5 % ophthalmic solution Place 1 drop into the right eye 4 (four) times daily. 02/07/23   [provider]  levocetirizine (XYZAL) 5 MG tablet Take by mouth. 04/17/22   [provider]  methocarbamol  (ROBAXIN ) 500 MG tablet Take 1  tablet (500 mg total) by mouth at bedtime as needed for muscle spasms. 05/31/23   Brimage, Vondra, DO  montelukast  (SINGULAIR ) 10 MG tablet Take 1 tablet (10 mg total) by mouth at bedtime. 09/27/20   Antonio Baumgarten, NP  moxifloxacin (VIGAMOX) 0.5 % ophthalmic solution Place 1 drop into the right eye 4 (four) times daily. 02/07/23   [provider]  omeprazole (PRILOSEC) 40 MG capsule SMARTSIG:1 Capsule(s) By Mouth Every Evening 12/17/22   [provider]  promethazine -dextromethorphan (PROMETHAZINE -DM) 6.25-15 MG/5ML syrup Take 5 mLs by mouth 4 (four) times daily as needed. 11/11/23  Kent Pear, NP  sertraline  (ZOLOFT ) 50 MG tablet Take 50 mg by mouth daily. 07/23/19   [provider]  vitamin B-12 (CYANOCOBALAMIN ) 1000 MCG tablet Take 1,000 mcg by mouth daily.    [provider]    Family History Family History  Problem Relation Age of Onset   COPD Father    Asthma Daughter    Asthma Grandchild    Cancer Daughter        breast   Breast cancer Daughter    Other Mother        MVA    Social History Social History   Tobacco Use   Smoking status: Former    Current packs/day: 0.00    Average packs/day: 0.5 packs/day for 30.0 years (15.0 ttl pk-yrs)    Types: Cigarettes    Start date: 02/25/1979    Quit date: 02/24/2009    Years since quitting: 14.7   Smokeless tobacco: Never  Vaping Use   Vaping status: Never Used  Substance Use Topics   Alcohol use: No   Drug use: No     Allergies   Doxycycline , Fluticasone -salmeterol, Levaquin  [levofloxacin ], Amoxicillin-pot clavulanate, Diazepam, and Escitalopram oxalate   Review of Systems Review of Systems  Constitutional:  Negative for fever.  HENT:  Positive for congestion, ear pain, rhinorrhea, sinus pressure and sore throat.   Respiratory:  Positive for cough, shortness of breath and wheezing.   Musculoskeletal:  Positive for arthralgias and myalgias.  Neurological:  Positive for headaches.      Physical Exam Triage Vital Signs ED Triage Vitals [11/11/23 1158]  Encounter Vitals Group     BP      Systolic BP Percentile      Diastolic BP Percentile      Pulse      Resp      Temp      Temp src      SpO2      Weight      Height      Head Circumference      Peak Flow      Pain Score 0     Pain Loc      Pain Education      Exclude from Growth Chart    No data found.  Updated Vital Signs BP 100/68 (BP Location: Left Arm)   Pulse 99   Temp 98.5 F (36.9 C) (Oral)   Resp 16   SpO2 95%   Visual Acuity Right Eye Distance:   Left Eye Distance:   Bilateral Distance:    Right Eye Near:   Left Eye Near:    Bilateral Near:     Physical Exam Vitals and nursing note reviewed.  Constitutional:      Appearance: Normal appearance. She is not ill-appearing.  HENT:     Head: Normocephalic and atraumatic.     Right Ear: Tympanic membrane, ear canal and external ear normal. There is no impacted cerumen.     Left Ear: Tympanic membrane, ear canal and external ear normal. There is no impacted cerumen.     Nose: Congestion and rhinorrhea present.     Comments: Nasal mucosa is edematous and erythematous with clear discharge in both eyes.    Mouth/Throat:     Mouth: Mucous membranes are moist.     Pharynx: Oropharynx is clear. No oropharyngeal exudate or posterior oropharyngeal erythema.  Cardiovascular:     Rate and Rhythm: Normal rate and regular rhythm.     Pulses: Normal pulses.  Heart sounds: Normal heart sounds. No murmur heard.    No friction rub. No gallop.  Pulmonary:     Effort: Pulmonary effort is normal.     Breath sounds: Normal breath sounds. No wheezing, rhonchi or rales.  Chest:     Chest wall: No tenderness.  Musculoskeletal:     Cervical back: Normal range of motion and neck supple. No tenderness.  Lymphadenopathy:     Cervical: No cervical adenopathy.  Skin:    General: Skin is warm and dry.     Capillary Refill: Capillary refill takes  less than 2 seconds.     Findings: No rash.  Neurological:     General: No focal deficit present.     Mental Status: She is alert and oriented to person, place, and time.      UC Treatments / Results  Labs (all labs ordered are listed, but only abnormal results are displayed) Labs Reviewed - No data to display  EKG   Radiology DG Chest 2 View Result Date: 11/11/2023 CLINICAL DATA:  Productive cough EXAM: CHEST - 2 VIEW COMPARISON:  None Available. FINDINGS: There is increased density on the lateral view most likely localized to the right middle lobe. No pleural effusion or pneumothorax. Heart mediastinum are not significantly changed. IMPRESSION: 1. Suspected right middle lobe airspace infiltrate worrisome for pneumonia given the provided clinical data. Consider follow-up radiograph to demonstrate resolution. Electronically Signed   By: Reagan Camera M.D.   On: 11/11/2023 12:33    Procedures Procedures (including critical care time)  Medications Ordered in UC Medications - No data to display  Initial Impression / Assessment and Plan / UC Course  I have reviewed the triage vital signs and the nursing notes.  Pertinent labs & imaging results that were available during my care of the patient were reviewed by me and considered in my medical decision making (see chart for details).   Patient is a nontoxic-appearing 83 year old female presenting for evaluation of 5 days with respiratory symptoms as outlined in the HPI above.  In the exam room she is able to speak in full sentences without dyspnea or tachypnea.  She is afebrile with an oral temp of 98.5.  Respiratory rate was 16 at triage with a room air oxygen saturation of 95%.  Cardiopulmonary exam reveals clear lung sounds in all fields.  The remainder the physical exam does reveal inflammation of her upper respiratory tract as evidenced by inflamed nasal mucosa with scant clear nasal discharge.  Oropharyngeal exam is benign.  No  cervical lymphadenopathy present on exam.  Differential diagnose include COVID, influenza, viral respiratory illness, pneumonia, COPD exacerbation.  Given that she has had symptoms for 5 days I will not perform any respiratory testing as she is outside the therapeutic window for antivirals.  I will obtain a chest x-ray to evaluate for any acute cardiopulmonary pathology.  Chest x-ray independently reviewed and evaluated by me.  Impression: There is a patchy haziness in the left hilar region as well as a linear infiltrate in the left lung base near the lingula.  Radiology overread is pending. Radiology impression states suspected right middle lobe airspace infiltrate worrisome for pneumonia.  I will discharge patient diagnosis of community-acquired pneumonia.  She has documented allergies to doxycycline , Levaquin , and Augmentin.  Doxy causes nausea only while Levaquin  causes abdominal pain, nausea, and diarrhea.  Augmentin causes nausea and vomiting as well.  For treatment of her community-acquired pneumonia I will place patient on doxycycline  100 mg  twice daily with food for 7 days and prescribe Zofran  that she can take concomitantly to help with the nausea.  Additionally, I will prescribe azithromycin  that she can take once daily.  Tessalon  Perles and Promethazine  DM cough syrup for cough and congestion.  Albuterol  inhaler, with a spacer, 1 to 2 puffs every 4-6 hours as needed for any shortness of breath or wheezing.  I will also refill her regimen nasal spray to help with the nasal congestion.  Return precautions reviewed.   Final Clinical Impressions(s) / UC Diagnoses   Final diagnoses:  Acute cough  Community acquired pneumonia of right middle lobe of lung     Discharge Instructions      Your chest x-ray shows that you have pneumonia in your right middle lobe.  Take the doxycycline , 100 mg twice daily with food, for 7 days for treatment of your pneumonia.  Take 1 Zofran  oral  disintegrating tablet 15 to 20 minutes before each doxycycline  dose to prevent nausea associated with that antibiotic.  Take the azithromycin  once daily for 5 days for treatment of your pneumonia.  Use the albuterol  inhaler, 1 to 2 puffs every 4-6 hours, as needed for any shortness of breath or wheezing.  Use the Atrovent  nasal spray, 2 squirts in each nostril every 6 hours, as needed for runny nose and postnasal drip.  Use the Tessalon  Perles every 8 hours during the day.  Take them with a small sip of water.  They may give you some numbness to the base of your tongue or a metallic taste in your mouth, this is normal.  Use the Promethazine  DM cough syrup at bedtime for cough and congestion.  It will make you drowsy so do not take it during the day.  Return for reevaluation or see your primary care provider for any new or worsening symptoms.    ED Prescriptions     Medication Sig Dispense Auth. Provider   albuterol  (VENTOLIN  HFA) 108 (90 Base) MCG/ACT inhaler Inhale 2 puffs into the lungs every 4 (four) hours as needed. 18 g Kent Pear, NP   benzonatate  (TESSALON ) 100 MG capsule Take 2 capsules (200 mg total) by mouth every 8 (eight) hours. 21 capsule Kent Pear, NP   azithromycin  (ZITHROMAX  Z-PAK) 250 MG tablet Take 1 tablet (250 mg total) by mouth daily. Take 2 tablets on the first day and then 1 tablet daily thereafter for a total of 5 days of treatment. 6 tablet Kent Pear, NP   ipratropium (ATROVENT ) 0.06 % nasal spray Place 2 sprays into both nostrils 4 (four) times daily. 15 mL Kent Pear, NP   promethazine -dextromethorphan (PROMETHAZINE -DM) 6.25-15 MG/5ML syrup Take 5 mLs by mouth 4 (four) times daily as needed. 118 mL Kent Pear, NP   doxycycline  (VIBRAMYCIN ) 100 MG capsule Take 1 capsule (100 mg total) by mouth 2 (two) times daily for 7 days. 14 capsule Kent Pear, NP   ondansetron  (ZOFRAN -ODT) 8 MG disintegrating tablet Take 1 tablet (8 mg total) by mouth every 8  (eight) hours as needed for nausea or vomiting. 20 tablet Kent Pear, NP      PDMP not reviewed this encounter.   Kent Pear, NP 11/11/23 1248

## 2023-11-11 NOTE — ED Triage Notes (Signed)
 Pt present cough with congestion, symptoms started last Wednesday.  Pt tried otc medication with no relief.

## 2023-12-12 ENCOUNTER — Other Ambulatory Visit: Payer: Self-pay | Admitting: Student in an Organized Health Care Education/Training Program

## 2023-12-12 DIAGNOSIS — J4489 Other specified chronic obstructive pulmonary disease: Secondary | ICD-10-CM

## 2023-12-12 MED ORDER — SYMBICORT 80-4.5 MCG/ACT IN AERO: 2.0000 | INHALATION_SPRAY | Freq: Two times a day (BID) | RESPIRATORY_TRACT | 11 refills | Status: AC

## 2023-12-12 NOTE — Addendum Note (Signed)
 Addended by: Siobahn Worsley on: 12/12/2023 04:53 PM   Modules accepted: Orders

## 2023-12-25 ENCOUNTER — Ambulatory Visit
Admission: EM | Admit: 2023-12-25 | Discharge: 2023-12-25 | Disposition: A | Discharge status: Home / Self Care | Attending: Family Medicine | Admitting: Family Medicine

## 2023-12-25 DIAGNOSIS — L241 Irritant contact dermatitis due to oils and greases: Secondary | ICD-10-CM

## 2023-12-25 MED ORDER — TRIAMCINOLONE ACETONIDE 0.1 % EX OINT: 1.0000 | TOPICAL_OINTMENT | Freq: Two times a day (BID) | CUTANEOUS | 0 refills | Status: DC

## 2023-12-25 MED ORDER — PREDNISONE 10 MG (21) PO TBPK
ORAL_TABLET | Freq: Every day | ORAL | 0 refills | Status: DC
Start: 2023-12-25 — End: 2024-03-01

## 2023-12-25 NOTE — ED Provider Notes (Signed)
 [facility]    CSN: [id number] Arrival date & time: 12/25/23  1159      History   Chief Complaint Chief Complaint  Patient presents with   Rash    HPI Sandy Eaton is a 83 y.o. female.   HPI  Sandy Eaton presents for red and itchy rash after cleaning up the [place]. Has been applying OTC treatments without relief. Her family member has a similar rash and was also cleaning up outside.   There is been no new products including soaps and detergents.  No eye irritation, sore throat, difficulty breathing, nausea, vomiting or diarrhea.  Denies belly pain, new joint pain and fever.  There has been no medication changes or new supplements.  Denies any new foods or drinks.      Past Medical History:  Diagnosis Date   Arthritis    Asthma    Chronic asthmatic bronchitis (HCC)    COPD (chronic obstructive pulmonary disease) (HCC)    Suspected   Depression    Dyspnea    on exertion   Environmental allergies    GERD (gastroesophageal reflux disease)    Glaucoma    Headache    migraine without aura, not intractable   Hyperlipidemia    Osteopenia    of lumbar spine   Pneumonia    3 times    Patient Active Problem List   Diagnosis Date Noted   Chronic rhinitis 07/28/2021   Multiple closed stable fractures of pubic ramus (HCC) 06/26/2021   Multiple closed pelvic fractures without disruption of pelvic circle (HCC)    Nausea    COVID-19 virus infection    Hypoxia    Neuropathy    Pubic ramus fracture (HCC) 06/25/2021   GERD (gastroesophageal reflux disease) 06/25/2021   COPD (chronic obstructive pulmonary disease) (HCC) 01/02/2019   Pneumonia 05/05/2015   Hospital acquired PNA 11/26/2014   DOE (dyspnea on exertion) 02/24/2014   Cough 02/24/2014    Past Surgical History:  Procedure Laterality Date   AMPUTATION TOE     APPENDECTOMY     COLONOSCOPY WITH PROPOFOL  N/A 08/19/2020   Procedure: COLONOSCOPY WITH PROPOFOL ;  Surgeon: Maryruth Ole DASEN, MD;   Location: [facility] ENDOSCOPY;  Service: Endoscopy;  Laterality: N/A;  [facility] COVID POSITIVE TEST - ATTACHED TO CHART   ESOPHAGOGASTRODUODENOSCOPY N/A 10/11/2021   Procedure: ESOPHAGOGASTRODUODENOSCOPY (EGD);  Surgeon: Dessa Reyes ORN, MD;  Location: [facility] ENDOSCOPY;  Service: Endoscopy;  Laterality: N/A;   ESOPHAGOGASTRODUODENOSCOPY (EGD) WITH PROPOFOL  N/A 08/19/2020   Procedure: ESOPHAGOGASTRODUODENOSCOPY (EGD) WITH PROPOFOL ;  Surgeon: Maryruth Ole DASEN, MD;  Location: [facility] ENDOSCOPY;  Service: Endoscopy;  Laterality: N/A;   FEMORAL HERNIA REPAIR Right 01/15/2017   Procedure: HERNIA REPAIR FEMORAL;  Surgeon: Claudene Larinda Bolder, MD;  Location: [facility] ORS;  Service: General;  Laterality: Right;   HERNIA REPAIR Right    ROTATOR CUFF REPAIR Right    TONSILLECTOMY     VAGINAL HYSTERECTOMY     1960's    OB History     Gravida  3   Para      Term      Preterm      AB      Living  2      SAB      IAB      Ectopic      Multiple      Live Births               Home Medications    Prior to Admission  medications   Medication Sig Start Date End Date Taking? Authorizing Provider  predniSONE  (STERAPRED UNI-PAK 21 TAB) 10 MG (21) TBPK tablet Take by mouth daily. Take 6 tabs by mouth daily for 1, then 5 tabs for 1 day, then 4 tabs for 1 day, then 3 tabs for 1 day, then 2 tabs for 1 day, then 1 tab for 1 day. 12/25/23  Yes Virgal Warmuth, DO  triamcinolone  ointment (KENALOG ) 0.1 % Apply 1 Application topically 2 (two) times daily. 12/25/23  Yes Terald Jump, DO  acetaminophen  (TYLENOL ) 500 MG tablet Take 1,000 mg by mouth 2 (two) times daily as needed for mild pain or headache.    [provider]  albuterol  (VENTOLIN  HFA) 108 (90 Base) MCG/ACT inhaler Inhale 2 puffs into the lungs every 4 (four) hours as needed. 11/11/23   Bernardino Ditch, NP  Albuterol  Sulfate (PROAIR  RESPICLICK) 108 (90 Base) MCG/ACT AEPB Inhale 2 puffs into the lungs every 4 (four) hours as needed. Patient  taking differently: Inhale 2 puffs into the lungs every 4 (four) hours as needed (shortness of breath or wheezing). 09/27/20   Hope Almarie ORN, NP  aspirin  EC 81 MG tablet Take 81 mg by mouth daily.    [provider]  azithromycin  (ZITHROMAX  Z-PAK) 250 MG tablet Take 1 tablet (250 mg total) by mouth daily. Take 2 tablets on the first day and then 1 tablet daily thereafter for a total of 5 days of treatment. 11/11/23   Bernardino Ditch, NP  benzonatate  (TESSALON ) 100 MG capsule Take 2 capsules (200 mg total) by mouth every 8 (eight) hours. 11/11/23   Bernardino Ditch, NP  calcium -vitamin D  (OSCAL WITH D) 500-5 MG-MCG tablet Take 1 tablet by mouth 2 (two) times daily. 06/27/21   Josette Ade, MD  cetirizine  (ZYRTEC ) 5 MG tablet Take 5 mg by mouth daily.    [provider]  cholecalciferol  (VITAMIN D3) 25 MCG (1000 UNIT) tablet Take 1,000 Units by mouth daily.    [provider]  formoterol  (PERFOROMIST ) 20 MCG/2ML nebulizer solution Inhale into the lungs. Patient not taking: Reported on 12/14/2022 12/22/21 12/22/22  [provider]  gabapentin  (NEURONTIN ) 300 MG capsule Take 300 mg by mouth 2 (two) times daily.    [provider]  ipratropium (ATROVENT ) 0.06 % nasal spray Place 2 sprays into both nostrils 4 (four) times daily. 11/11/23   Bernardino Ditch, NP  ipratropium-albuterol  (DUONEB) 0.5-2.5 (3) MG/3ML SOLN Take 3 mLs by nebulization 4 (four) times daily as needed for shortness of breath or wheezing.    [provider]  ketorolac (ACULAR) 0.5 % ophthalmic solution Place 1 drop into the right eye 4 (four) times daily. 02/07/23   [provider]  levocetirizine (XYZAL) 5 MG tablet Take by mouth. 04/17/22   [provider]  methocarbamol  (ROBAXIN ) 500 MG tablet Take 1 tablet (500 mg total) by mouth at bedtime as needed for muscle spasms. 05/31/23   Danford Tat, DO  montelukast  (SINGULAIR ) 10 MG tablet Take 1 tablet (10 mg total) by mouth at  bedtime. 09/27/20   Hope Almarie ORN, NP  moxifloxacin (VIGAMOX) 0.5 % ophthalmic solution Place 1 drop into the right eye 4 (four) times daily. 02/07/23   [provider]  omeprazole (PRILOSEC) 40 MG capsule SMARTSIG:1 Capsule(s) By Mouth Every Evening 12/17/22   [provider]  ondansetron  (ZOFRAN -ODT) 8 MG disintegrating tablet Take 1 tablet (8 mg total) by mouth every 8 (eight) hours as needed for nausea or vomiting. 11/11/23  Bernardino Ditch, NP  promethazine -dextromethorphan (PROMETHAZINE -DM) 6.25-15 MG/5ML syrup Take 5 mLs by mouth 4 (four) times daily as needed. 11/11/23   Bernardino Ditch, NP  sertraline  (ZOLOFT ) 50 MG tablet Take 50 mg by mouth daily. 07/23/19   [provider]  SYMBICORT  80-4.5 MCG/ACT inhaler Inhale 2 puffs into the lungs 2 (two) times daily. 12/12/23   Isadora Hose, MD  vitamin B-12 (CYANOCOBALAMIN ) 1000 MCG tablet Take 1,000 mcg by mouth daily.    [provider]    Family History Family History  Problem Relation Age of Onset   COPD Father    Asthma Daughter    Asthma Grandchild    Cancer Daughter        breast   Breast cancer Daughter    Other Mother        MVA    Social History Social History   Tobacco Use   Smoking status: Former    Current packs/day: 0.00    Average packs/day: 0.5 packs/day for 30.0 years (15.0 ttl pk-yrs)    Types: Cigarettes    Start date: 02/25/1979    Quit date: 02/24/2009    Years since quitting: 14.8   Smokeless tobacco: Never  Vaping Use   Vaping status: Never Used  Substance Use Topics   Alcohol use: No   Drug use: No     Allergies   Doxycycline , Fluticasone -salmeterol, Levaquin  [levofloxacin ], Amoxicillin-pot clavulanate, Diazepam, and Escitalopram oxalate   Review of Systems Review of Systems :negative unless otherwise stated in HPI.      Physical Exam Triage Vital Signs ED Triage Vitals  Encounter Vitals Group     BP 12/25/23 1301 136/82     Girls Systolic BP Percentile --       Girls Diastolic BP Percentile --      Boys Systolic BP Percentile --      Boys Diastolic BP Percentile --      Pulse Rate 12/25/23 1301 70     Resp 12/25/23 1301 16     Temp 12/25/23 1301 97.8 F (36.6 C)     Temp Source 12/25/23 1301 Oral     SpO2 12/25/23 1301 95 %     Weight --      Height --      Head Circumference --      Peak Flow --      Pain Score 12/25/23 1300 0     Pain Loc --      Pain Education --      Exclude from Growth Chart --    No data found.  Updated Vital Signs BP 136/82 (BP Location: Left Arm)   Pulse 70   Temp 97.8 F (36.6 C) (Oral)   Resp 16   SpO2 95%   Visual Acuity Right Eye Distance:   Left Eye Distance:   Bilateral Distance:    Right Eye Near:   Left Eye Near:    Bilateral Near:     Physical Exam  GEN: alert, well appearing female, in no acute distress  EYES: no scleral injection, right upper lid is erythematous without discharge or edema CV: regular rate RESP: no increased work of breath NEURO: alert, moves all extremities appropriately SKIN: warm and dry; erythematous patches and papules on BUE, BLE and especially on hands where there are blisters and crusting    UC Treatments / Results  Labs (all labs ordered are listed, but only abnormal results are displayed) Labs Reviewed - No data to display  EKG  Radiology No results found.  Procedures Procedures (including critical care time)  Medications Ordered in UC Medications - No data to display  Initial Impression / Assessment and Plan / UC Course  I have reviewed the triage vital signs and the nursing notes.  Pertinent labs & imaging results that were available during my care of the patient were reviewed by me and considered in my medical decision making (see chart for details).      Contact Dermatitis Patient is a 82 y.o. female who presents for worsening rash for the week.  Overall, patient is well-appearing and well-hydrated.  Vital signs stable.  Sandy June  Eaton is afebrile.  History and exam concerning for contact dermatitis.  Decadron  10 mg IM offered but was declined.  Treat with prednisone  taper and steroid ointment.  Claritin  or Zyrtec  twice a day for additional itch relief. No sign of infection to suggest antifungals or antibiotics at this time.    Reviewed expectations regarding course of current medical issues.  All questions asked were answered.  Outlined signs and symptoms indicating need for more acute intervention. Patient verbalized understanding. After Visit Summary given.   Final Clinical Impressions(s) / UC Diagnoses   Final diagnoses:  Irritant contact dermatitis due to oils     Discharge Instructions      Stop by the pharmacy to pick up your prescriptions.  Follow up with your primary care provider or return to the urgent care, if not improving.       ED Prescriptions     Medication Sig Dispense Auth. Provider   triamcinolone  ointment (KENALOG ) 0.1 % Apply 1 Application topically 2 (two) times daily. 30 g Emmamarie Kluender, DO   predniSONE  (STERAPRED UNI-PAK 21 TAB) 10 MG (21) TBPK tablet Take by mouth daily. Take 6 tabs by mouth daily for 1, then 5 tabs for 1 day, then 4 tabs for 1 day, then 3 tabs for 1 day, then 2 tabs for 1 day, then 1 tab for 1 day. 21 tablet Lisa Milian, DO      PDMP not reviewed this encounter.              Porfirio Bollier, DO 12/26/23 1844

## 2023-12-25 NOTE — ED Triage Notes (Signed)
 Pt presents with a rash on her face, hands and legs x 1 week.

## 2023-12-25 NOTE — Discharge Instructions (Signed)
 Stop by the pharmacy to pick up your prescriptions.  Follow up with your primary care provider or return to the urgent care, if not improving.

## 2024-01-21 NOTE — Telephone Encounter (Signed)
 See prior phone note.

## 2024-01-24 ENCOUNTER — Other Ambulatory Visit: Payer: Self-pay | Admitting: Student in an Organized Health Care Education/Training Program

## 2024-01-24 DIAGNOSIS — J4489 Other specified chronic obstructive pulmonary disease: Secondary | ICD-10-CM

## 2024-01-31 ENCOUNTER — Ambulatory Visit: Admitting: Nurse Practitioner

## 2024-02-28 ENCOUNTER — Ambulatory Visit: Admitting: Nurse Practitioner

## 2024-02-28 ENCOUNTER — Encounter: Payer: Self-pay | Admitting: Nurse Practitioner

## 2024-02-28 VITALS — BP 112/72 | HR 86 | Temp 97.6°F | Ht 64.0 in | Wt 148.2 lb

## 2024-02-28 DIAGNOSIS — F419 Anxiety disorder, unspecified: Secondary | ICD-10-CM | POA: Diagnosis not present

## 2024-02-28 DIAGNOSIS — Z23 Encounter for immunization: Secondary | ICD-10-CM | POA: Diagnosis not present

## 2024-02-28 DIAGNOSIS — E785 Hyperlipidemia, unspecified: Secondary | ICD-10-CM | POA: Diagnosis not present

## 2024-02-28 DIAGNOSIS — G2581 Restless legs syndrome: Secondary | ICD-10-CM | POA: Diagnosis not present

## 2024-02-28 DIAGNOSIS — G629 Polyneuropathy, unspecified: Secondary | ICD-10-CM

## 2024-02-28 DIAGNOSIS — J4489 Other specified chronic obstructive pulmonary disease: Secondary | ICD-10-CM

## 2024-02-28 DIAGNOSIS — F32A Depression, unspecified: Secondary | ICD-10-CM

## 2024-02-28 DIAGNOSIS — J302 Other seasonal allergic rhinitis: Secondary | ICD-10-CM | POA: Insufficient documentation

## 2024-02-28 DIAGNOSIS — K219 Gastro-esophageal reflux disease without esophagitis: Secondary | ICD-10-CM | POA: Diagnosis not present

## 2024-02-28 LAB — CBC WITH DIFFERENTIAL/PLATELET
Basophils Absolute: 0.1 K/uL (ref 0.0–0.1)
Basophils Relative: 1.1 % (ref 0.0–3.0)
Eosinophils Absolute: 1.2 K/uL — ABNORMAL HIGH (ref 0.0–0.7)
Eosinophils Relative: 15.1 % — ABNORMAL HIGH (ref 0.0–5.0)
HCT: 41.3 % (ref 36.0–46.0)
Hemoglobin: 13.7 g/dL (ref 12.0–15.0)
Lymphocytes Relative: 28.5 % (ref 12.0–46.0)
Lymphs Abs: 2.2 K/uL (ref 0.7–4.0)
MCHC: 33.2 g/dL (ref 30.0–36.0)
MCV: 93.6 fl (ref 78.0–100.0)
Monocytes Absolute: 0.6 K/uL (ref 0.1–1.0)
Monocytes Relative: 7.4 % (ref 3.0–12.0)
Neutro Abs: 3.7 K/uL (ref 1.4–7.7)
Neutrophils Relative %: 47.9 % (ref 43.0–77.0)
Platelets: 192 K/uL (ref 150.0–400.0)
RBC: 4.41 Mil/uL (ref 3.87–5.11)
RDW: 12.6 % (ref 11.5–15.5)
WBC: 7.7 K/uL (ref 4.0–10.5)

## 2024-02-28 LAB — LIPID PANEL
Cholesterol: 280 mg/dL — ABNORMAL HIGH (ref 0–200)
HDL: 45.8 mg/dL (ref >39.00)
LDL Cholesterol: 177 mg/dL — ABNORMAL HIGH (ref 0–99)
NonHDL: 234.51
Total CHOL/HDL Ratio: 6
Triglycerides: 287 mg/dL — ABNORMAL HIGH (ref 0.0–149.0)
VLDL: 57.4 mg/dL — ABNORMAL HIGH (ref 0.0–40.0)

## 2024-02-28 LAB — COMPREHENSIVE METABOLIC PANEL WITH GFR
ALT: 14 U/L (ref 0–35)
AST: 17 U/L (ref 0–37)
Albumin: 4.3 g/dL (ref 3.5–5.2)
Alkaline Phosphatase: 89 U/L (ref 39–117)
BUN: 8 mg/dL (ref 6–23)
CO2: 29 meq/L (ref 19–32)
Calcium: 9.3 mg/dL (ref 8.4–10.5)
Chloride: 103 meq/L (ref 96–112)
Creatinine, Ser: 0.6 mg/dL (ref 0.40–1.20)
GFR: 82.88 mL/min (ref >60.00)
Glucose, Bld: 116 mg/dL — ABNORMAL HIGH (ref 70–99)
Potassium: 3.9 meq/L (ref 3.5–5.1)
Sodium: 142 meq/L (ref 135–145)
Total Bilirubin: 0.5 mg/dL (ref 0.2–1.2)
Total Protein: 6.4 g/dL (ref 6.0–8.3)

## 2024-02-28 LAB — MAGNESIUM: Magnesium: 1.9 mg/dL (ref 1.5–2.5)

## 2024-02-28 LAB — TSH: TSH: 1.29 u[IU]/mL (ref 0.35–5.50)

## 2024-02-28 NOTE — Progress Notes (Signed)
 Established Patient Office Visit  Subjective:  Patient ID: Sandy Eaton, female    DOB: [birth year]  Age: 83 y.o. MRN: [id number]  CC:  Chief Complaint  Patient presents with   Establish Care   Discussed the use of a AI scribe software for clinical note transcription with the patient, who gave verbal consent to proceed.  HPI  Riti June Kuchera presents to establish care accompanied with her daughter.   Restless leg: restless legs in the evening. She is taking gabapentin  300 mg twice daily.   Anxiety and depression: She is on Zoloft  for mood disorder.   GERD: She is taking omeprazole for acid reflux  Former Smoker: She has h/o smoking around 15 pack year.  Quit in 2010  Chronic asthmatic bronchitis: She is followed by pulmonology Dr. Isadora. On Symbicort  for SMART Therapy.   She lives with her daughter and two grandchildren. She has a history of pneumonia and breathing complications, leading to disability status after COVID-19 complications. No significant issues with sleep, appetite, or concentration.  HPI   Past Medical History:  Diagnosis Date   Allergy    Arthritis    Asthma    Chronic asthmatic bronchitis (HCC)    COPD (chronic obstructive pulmonary disease) (HCC)    Suspected   Depression    Dyspnea    on exertion   Environmental allergies    GERD (gastroesophageal reflux disease)    Glaucoma    Headache    migraine without aura, not intractable   Hyperlipidemia    Osteopenia    of lumbar spine   Pneumonia    3 times    Past Surgical History:  Procedure Laterality Date   AMPUTATION TOE     APPENDECTOMY     COLONOSCOPY WITH PROPOFOL  N/A 08/19/2020   Procedure: COLONOSCOPY WITH PROPOFOL ;  Surgeon: Maryruth Ole DASEN, MD;  Location: [facility] ENDOSCOPY;  Service: Endoscopy;  Laterality: N/A;  [facility] COVID POSITIVE TEST - ATTACHED TO CHART   ESOPHAGOGASTRODUODENOSCOPY N/A 10/11/2021   Procedure: ESOPHAGOGASTRODUODENOSCOPY (EGD);  Surgeon: Dessa Reyes ORN, MD;  Location: [facility] ENDOSCOPY;  Service: Endoscopy;  Laterality: N/A;   ESOPHAGOGASTRODUODENOSCOPY (EGD) WITH PROPOFOL  N/A 08/19/2020   Procedure: ESOPHAGOGASTRODUODENOSCOPY (EGD) WITH PROPOFOL ;  Surgeon: Maryruth Ole DASEN, MD;  Location: [facility] ENDOSCOPY;  Service: Endoscopy;  Laterality: N/A;   FEMORAL HERNIA REPAIR Right 01/15/2017   Procedure: HERNIA REPAIR FEMORAL;  Surgeon: Claudene Larinda Bolder, MD;  Location: [facility] ORS;  Service: General;  Laterality: Right;   HERNIA REPAIR Right    ROTATOR CUFF REPAIR Right    TONSILLECTOMY     VAGINAL HYSTERECTOMY     1960's    Family History  Problem Relation Age of Onset   Arthritis Mother    Other Mother        MVA   Hearing loss Father    Arthritis Father    COPD Father    Hypertension Sister    Hyperlipidemia Sister    Hearing loss Sister    Diabetes Sister    Hypertension Brother    Hearing loss Brother    Arthritis Brother    Hearing loss Brother    Arthritis Brother    Kidney disease Daughter    Asthma Daughter    Cancer Daughter        breast   Breast cancer Daughter    Asthma Grandchild     Social History   Socioeconomic History   Marital status: Single    Spouse name: Not on  file   Number of children: Not on file   Years of education: Not on file   Highest education level: Not on file  Occupational History   Not on file  Tobacco Use   Smoking status: Former    Current packs/day: 0.00    Average packs/day: 0.5 packs/day for 30.0 years (15.0 ttl pk-yrs)    Types: Cigarettes    Start date: 02/25/1979    Quit date: 02/24/2009    Years since quitting: 15.0   Smokeless tobacco: Never  Vaping Use   Vaping status: Never Used  Substance and Sexual Activity   Alcohol use: No   Drug use: No   Sexual activity: Not on file  Other Topics Concern   Not on file  Social History Narrative   Not on file   Social Drivers of Health   Financial Resource Strain: High Risk (10/11/2022)   Received from [facility]   Overall Financial Resource Strain (CARDIA)    Difficulty of Paying Living Expenses: Hard  Food Insecurity: Food Insecurity Present (10/11/2022)   Received from [facility]   Hunger Vital Sign    Within the past 12 months, you worried that your food would run out before you got the money to buy more.: Often true    Within the past 12 months, the food you bought just didn't last and you didn't have money to get more.: Sometimes true  Transportation Needs: No Transportation Needs (10/11/2022)   Received from [facility] - Transportation    In the past 12 months, has lack of transportation kept you from medical appointments or from getting medications?: No    Lack of Transportation (Non-Medical): No  Physical Activity: Not on file  Stress: Not on file  Social Connections: Not on file  Intimate Partner Violence: Not on file     Outpatient Medications Prior to Visit  Medication Sig Dispense Refill   albuterol  (VENTOLIN  HFA) 108 (90 Base) MCG/ACT inhaler Inhale 2 puffs into the lungs every 4 (four) hours as needed. 18 g 0   aspirin  EC 81 MG tablet Take 81 mg by mouth daily.     cetirizine  (ZYRTEC ) 5 MG tablet Take 5 mg by mouth daily.     cholecalciferol  (VITAMIN D3) 25 MCG (1000 UNIT) tablet Take 1,000 Units by mouth daily.     gabapentin  (NEURONTIN ) 300 MG capsule Take 300 mg by mouth 2 (two) times daily.     ipratropium (ATROVENT ) 0.06 % nasal spray Place 2 sprays into both nostrils 4 (four) times daily. 15 mL 12   ipratropium-albuterol  (DUONEB) 0.5-2.5 (3) MG/3ML SOLN Take 3 mLs by nebulization 4 (four) times daily as needed for shortness of breath or wheezing.     montelukast  (SINGULAIR ) 10 MG tablet Take 1 tablet (10 mg total) by mouth at bedtime. 30 tablet 5   moxifloxacin (VIGAMOX) 0.5 % ophthalmic solution Place 1 drop into the right eye 4 (four) times daily.     omeprazole (PRILOSEC) 40 MG capsule SMARTSIG:1 Capsule(s) By  Mouth Every Evening     sertraline  (ZOLOFT ) 50 MG tablet Take 50 mg by mouth daily.     SYMBICORT  80-4.5 MCG/ACT inhaler Inhale 2 puffs into the lungs 2 (two) times daily. 7 g 11   vitamin B-12 (CYANOCOBALAMIN ) 1000 MCG tablet Take 1,000 mcg by mouth daily.     ondansetron  (ZOFRAN -ODT) 8 MG disintegrating tablet Take 1 tablet (8 mg total) by mouth every 8 (  eight) hours as needed for nausea or vomiting. 20 tablet 0   acetaminophen  (TYLENOL ) 500 MG tablet Take 1,000 mg by mouth 2 (two) times daily as needed for mild pain or headache. (Patient not taking: No sig reported)     Albuterol  Sulfate (PROAIR  RESPICLICK) 108 (90 Base) MCG/ACT AEPB Inhale 2 puffs into the lungs every 4 (four) hours as needed. (Patient not taking: No sig reported) 1 each 3   formoterol  (PERFOROMIST ) 20 MCG/2ML nebulizer solution Inhale into the lungs. (Patient not taking: Reported on 12/14/2022)     azithromycin  (ZITHROMAX  Z-PAK) 250 MG tablet Take 1 tablet (250 mg total) by mouth daily. Take 2 tablets on the first day and then 1 tablet daily thereafter for a total of 5 days of treatment. (Patient not taking: No sig reported) 6 tablet 0   benzonatate  (TESSALON ) 100 MG capsule Take 2 capsules (200 mg total) by mouth every 8 (eight) hours. (Patient not taking: No sig reported) 21 capsule 0   calcium -vitamin D  (OSCAL WITH D) 500-5 MG-MCG tablet Take 1 tablet by mouth 2 (two) times daily. (Patient not taking: No sig reported) 60 tablet 0   ketorolac (ACULAR) 0.5 % ophthalmic solution Place 1 drop into the right eye 4 (four) times daily. (Patient not taking: No sig reported)     levocetirizine (XYZAL) 5 MG tablet Take by mouth. (Patient not taking: No sig reported)     methocarbamol  (ROBAXIN ) 500 MG tablet Take 1 tablet (500 mg total) by mouth at bedtime as needed for muscle spasms. (Patient not taking: No sig reported) 20 tablet 0   predniSONE  (STERAPRED UNI-PAK 21 TAB) 10 MG (21) TBPK tablet Take by mouth daily. Take 6 tabs by mouth  daily for 1, then 5 tabs for 1 day, then 4 tabs for 1 day, then 3 tabs for 1 day, then 2 tabs for 1 day, then 1 tab for 1 day. (Patient not taking: No sig reported) 21 tablet 0   promethazine -dextromethorphan (PROMETHAZINE -DM) 6.25-15 MG/5ML syrup Take 5 mLs by mouth 4 (four) times daily as needed. (Patient not taking: No sig reported) 118 mL 0   triamcinolone  ointment (KENALOG ) 0.1 % Apply 1 Application topically 2 (two) times daily. (Patient not taking: No sig reported) 30 g 0   No facility-administered medications prior to visit.    Allergies  Allergen Reactions   Doxycycline  Nausea Only   Fluticasone -Salmeterol Other (See Comments)    Thrush   Levaquin  [Levofloxacin ] Diarrhea    Abdominal pain and diarrhea with nausea but no vomiting    Amoxicillin -Pot Clavulanate Nausea And Vomiting and Nausea Only    Has patient had a PCN reaction causing immediate rash, facial/tongue/throat swelling, SOB or lightheadedness with hypotension: No  Has patient had a PCN reaction causing severe rash involving mucus membranes or skin necrosis: No  Has patient had a PCN reaction that required hospitalization: No  Has patient had a PCN reaction occurring within the last 10 years: No  If all of the above answers are NO, then may proceed with Cephalosporin use.   Diazepam Other (See Comments)    Reaction:  Hallucinations   Escitalopram Oxalate Diarrhea    ROS Review of Systems Negative unless indicated in HPI.    Objective:    Physical Exam  BP 112/72   Pulse 86   Temp 97.6 F (36.4 C)   Ht 5' 4 (1.626 m)   Wt 148 lb 3.2 oz (67.2 kg)   SpO2 96%   BMI 25.44 kg/m  Wt Readings from Last 3 Encounters:  03/01/24 148 lb 4.8 oz (67.3 kg)  02/28/24 148 lb 3.2 oz (67.2 kg)  05/31/23 145 lb 1 oz (65.8 kg)     Health Maintenance  Topic Date Due   DEXA SCAN  Never done   COVID-19 Vaccine (7 - 2025-26 season) 02/17/2024   Medicare Annual Wellness (AWV)  12/08/2024   DTaP/Tdap/Td (3 - Td  or Tdap) 10/31/2032   Pneumococcal Vaccine: 50+ Years  Completed   Influenza Vaccine  Completed   Zoster Vaccines- Shingrix  Completed   HPV VACCINES  Aged Out   Meningococcal B Vaccine  Aged Out    There are no preventive care reminders to display for this patient.  Lab Results  Component Value Date   TSH 1.29 02/28/2024   Lab Results  Component Value Date   WBC 7.7 02/28/2024   HGB 13.7 02/28/2024   HCT 41.3 02/28/2024   MCV 93.6 02/28/2024   PLT 192.0 02/28/2024   Lab Results  Component Value Date   NA 142 02/28/2024   K 3.9 02/28/2024   CO2 29 02/28/2024   GLUCOSE 116 (H) 02/28/2024   BUN 8 02/28/2024   CREATININE 0.60 02/28/2024   BILITOT 0.5 02/28/2024   ALKPHOS 89 02/28/2024   AST 17 02/28/2024   ALT 14 02/28/2024   PROT 6.4 02/28/2024   ALBUMIN 4.3 02/28/2024   CALCIUM  9.3 02/28/2024   ANIONGAP 7 06/26/2021   GFR 82.88 02/28/2024   Lab Results  Component Value Date   CHOL 280 (H) 02/28/2024   Lab Results  Component Value Date   HDL 45.80 02/28/2024   Lab Results  Component Value Date   LDLCALC 177 (H) 02/28/2024   Lab Results  Component Value Date   TRIG 287.0 (H) 02/28/2024   Lab Results  Component Value Date   CHOLHDL 6 02/28/2024   No results found for: HGBA1C    Assessment & Plan:  Hyperlipidemia, unspecified hyperlipidemia type -     CBC with Differential/Platelet -     Comprehensive metabolic panel with GFR -     Lipid panel  Restless legs Assessment & Plan: Cramps, and restless legs at night, worsened by prolonged standing. -Continue gabapentin  as needed.   Orders: -     CBC with Differential/Platelet -     TSH -     Magnesium   Need for immunization against influenza -     Flu vaccine HIGH DOSE PF(Fluzone Trivalent)  Gastroesophageal reflux disease, unspecified whether esophagitis present Assessment & Plan: GERD managed with daily omeprazole, symptoms exacerbated by certain foods.   Anxiety and  depression Assessment & Plan:    02/28/2024    4:09 PM  GAD 7 : Generalized Anxiety Score  Nervous, Anxious, on Edge 1  Control/stop worrying 1  Worry too much - different things 2  Trouble relaxing 0  Restless 1  Easily annoyed or irritable 1  Afraid - awful might happen 0  Total GAD 7 Score 6  Anxiety Difficulty Not difficult at all       02/28/2024    4:09 PM  Depression screen PHQ 2/9  Decreased Interest 0  Down, Depressed, Hopeless 1  PHQ - 2 Score 1  Altered sleeping 0  Tired, decreased energy 0  Change in appetite 0  Feeling bad or failure about yourself  0  Trouble concentrating 0  Moving slowly or fidgety/restless 1  Suicidal thoughts 0  PHQ-9 Score 2  Difficult doing work/chores Not difficult  at all  -Chronic stable. -Continue Zoloft  50 mg dail, occasional low mood or anxiety but generally positive outlook.    Chronic asthmatic bronchitis (HCC) Assessment & Plan: She is followed by pulmonology. -Continue Symbicort  as SMART therapy.       Follow-up: Return in about 4 months (around 06/29/2024).   Bettyanne Dittman, NP

## 2024-03-01 ENCOUNTER — Ambulatory Visit
Admission: EM | Admit: 2024-03-01 | Discharge: 2024-03-01 | Disposition: A | Discharge status: Home / Self Care | Attending: Emergency Medicine | Admitting: Emergency Medicine

## 2024-03-01 ENCOUNTER — Ambulatory Visit (INDEPENDENT_AMBULATORY_CARE_PROVIDER_SITE_OTHER)

## 2024-03-01 ENCOUNTER — Ambulatory Visit: Payer: Self-pay | Admitting: Emergency Medicine

## 2024-03-01 DIAGNOSIS — J4541 Moderate persistent asthma with (acute) exacerbation: Secondary | ICD-10-CM

## 2024-03-01 DIAGNOSIS — R052 Subacute cough: Secondary | ICD-10-CM

## 2024-03-01 DIAGNOSIS — J0100 Acute maxillary sinusitis, unspecified: Secondary | ICD-10-CM

## 2024-03-01 MED ORDER — ONDANSETRON 4 MG PO TBDP: 4.0000 mg | ORAL_TABLET | Freq: Three times a day (TID) | ORAL | 0 refills | Status: AC | PRN

## 2024-03-01 MED ORDER — AEROCHAMBER MV MISC: 1 refills | Status: AC

## 2024-03-01 MED ORDER — AMOXICILLIN-POT CLAVULANATE 875-125 MG PO TABS: 1.0000 | ORAL_TABLET | Freq: Two times a day (BID) | ORAL | 0 refills | Status: DC

## 2024-03-01 MED ORDER — ALBUTEROL SULFATE HFA 108 (90 BASE) MCG/ACT IN AERS: 1.0000 | INHALATION_SPRAY | RESPIRATORY_TRACT | 0 refills | Status: AC | PRN

## 2024-03-01 MED ORDER — BENZONATATE 200 MG PO CAPS: 200.0000 mg | ORAL_CAPSULE | Freq: Three times a day (TID) | ORAL | 0 refills | Status: DC | PRN

## 2024-03-01 MED ORDER — PREDNISONE 20 MG PO TABS: 40.0000 mg | ORAL_TABLET | Freq: Every day | ORAL | 0 refills | Status: AC

## 2024-03-01 NOTE — ED Triage Notes (Signed)
 Pt is with her daughter  Pt c/o cough x1-8month  Pt states that she has wheezing, SOB, and the cough is productive.

## 2024-03-01 NOTE — ED Provider Notes (Signed)
 HPI  SUBJECTIVE:  Sandy Eaton is a 83 y.o. female who presents with 1 month of a cough, chest congestion, sneezing that has gotten worse over the past week.  She reports chest and back soreness from coughing, chest tightness, chills, nasal congestion, rhinorrhea, sinus pain and pressure, postnasal drip, dyspnea on exertion.  She states that her cough is now productive of white milky sputum.  She is unable to sleep at night because of the cough.  No documented fevers.  No antibiotics in the past 3 months.  No antipyretic in the past 6 hours.  She has tried Allegra, her Symbicort , albuterol  which she is stating she needs more often, and is using nasal sprays prescribed to her for her allergies.  The Allegra, rest, ice Symbicort /albuterol  help.  Symptoms are worse with exertion.  No GERD symptoms.  Reports nausea with doxycycline  and Augmentin  but is able to tolerate this with Zofran , diarrhea with fluoroquinolones, She has a past medical history of asthma, allergies, pneumonia x 3, migraine, hyperlipidemia, GERD.  She states pulmonology told her that she does not have COPD.  PCP: She established care with [facility] last week, had blood work done, was told that she may have an infection, but has not been treated yet.  Additional history obtained from daughter.  Past Medical History:  Diagnosis Date   Allergy    Arthritis    Asthma    Chronic asthmatic bronchitis (HCC)    COPD (chronic obstructive pulmonary disease) (HCC)    Suspected   Depression    Dyspnea    on exertion   Environmental allergies    GERD (gastroesophageal reflux disease)    Glaucoma    Headache    migraine without aura, not intractable   Hyperlipidemia    Osteopenia    of lumbar spine   Pneumonia    3 times    Past Surgical History:  Procedure Laterality Date   AMPUTATION TOE     APPENDECTOMY     COLONOSCOPY WITH PROPOFOL  N/A 08/19/2020   Procedure: COLONOSCOPY WITH PROPOFOL ;  Surgeon: Maryruth Ole DASEN, MD;  Location: [facility] ENDOSCOPY;  Service: Endoscopy;  Laterality: N/A;  [facility] COVID POSITIVE TEST - ATTACHED TO CHART   ESOPHAGOGASTRODUODENOSCOPY N/A 10/11/2021   Procedure: ESOPHAGOGASTRODUODENOSCOPY (EGD);  Surgeon: Dessa Reyes ORN, MD;  Location: [facility] ENDOSCOPY;  Service: Endoscopy;  Laterality: N/A;   ESOPHAGOGASTRODUODENOSCOPY (EGD) WITH PROPOFOL  N/A 08/19/2020   Procedure: ESOPHAGOGASTRODUODENOSCOPY (EGD) WITH PROPOFOL ;  Surgeon: Maryruth Ole DASEN, MD;  Location: [facility] ENDOSCOPY;  Service: Endoscopy;  Laterality: N/A;   FEMORAL HERNIA REPAIR Right 01/15/2017   Procedure: HERNIA REPAIR FEMORAL;  Surgeon: Claudene Larinda Bolder, MD;  Location: [facility] ORS;  Service: General;  Laterality: Right;   HERNIA REPAIR Right    ROTATOR CUFF REPAIR Right    TONSILLECTOMY     VAGINAL HYSTERECTOMY     1960's    Family History  Problem Relation Age of Onset   Arthritis Mother    Other Mother        MVA   Hearing loss Father    Arthritis Father    COPD Father    Hypertension Sister    Hyperlipidemia Sister    Hearing loss Sister    Diabetes Sister    Hypertension Brother    Hearing loss Brother    Arthritis Brother    Hearing loss Brother    Arthritis Brother    Kidney disease Daughter    Asthma Daughter  Cancer Daughter        breast   Breast cancer Daughter    Asthma Grandchild     Social History   Tobacco Use   Smoking status: Former    Current packs/day: 0.00    Average packs/day: 0.5 packs/day for 30.0 years (15.0 ttl pk-yrs)    Types: Cigarettes    Start date: 02/25/1979    Quit date: 02/24/2009    Years since quitting: 15.0   Smokeless tobacco: Never  Vaping Use   Vaping status: Never Used  Substance Use Topics   Alcohol use: No   Drug use: No    No current facility-administered medications for this encounter.  Current Outpatient Medications:    albuterol  (VENTOLIN  HFA) 108 (90 Base) MCG/ACT inhaler, Inhale 2 puffs into the lungs every 4 (four) hours as  needed., Disp: 18 g, Rfl: 0   albuterol  (VENTOLIN  HFA) 108 (90 Base) MCG/ACT inhaler, Inhale 1-2 puffs into the lungs every 4 (four) hours as needed for wheezing or shortness of breath., Disp: 1 each, Rfl: 0   amoxicillin -clavulanate (AUGMENTIN ) 875-125 MG tablet, Take 1 tablet by mouth every 12 (twelve) hours., Disp: 14 tablet, Rfl: 0   aspirin  EC 81 MG tablet, Take 81 mg by mouth daily., Disp: , Rfl:    benzonatate  (TESSALON ) 200 MG capsule, Take 1 capsule (200 mg total) by mouth 3 (three) times daily as needed for cough., Disp: 30 capsule, Rfl: 0   cholecalciferol  (VITAMIN D3) 25 MCG (1000 UNIT) tablet, Take 1,000 Units by mouth daily., Disp: , Rfl:    gabapentin  (NEURONTIN ) 300 MG capsule, Take 300 mg by mouth 2 (two) times daily., Disp: , Rfl:    ipratropium (ATROVENT ) 0.06 % nasal spray, Place 2 sprays into both nostrils 4 (four) times daily., Disp: 15 mL, Rfl: 12   ipratropium-albuterol  (DUONEB) 0.5-2.5 (3) MG/3ML SOLN, Take 3 mLs by nebulization 4 (four) times daily as needed for shortness of breath or wheezing., Disp: , Rfl:    montelukast  (SINGULAIR ) 10 MG tablet, Take 1 tablet (10 mg total) by mouth at bedtime., Disp: 30 tablet, Rfl: 5   omeprazole (PRILOSEC) 40 MG capsule, SMARTSIG:1 Capsule(s) By Mouth Every Evening, Disp: , Rfl:    ondansetron  (ZOFRAN -ODT) 4 MG disintegrating tablet, Take 1 tablet (4 mg total) by mouth every 8 (eight) hours as needed for nausea or vomiting., Disp: 20 tablet, Rfl: 0   predniSONE  (DELTASONE ) 20 MG tablet, Take 2 tablets (40 mg total) by mouth daily with breakfast for 5 days., Disp: 10 tablet, Rfl: 0   sertraline  (ZOLOFT ) 50 MG tablet, Take 50 mg by mouth daily., Disp: , Rfl:    Spacer/Aero-Holding Chambers (AEROCHAMBER MV) inhaler, Use as instructed, Disp: 1 each, Rfl: 1   SYMBICORT  80-4.5 MCG/ACT inhaler, Inhale 2 puffs into the lungs 2 (two) times daily., Disp: 7 g, Rfl: 11   vitamin B-12 (CYANOCOBALAMIN ) 1000 MCG tablet, Take 1,000 mcg by mouth daily.,  Disp: , Rfl:    acetaminophen  (TYLENOL ) 500 MG tablet, Take 1,000 mg by mouth 2 (two) times daily as needed for mild pain or headache. (Patient not taking: No sig reported), Disp: , Rfl:    Albuterol  Sulfate (PROAIR  RESPICLICK) 108 (90 Base) MCG/ACT AEPB, Inhale 2 puffs into the lungs every 4 (four) hours as needed. (Patient not taking: No sig reported), Disp: 1 each, Rfl: 3   [Paused] cetirizine  (ZYRTEC ) 5 MG tablet, Take 5 mg by mouth daily., Disp: , Rfl:    formoterol  (PERFOROMIST ) 20 MCG/2ML nebulizer  solution, Inhale into the lungs. (Patient not taking: Reported on 12/14/2022), Disp: , Rfl:    moxifloxacin (VIGAMOX) 0.5 % ophthalmic solution, Place 1 drop into the right eye 4 (four) times daily., Disp: , Rfl:   Allergies  Allergen Reactions   Doxycycline  Nausea Only   Fluticasone -Salmeterol Other (See Comments)    Thrush   Levaquin  [Levofloxacin ] Diarrhea    Abdominal pain and diarrhea with nausea but no vomiting    Amoxicillin -Pot Clavulanate Nausea And Vomiting and Nausea Only    Has patient had a PCN reaction causing immediate rash, facial/tongue/throat swelling, SOB or lightheadedness with hypotension: No  Has patient had a PCN reaction causing severe rash involving mucus membranes or skin necrosis: No  Has patient had a PCN reaction that required hospitalization: No  Has patient had a PCN reaction occurring within the last 10 years: No  If all of the above answers are NO, then may proceed with Cephalosporin use.   Diazepam Other (See Comments)    Reaction:  Hallucinations   Escitalopram Oxalate Diarrhea     ROS  As noted in HPI.   Physical Exam  BP 117/81 (BP Location: Left Arm)   Pulse 80   Temp 97.7 F (36.5 C) (Oral)   Wt 67.3 kg   SpO2 93%   BMI 25.46 kg/m   Constitutional: Well developed, well nourished, no acute distress.  Speaking in full sentences. Eyes: PERRL, EOMI, conjunctiva normal bilaterally HENT: Normocephalic, atraumatic,mucus membranes moist.   Normal turbinates.  Positive nasal congestion.  Positive maxillary sinus tenderness.  No frontal sinus tenderness.  No obvious postnasal drip, cobblestoning. Respiratory: Fair air movement, occasional scattered wheezing.  Positive anterior chest wall tenderness. Cardiovascular: Normal rate and rhythm, no murmurs, no gallops, no rubs GI: nondistended skin: No rash, skin intact Musculoskeletal: no deformities Neurologic: Alert & oriented x 3, CN III-XII grossly intact, no motor deficits, sensation grossly intact Psychiatric: Speech and behavior appropriate   ED Course   Medications - No data to display  Orders Placed This Encounter  Procedures   DG Chest 2 View    Standing Status:   Standing    Number of Occurrences:   1    Reason for Exam (SYMPTOM  OR DIAGNOSIS REQUIRED):   Cough for month getting worse past week, history of pneumonia x 3, asthma, rule out recurrent pneumonia.   No results found for this or any previous visit (from the past 24 hours). DG Chest 2 View Result Date: 03/01/2024 CLINICAL DATA:  Cough.  Asthma. EXAM: CHEST - 2 VIEW COMPARISON:  11/11/2023 FINDINGS: Tortuous thoracic aorta.  Heart size within normal limits. 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 The lungs appear clear. No blunting of the costophrenic angles. No acute skeletal findings. IMPRESSION: 1. No acute findings. 2. 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 Electronically Signed   By: Ryan Salvage M.D.   On: 03/01/2024 12:52    ED Clinical Impression  1. Subacute cough   2. Acute non-recurrent maxillary sinusitis   3. Moderate persistent asthma with acute exacerbation      ED Assessment/Plan     Calculated creatinine clearance from labs done 2 days ago 75 mL/min.  Reviewed imaging independently.  No acute cardiopulmonary disease.  Formal  radiology report pending.  Will call daughter Nathanel at [phone] if radiology overread differs enough from mine  and we need to change management.    Reviewed radiology report.  No acute cardio pulmonary disease consistent with my read.  See radiology report for full details.  Presentation concerning for secondary acute maxillary sinusitis/asthma/COPD exacerbation.  Home with regularly scheduled albuterol  inhaler/spacer or nebulizer treatment for 4 days, then as needed thereafter.  Will write for spacer.  She does not need prescription for albuterol  /Symbicort .  Augmentin  875 mg p.o. twice daily for 7 days, Zofran  due to the nausea that she gets with Augmentin .  Prednisone  40 mg for 5 days, continue nasal spray, start saline nasal irrigation, discontinue Allegra, start Mucinex .  Tessalon  for cough.  Follow-up with PCP as needed.  ER return precautions given.  Discussed labs, imaging, MDM, treatment plan, and plan for follow-up with patient and family member.  Discussed sn/sx that should prompt return to the ED. they agree with plan  Meds ordered this encounter  Medications   amoxicillin -clavulanate (AUGMENTIN ) 875-125 MG tablet    Sig: Take 1 tablet by mouth every 12 (twelve) hours.    Dispense:  14 tablet    Refill:  0   albuterol  (VENTOLIN  HFA) 108 (90 Base) MCG/ACT inhaler    Sig: Inhale 1-2 puffs into the lungs every 4 (four) hours as needed for wheezing or shortness of breath.    Dispense:  1 each    Refill:  0   Spacer/Aero-Holding Chambers (AEROCHAMBER MV) inhaler    Sig: Use as instructed    Dispense:  1 each    Refill:  1   predniSONE  (DELTASONE ) 20 MG tablet    Sig: Take 2 tablets (40 mg total) by mouth daily with breakfast for 5 days.    Dispense:  10 tablet    Refill:  0   benzonatate  (TESSALON ) 200 MG capsule    Sig: Take 1 capsule (200 mg total) by mouth 3 (three) times daily as needed for cough.    Dispense:  30 capsule    Refill:  0   ondansetron  (ZOFRAN -ODT) 4 MG  disintegrating tablet    Sig: Take 1 tablet (4 mg total) by mouth every 8 (eight) hours as needed for nausea or vomiting.    Dispense:  20 tablet    Refill:  0      *This clinic note was created using Scientist, clinical (histocompatibility and immunogenetics). Therefore, there may be occasional mistakes despite careful proofreading. ?    Van Knee, MD 03/02/24 [id number]

## 2024-03-01 NOTE — Discharge Instructions (Addendum)
 I did not appreciate any pneumonia on your x-ray.  We will contact you if the radiology overread differs enough from mine and we need to change management.  Take two puffs from your albuterol  inhaler with your spacer every 4 hours for 2 days, then every 6 hours for 2 days, then as needed. You can back off if you start to improve  sooner.  Or you can do an albuterol  treatment on the schedule.  Finish the steroids unless your doctor tells you to stop. Finish the antibiotics, even if you feel better.  Make sure you drink extra fluids. continue nasal sprays, start saline nasal irrigation with a [employer]Med sinus rinse and distilled water as often as you want, discontinue Allegra, start Mucinex  for now.  Tessalon  for cough. Return to the ER if you get worse, have a fever >100.4, or any other concerns.   If the spacer is too expensive at the pharmacy, you can get an AeroChamber Z-Stat off of [employer] for about $10-$15.  Go to www.goodrx.com  or www.costplusdrugs.com to look up your medications. This will give you a list of where you can find your prescriptions at the most affordable prices. Or ask the pharmacist what the cash price is, or if they have any other discount programs available to help make your medication more affordable. This can be less expensive than what you would pay with insurance.

## 2024-03-17 ENCOUNTER — Ambulatory Visit: Payer: Self-pay | Admitting: Nurse Practitioner

## 2024-03-17 DIAGNOSIS — J4489 Other specified chronic obstructive pulmonary disease: Secondary | ICD-10-CM | POA: Insufficient documentation

## 2024-03-17 NOTE — Assessment & Plan Note (Signed)
 GERD managed with daily omeprazole, symptoms exacerbated by certain foods.

## 2024-03-17 NOTE — Progress Notes (Signed)
 The blood work shows no sign of anemia, electrolytes, liver, kidney and thyroid   normal. Cholesterol elevated: Manage diet and exercise. Will repeat before next OV. Please schedule fasting lab.

## 2024-03-17 NOTE — Assessment & Plan Note (Signed)
 Cramps, and restless legs at night, worsened by prolonged standing. -Continue gabapentin  as needed.

## 2024-03-17 NOTE — Assessment & Plan Note (Signed)
 She is followed by pulmonology. -Continue Symbicort  as SMART therapy.

## 2024-03-17 NOTE — Assessment & Plan Note (Signed)
    02/28/2024    4:09 PM  GAD 7 : Generalized Anxiety Score  Nervous, Anxious, on Edge 1  Control/stop worrying 1  Worry too much - different things 2  Trouble relaxing 0  Restless 1  Easily annoyed or irritable 1  Afraid - awful might happen 0  Total GAD 7 Score 6  Anxiety Difficulty Not difficult at all       02/28/2024    4:09 PM  Depression screen PHQ 2/9  Decreased Interest 0  Down, Depressed, Hopeless 1  PHQ - 2 Score 1  Altered sleeping 0  Tired, decreased energy 0  Change in appetite 0  Feeling bad or failure about yourself  0  Trouble concentrating 0  Moving slowly or fidgety/restless 1  Suicidal thoughts 0  PHQ-9 Score 2  Difficult doing work/chores Not difficult at all  -Chronic stable. -Continue Zoloft  50 mg dail, occasional low mood or anxiety but generally positive outlook.

## 2024-03-19 NOTE — Progress Notes (Signed)
 She had runny nose and can take OTC antihistamine like zyrtec , Claritin .

## 2024-05-31 ENCOUNTER — Ambulatory Visit
Admission: EM | Admit: 2024-05-31 | Discharge: 2024-05-31 | Disposition: A | Discharge status: Home / Self Care | Attending: Emergency Medicine | Admitting: Emergency Medicine

## 2024-05-31 ENCOUNTER — Ambulatory Visit

## 2024-05-31 ENCOUNTER — Encounter: Payer: Self-pay | Admitting: Emergency Medicine

## 2024-05-31 DIAGNOSIS — W19XXXA Unspecified fall, initial encounter: Secondary | ICD-10-CM | POA: Diagnosis not present

## 2024-05-31 DIAGNOSIS — M25551 Pain in right hip: Secondary | ICD-10-CM

## 2024-05-31 DIAGNOSIS — M8580 Other specified disorders of bone density and structure, unspecified site: Secondary | ICD-10-CM

## 2024-05-31 DIAGNOSIS — M25552 Pain in left hip: Secondary | ICD-10-CM

## 2024-05-31 NOTE — ED Triage Notes (Signed)
 Pt states she fell 2 weeks ago. She fell out of her kitchen chair. Pt c/o pelvic pain, left hip pain. She states she fractured her pelvis a could of years ago.

## 2024-05-31 NOTE — ED Provider Notes (Signed)
 [facility]    CSN: [id number] Arrival date & time: 05/31/24  1004      History   Chief Complaint Chief Complaint  Patient presents with   Fall    HPI Sandy Eaton is a 83 y.o. female.   83 year old female, Sandy Eaton, presents to urgent care for evaluation of bilateral pelvic pain and left hip pain after falling out of her kitchen chair 2 weeks ago while decorating for Christmas.  Patient states she fractured pelvis a few years prior.  The history is provided by the patient. No language interpreter was used.  Fall    Past Medical History:  Diagnosis Date   Allergy    Arthritis    Asthma    Chronic asthmatic bronchitis (HCC)    COPD (chronic obstructive pulmonary disease) (HCC)    Suspected   Depression    Dyspnea    on exertion   Environmental allergies    GERD (gastroesophageal reflux disease)    Glaucoma    Headache    migraine without aura, not intractable   Hyperlipidemia    Osteopenia    of lumbar spine   Pneumonia    3 times    Patient Active Problem List   Diagnosis Date Noted   Bilateral hip pain 05/31/2024   Fall 05/31/2024   Osteopenia 05/31/2024   Chronic asthmatic bronchitis (HCC) 03/17/2024   Seasonal allergic rhinitis 02/28/2024   Irritable bowel syndrome with diarrhea 09/20/2023   Anxiety and depression 10/11/2022   Restless legs 10/11/2022   Chronic rhinitis 07/28/2021   Multiple closed stable fractures of pubic ramus (HCC) 06/26/2021   Multiple closed pelvic fractures without disruption of pelvic circle (HCC)    Nausea    COVID-19 virus infection    Hypoxia    Neuropathy    Pubic ramus fracture (HCC) 06/25/2021   GERD (gastroesophageal reflux disease) 06/25/2021   COPD (chronic obstructive pulmonary disease) (HCC) 01/02/2019   Pneumonia 05/05/2015   Hospital acquired PNA 11/26/2014   DOE (dyspnea on exertion) 02/24/2014   Cough 02/24/2014   Dyslipidemia 03/18/2013   Hyperlipidemia 03/18/2013    Osteoporosis 03/18/2013    Past Surgical History:  Procedure Laterality Date   AMPUTATION TOE     APPENDECTOMY     COLONOSCOPY WITH PROPOFOL  N/A 08/19/2020   Procedure: COLONOSCOPY WITH PROPOFOL ;  Surgeon: Maryruth Ole DASEN, MD;  Location: [facility] ENDOSCOPY;  Service: Endoscopy;  Laterality: N/A;  [facility] COVID POSITIVE TEST - ATTACHED TO CHART   ESOPHAGOGASTRODUODENOSCOPY N/A 10/11/2021   Procedure: ESOPHAGOGASTRODUODENOSCOPY (EGD);  Surgeon: Dessa Reyes ORN, MD;  Location: [facility] ENDOSCOPY;  Service: Endoscopy;  Laterality: N/A;   ESOPHAGOGASTRODUODENOSCOPY (EGD) WITH PROPOFOL  N/A 08/19/2020   Procedure: ESOPHAGOGASTRODUODENOSCOPY (EGD) WITH PROPOFOL ;  Surgeon: Maryruth Ole DASEN, MD;  Location: [facility] ENDOSCOPY;  Service: Endoscopy;  Laterality: N/A;   FEMORAL HERNIA REPAIR Right 01/15/2017   Procedure: HERNIA REPAIR FEMORAL;  Surgeon: Claudene Larinda Bolder, MD;  Location: [facility] ORS;  Service: General;  Laterality: Right;   HERNIA REPAIR Right    ROTATOR CUFF REPAIR Right    TONSILLECTOMY     VAGINAL HYSTERECTOMY     1960's    OB History     Gravida  3   Para      Term      Preterm      AB      Living  2      SAB      IAB      Ectopic  Multiple      Live Births               Home Medications    Prior to Admission medications  Medication Sig Start Date End Date Taking? Authorizing Provider  acetaminophen  (TYLENOL ) 500 MG tablet Take 1,000 mg by mouth 2 (two) times daily as needed for mild pain or headache. Patient not taking: No sig reported    [provider]  albuterol  (VENTOLIN  HFA) 108 (90 Base) MCG/ACT inhaler Inhale 2 puffs into the lungs every 4 (four) hours as needed. 11/11/23   Bernardino Ditch, NP  albuterol  (VENTOLIN  HFA) 108 (90 Base) MCG/ACT inhaler Inhale 1-2 puffs into the lungs every 4 (four) hours as needed for wheezing or shortness of breath. 03/01/24   Mortenson, Ashley, MD  Albuterol  Sulfate (PROAIR  RESPICLICK) 108 (90 Base) MCG/ACT  AEPB Inhale 2 puffs into the lungs every 4 (four) hours as needed. Patient not taking: No sig reported 09/27/20   Hope Almarie ORN, NP  amoxicillin -clavulanate (AUGMENTIN ) 875-125 MG tablet Take 1 tablet by mouth every 12 (twelve) hours. 03/01/24   Van Knee, MD  aspirin  EC 81 MG tablet Take 81 mg by mouth daily.    [provider]  benzonatate  (TESSALON ) 200 MG capsule Take 1 capsule (200 mg total) by mouth 3 (three) times daily as needed for cough. 03/01/24   Van Knee, MD  cetirizine  (ZYRTEC ) 5 MG tablet Take 5 mg by mouth daily.    [provider]  cholecalciferol  (VITAMIN D3) 25 MCG (1000 UNIT) tablet Take 1,000 Units by mouth daily.    [provider]  formoterol  (PERFOROMIST ) 20 MCG/2ML nebulizer solution Inhale into the lungs. Patient not taking: Reported on 12/14/2022 12/22/21 12/22/22  [provider]  gabapentin  (NEURONTIN ) 300 MG capsule Take 300 mg by mouth 2 (two) times daily.    [provider]  ipratropium (ATROVENT ) 0.06 % nasal spray Place 2 sprays into both nostrils 4 (four) times daily. 11/11/23   Bernardino Ditch, NP  ipratropium-albuterol  (DUONEB) 0.5-2.5 (3) MG/3ML SOLN Take 3 mLs by nebulization 4 (four) times daily as needed for shortness of breath or wheezing.    [provider]  montelukast  (SINGULAIR ) 10 MG tablet Take 1 tablet (10 mg total) by mouth at bedtime. 09/27/20   Hope Almarie ORN, NP  moxifloxacin (VIGAMOX) 0.5 % ophthalmic solution Place 1 drop into the right eye 4 (four) times daily. 02/07/23   [provider]  omeprazole (PRILOSEC) 40 MG capsule SMARTSIG:1 Capsule(s) By Mouth Every Evening 12/17/22   [provider]  ondansetron  (ZOFRAN -ODT) 4 MG disintegrating tablet Take 1 tablet (4 mg total) by mouth every 8 (eight) hours as needed for nausea or vomiting. 03/01/24   Van Knee, MD  sertraline  (ZOLOFT ) 50 MG tablet Take 50 mg by mouth daily. 07/23/19   [provider]   Spacer/Aero-Holding Raguel (AEROCHAMBER MV) inhaler Use as instructed 03/01/24   Van Knee, MD  SYMBICORT  80-4.5 MCG/ACT inhaler Inhale 2 puffs into the lungs 2 (two) times daily. 12/12/23   Isadora Hose, MD  vitamin B-12 (CYANOCOBALAMIN ) 1000 MCG tablet Take 1,000 mcg by mouth daily.    [provider]    Family History Family History  Problem Relation Age of Onset   Arthritis Mother    Other Mother        MVA   Hearing loss Father    Arthritis Father    COPD Father    Hypertension Sister    Hyperlipidemia Sister  Hearing loss Sister    Diabetes Sister    Hypertension Brother    Hearing loss Brother    Arthritis Brother    Hearing loss Brother    Arthritis Brother    Kidney disease Daughter    Asthma Daughter    Cancer Daughter        breast   Breast cancer Daughter    Asthma Grandchild     Social History Social History[1]   Allergies   Doxycycline , Fluticasone -salmeterol, Levaquin  [levofloxacin ], Amoxicillin -pot clavulanate, Diazepam, and Escitalopram oxalate   Review of Systems Review of Systems  Constitutional:  Negative for fever.  Genitourinary:  Positive for pelvic pain.  Musculoskeletal:  Positive for arthralgias.  All other systems reviewed and are negative.    Physical Exam Triage Vital Signs ED Triage Vitals  Encounter Vitals Group     BP 05/31/24 1028 111/76     Girls Systolic BP Percentile --      Girls Diastolic BP Percentile --      Boys Systolic BP Percentile --      Boys Diastolic BP Percentile --      Pulse Rate 05/31/24 1028 87     Resp 05/31/24 1028 16     Temp 05/31/24 1028 (!) 97.5 F (36.4 C)     Temp Source 05/31/24 1028 Oral     SpO2 05/31/24 1028 93 %     Weight --      Height --      Head Circumference --      Peak Flow --      Pain Score 05/31/24 1027 8     Pain Loc --      Pain Education --      Exclude from Growth Chart --    No data found.  Updated Vital Signs BP 111/76 (BP Location: Right  Arm)   Pulse 87   Temp (!) 97.5 F (36.4 C) (Oral)   Resp 16   SpO2 93%   Visual Acuity Right Eye Distance:   Left Eye Distance:   Bilateral Distance:    Right Eye Near:   Left Eye Near:    Bilateral Near:     Physical Exam Vitals and nursing note reviewed.  Cardiovascular:     Rate and Rhythm: Normal rate.     Pulses:          Dorsalis pedis pulses are 2+ on the right side and 2+ on the left side.  Pulmonary:     Effort: Pulmonary effort is normal.  Musculoskeletal:     Right hip: Bony tenderness present.     Left hip: Bony tenderness present.       Legs:     Comments: No bruising or deformity noted  Neurological:     General: No focal deficit present.     Mental Status: She is alert and oriented to person, place, and time.     GCS: GCS eye subscore is 4. GCS verbal subscore is 5. GCS motor subscore is 6.  Psychiatric:        Attention and Perception: Attention normal.        Mood and Affect: Mood normal.        Speech: Speech normal.        Behavior: Behavior normal. Behavior is cooperative.      UC Treatments / Results  Labs (all labs ordered are listed, but only abnormal results are displayed) Labs Reviewed - No data to display  EKG   Radiology  DG Hips Bilat W or Wo Pelvis 5 Views Result Date: 05/31/2024 CLINICAL DATA:  Fall 2 weeks ago EXAM: DG HIP (WITH OR WITHOUT PELVIS) 5+V BILAT COMPARISON:  June 25, 2021 FINDINGS: Osteopenia. No acute fracture or dislocation. Similar contour of the LEFT pubic ramus along pubic symphysis compared to prior consistent with sequela of remote prior fracture. Degenerative changes of the lower lumbar spine. Hip joint spaces are relatively preserved with mild osteophyte formation. No unexpected radiopaque foreign body. Soft tissues are unremarkable. IMPRESSION: 1. No acute fracture or dislocation. 2. If there is a persistent clinical concern for nondisplaced hip or pelvic fracture, recommend dedicated pelvic CT or MRI.  Electronically Signed   By: Corean Salter M.D.   On: 05/31/2024 11:38    Procedures Procedures (including critical care time)  Medications Ordered in UC Medications - No data to display  Initial Impression / Assessment and Plan / UC Course  I have reviewed the triage vital signs and the nursing notes.  Pertinent labs & imaging results that were available during my care of the patient were reviewed by me and considered in my medical decision making (see chart for details).    Discussed exam findings and plan of care with patient, xrays negative for acute findings, pt notified of osteopenia, will follow up with orthopedist at [facility], strict go to ER precautions given.   Patient verbalized understanding to this provider.  Ddx: Bilateral hip pain, fall, osteopenia, arthralgia Final Clinical Impressions(s) / UC Diagnoses   Final diagnoses:  Bilateral hip pain  Fall, initial encounter  Osteopenia, unspecified location     Discharge Instructions       Your xrays were negative for acute findings. Please refrain from climbing on ladders, chairs, or any furniture as it greatly increases your chances for falling and injuring.  Please follow up with Orthopedics-call for appt([facility])May use over the counter tylenol /ibuprofen as label directed for pain.  Go to ER for new or worsening issues or concerns.     ED Prescriptions   None    PDMP not reviewed this encounter.     [1]  Social History Tobacco Use   Smoking status: Former    Current packs/day: 0.00    Average packs/day: 0.5 packs/day for 30.0 years (15.0 ttl pk-yrs)    Types: Cigarettes    Start date: 02/25/1979    Quit date: 02/24/2009    Years since quitting: 15.2   Smokeless tobacco: Never  Vaping Use   Vaping status: Never Used  Substance Use Topics   Alcohol use: No   Drug use: No     Denard Tuminello, Rilla, NP 05/31/24 1331

## 2024-05-31 NOTE — Discharge Instructions (Addendum)
°  Your xrays were negative for acute findings. Please refrain from climbing on ladders, chairs, or any furniture as it greatly increases your chances for falling and injuring.  Please follow up with Orthopedics-call for appt([facility])May use over the counter tylenol /ibuprofen as label directed for pain.  Go to ER for new or worsening issues or concerns.

## 2024-06-09 ENCOUNTER — Ambulatory Visit

## 2024-06-09 VITALS — BP 112/72 | HR 86 | Ht 64.0 in | Wt 148.0 lb

## 2024-06-09 DIAGNOSIS — Z0000 Encounter for general adult medical examination without abnormal findings: Secondary | ICD-10-CM | POA: Diagnosis not present

## 2024-06-09 NOTE — Progress Notes (Signed)
 "  Chief Complaint  Patient presents with   Medicare Wellness     Subjective:   Sandy Eaton is a 83 y.o. female who presents for a Medicare Annual Wellness Visit.  Visit info / Clinical Intake: Medicare Wellness Visit Type:: Subsequent Annual Wellness Visit Persons participating in visit and providing information:: patient Medicare Wellness Visit Mode:: Telephone If telephone:: video declined Since this visit was completed virtually, some vitals may be partially provided or unavailable. Missing vitals are due to the limitations of the virtual format.: Unable to obtain vitals - no equipment If Telephone or Video please confirm:: I connected with patient using audio/video enable telemedicine. I verified patient identity with two identifiers, discussed telehealth limitations, and patient agreed to proceed. Patient Location:: home Provider Location:: home office Interpreter Needed?: No Pre-visit prep was completed: yes AWV questionnaire completed by patient prior to visit?: no Living arrangements:: with family/others Patient's Overall Health Status Rating: very good Typical amount of pain: none Does pain affect daily life?: no Are you currently prescribed opioids?: no  Dietary Habits and Nutritional Risks How many meals a day?: 3 Eats fruit and vegetables daily?: yes Most meals are obtained by: preparing own meals In the last 2 weeks, have you had any of the following?: (!) nausea, vomiting, diarrhea Diabetic:: no  Functional Status Activities of Daily Living (to include ambulation/medication): Independent Ambulation: Independent Medication Administration: Independent Home Management (perform basic housework or laundry): Independent Manage your own finances?: yes Primary transportation is: family / friends Concerns about vision?: no *vision screening is required for [facility]* (last ov year ago/has eye apt coming up/) Concerns about hearing?: (!) yes Uses hearing aids?: (!)  yes  Fall Screening Falls in the past year?: 1 Number of falls in past year: 0 Was there an injury with Fall?: 0 Fall Risk Category Calculator: 1 Patient Fall Risk Level: Low Fall Risk  Fall Risk Patient at Risk for Falls Due to: Impaired balance/gait; Impaired mobility Fall risk Follow up: Falls evaluation completed; Education provided  Home and Transportation Safety: All rugs have non-skid backing?: yes All stairs or steps have railings?: yes Grab bars in the bathtub or shower?: yes Have non-skid surface in bathtub or shower?: yes Good home lighting?: yes Regular seat belt use?: yes Hospital stays in the last year:: no  Cognitive Assessment Difficulty concentrating, remembering, or making decisions? : no Will 6CIT or Mini Cog be Completed: yes What year is it?: 0 points What month is it?: 0 points Give patient an address phrase to remember (5 components): [address] About what time is it?: 0 points Count backwards from 20 to 1: 0 points Say the months of the year in reverse: 0 points Repeat the address phrase from earlier: 0 points 6 CIT Score: 0 points  Advance Directives (For Healthcare) Does Patient Have a Medical Advance Directive?: No Would patient like information on creating a medical advance directive?: No - Patient declined  Reviewed/Updated  Reviewed/Updated: Reviewed All (Medical, Surgical, Family, Medications, Allergies, Care Teams, Patient Goals); Medical History; Surgical History; Family History; Medications; Allergies; Care Teams; Patient Goals    Allergies (verified) Doxycycline , Fluticasone -salmeterol, Levaquin  [levofloxacin ], Amoxicillin -pot clavulanate, Diazepam, and Escitalopram oxalate   Current Medications (verified) Outpatient Encounter Medications as of 06/09/2024  Medication Sig   albuterol  (VENTOLIN  HFA) 108 (90 Base) MCG/ACT inhaler Inhale 2 puffs into the lungs every 4 (four) hours as needed.   albuterol  (VENTOLIN  HFA)  108 (90 Base) MCG/ACT inhaler Inhale 1-2 puffs into the lungs every  4 (four) hours as needed for wheezing or shortness of breath.   amoxicillin -clavulanate (AUGMENTIN ) 875-125 MG tablet Take 1 tablet by mouth every 12 (twelve) hours.   aspirin  EC 81 MG tablet Take 81 mg by mouth daily.   benzonatate  (TESSALON ) 200 MG capsule Take 1 capsule (200 mg total) by mouth 3 (three) times daily as needed for cough.   cetirizine  (ZYRTEC ) 5 MG tablet Take 5 mg by mouth daily.   cholecalciferol  (VITAMIN D3) 25 MCG (1000 UNIT) tablet Take 1,000 Units by mouth daily.   gabapentin  (NEURONTIN ) 300 MG capsule Take 300 mg by mouth 2 (two) times daily.   ipratropium (ATROVENT ) 0.06 % nasal spray Place 2 sprays into both nostrils 4 (four) times daily.   ipratropium-albuterol  (DUONEB) 0.5-2.5 (3) MG/3ML SOLN Take 3 mLs by nebulization 4 (four) times daily as needed for shortness of breath or wheezing.   montelukast  (SINGULAIR ) 10 MG tablet Take 1 tablet (10 mg total) by mouth at bedtime.   moxifloxacin (VIGAMOX) 0.5 % ophthalmic solution Place 1 drop into the right eye 4 (four) times daily.   omeprazole (PRILOSEC) 40 MG capsule SMARTSIG:1 Capsule(s) By Mouth Every Evening   ondansetron  (ZOFRAN -ODT) 4 MG disintegrating tablet Take 1 tablet (4 mg total) by mouth every 8 (eight) hours as needed for nausea or vomiting.   sertraline  (ZOLOFT ) 50 MG tablet Take 50 mg by mouth daily.   Spacer/Aero-Holding Chambers (AEROCHAMBER MV) inhaler Use as instructed   SYMBICORT  80-4.5 MCG/ACT inhaler Inhale 2 puffs into the lungs 2 (two) times daily.   vitamin B-12 (CYANOCOBALAMIN ) 1000 MCG tablet Take 1,000 mcg by mouth daily.   acetaminophen  (TYLENOL ) 500 MG tablet Take 1,000 mg by mouth 2 (two) times daily as needed for mild pain or headache. (Patient not taking: Reported on 06/09/2024)   Albuterol  Sulfate (PROAIR  RESPICLICK) 108 (90 Base) MCG/ACT AEPB Inhale 2 puffs into the lungs every 4 (four) hours as needed. (Patient not taking:  Reported on 06/09/2024)   formoterol  (PERFOROMIST ) 20 MCG/2ML nebulizer solution Inhale into the lungs. (Patient not taking: Reported on 06/09/2024)   No facility-administered encounter medications on file as of 06/09/2024.    History: Past Medical History:  Diagnosis Date   Allergy    Arthritis    Asthma    Chronic asthmatic bronchitis (HCC)    COPD (chronic obstructive pulmonary disease) (HCC)    Suspected   Depression    Dyspnea    on exertion   Environmental allergies    GERD (gastroesophageal reflux disease)    Glaucoma    Headache    migraine without aura, not intractable   Hyperlipidemia    Osteopenia    of lumbar spine   Pneumonia    3 times   Past Surgical History:  Procedure Laterality Date   AMPUTATION TOE     APPENDECTOMY     COLONOSCOPY WITH PROPOFOL  N/A 08/19/2020   Procedure: COLONOSCOPY WITH PROPOFOL ;  Surgeon: Maryruth Ole DASEN, MD;  Location: [facility] ENDOSCOPY;  Service: Endoscopy;  Laterality: N/A;  [facility] COVID POSITIVE TEST - ATTACHED TO CHART   ESOPHAGOGASTRODUODENOSCOPY N/A 10/11/2021   Procedure: ESOPHAGOGASTRODUODENOSCOPY (EGD);  Surgeon: Dessa Reyes ORN, MD;  Location: [facility] ENDOSCOPY;  Service: Endoscopy;  Laterality: N/A;   ESOPHAGOGASTRODUODENOSCOPY (EGD) WITH PROPOFOL  N/A 08/19/2020   Procedure: ESOPHAGOGASTRODUODENOSCOPY (EGD) WITH PROPOFOL ;  Surgeon: Maryruth Ole DASEN, MD;  Location: [facility] ENDOSCOPY;  Service: Endoscopy;  Laterality: N/A;   FEMORAL HERNIA REPAIR Right 01/15/2017   Procedure: HERNIA REPAIR FEMORAL;  Surgeon: Claudene Fontana  Unknown, MD;  Location: [facility] ORS;  Service: General;  Laterality: Right;   HERNIA REPAIR Right    ROTATOR CUFF REPAIR Right    TONSILLECTOMY     VAGINAL HYSTERECTOMY     1960's   Family History  Problem Relation Age of Onset   Arthritis Mother    Other Mother        MVA   Hearing loss Father    Arthritis Father    COPD Father    Hypertension Sister    Hyperlipidemia Sister    Hearing loss Sister     Diabetes Sister    Hypertension Brother    Hearing loss Brother    Arthritis Brother    Hearing loss Brother    Arthritis Brother    Kidney disease Daughter    Asthma Daughter    Cancer Daughter        breast   Breast cancer Daughter    Asthma Grandchild    Social History   Occupational History   Not on file  Tobacco Use   Smoking status: Former    Current packs/day: 0.00    Average packs/day: 0.5 packs/day for 30.0 years (15.0 ttl pk-yrs)    Types: Cigarettes    Start date: 02/25/1979    Quit date: 02/24/2009    Years since quitting: 15.2   Smokeless tobacco: Never  Vaping Use   Vaping status: Never Used  Substance and Sexual Activity   Alcohol use: No   Drug use: No   Sexual activity: Not on file   Tobacco Counseling Counseling given: Yes  SDOH Screenings   Food Insecurity: No Food Insecurity (06/09/2024)  Housing: Unknown (06/09/2024)  Transportation Needs: No Transportation Needs (06/09/2024)  Utilities: Not At Risk (06/09/2024)  Depression (PHQ2-9): Low Risk (06/09/2024)  Financial Resource Strain: High Risk (10/11/2022)   Received from [facility]  Physical Activity: Insufficiently Active (06/09/2024)  Social Connections: Moderately Isolated (06/09/2024)  Stress: No Stress Concern Present (06/09/2024)  Tobacco Use: Medium Risk (06/09/2024)  Health Literacy: Adequate Health Literacy (06/09/2024)   See flowsheets for full screening details  Depression Screen PHQ 2 & 9 Depression Scale- Over the past 2 weeks, how often have you been bothered by any of the following problems? Little interest or pleasure in doing things: 0 Feeling down, depressed, or hopeless (PHQ Adolescent also includes...irritable): 0 PHQ-2 Total Score: 0 Trouble falling or staying asleep, or sleeping too much: 0 Feeling tired or having little energy: 0 Poor appetite or overeating (PHQ Adolescent also includes...weight loss): 0 Feeling bad about yourself - or that you  are a failure or have let yourself or your family down: 0 Trouble concentrating on things, such as reading the newspaper or watching television (PHQ Adolescent also includes...like school work): 0 Moving or speaking so slowly that other people could have noticed. Or the opposite - being so fidgety or restless that you have been moving around a lot more than usual: 1 Thoughts that you would be better off dead, or of hurting yourself in some way: 0 PHQ-9 Total Score: 2 If you checked off any problems, how difficult have these problems made it for you to do your work, take care of things at home, or get along with other people?: Not difficult at all     Goals Addressed             This Visit's Progress    Prevent Falls and Injury  Objective:    Today's Vitals   06/09/24 1345  BP: 112/72  Pulse: 86  Weight: 148 lb (67.1 kg)  Height: 5' 4 (1.626 m)   Body mass index is 25.4 kg/m.  Hearing/Vision screen No results found. Immunizations and Health Maintenance Health Maintenance  Topic Date Due   Bone Density Scan  Never done   COVID-19 Vaccine (7 - 2025-26 season) 02/17/2024   Medicare Annual Wellness (AWV)  06/09/2025   DTaP/Tdap/Td (3 - Td or Tdap) 10/31/2032   Pneumococcal Vaccine: 50+ Years  Completed   Influenza Vaccine  Completed   Zoster Vaccines- Shingrix  Completed   Meningococcal B Vaccine  Aged Out        Assessment/Plan:  This is a routine wellness examination for Tyjanae.  Patient Care Team: Kaur, Charanpreet, NP as PCP - General (Nurse Practitioner)  I have personally reviewed and noted the following in the patients chart:   Medical and social history Use of alcohol, tobacco or illicit drugs  Current medications and supplements including opioid prescriptions. Functional ability and status Nutritional status Physical activity Advanced directives List of other physicians Hospitalizations, surgeries, and ER visits in previous 12  months Vitals Screenings to include cognitive, depression, and falls Referrals and appointments  No orders of the defined types were placed in this encounter.  In addition, I have reviewed and discussed with patient certain preventive protocols, quality metrics, and best practice recommendations. A written personalized care plan for preventive services as well as general preventive health recommendations were provided to patient.   Ozie Ned, CMA   06/09/2024   Return in 1 year (on 06/09/2025).  After Visit Summary: (MyChart) Due to this being a telephonic visit, the after visit summary with patients personalized plan was offered to patient via MyChart   Nurse Notes: PCP F/U: pt is aware and due the following: Dexa and covid and pneumonia vaccines "

## 2024-06-09 NOTE — Patient Instructions (Signed)
 Ms. Rish,  Thank you for taking the time for your Medicare Wellness Visit. I appreciate your continued commitment to your health goals. Please review the care plan we discussed, and feel free to reach out if I can assist you further.  Please note that Annual Wellness Visits do not include a physical exam. Some assessments may be limited, especially if the visit was conducted virtually. If needed, we may recommend an in-person follow-up with your provider.  Ongoing Care Seeing your primary care provider every 3 to 6 months helps us  monitor your health and provide consistent, personalized care.   Referrals If a referral was made during today's visit and you haven't received any updates within two weeks, please contact the referred provider directly to check on the status.  Recommended Screenings:  Health Maintenance  Topic Date Due   Osteoporosis screening with Bone Density Scan  Never done   COVID-19 Vaccine (7 - 2025-26 season) 02/17/2024   Medicare Annual Wellness Visit  12/08/2024   DTaP/Tdap/Td vaccine (3 - Td or Tdap) 10/31/2032   Pneumococcal Vaccine for age over 76  Completed   Flu Shot  Completed   Zoster (Shingles) Vaccine  Completed   Meningitis B Vaccine  Aged Out       06/09/2024    1:48 PM  Advanced Directives  Does Patient Have a Medical Advance Directive? No  Would patient like information on creating a medical advance directive? No - Patient declined    Vision: Annual vision screenings are recommended for early detection of glaucoma, cataracts, and diabetic retinopathy. These exams can also reveal signs of chronic conditions such as diabetes and high blood pressure.  Dental: Annual dental screenings help detect early signs of oral cancer, gum disease, and other conditions linked to overall health, including heart disease and diabetes.  Please see the attached documents for additional preventive care recommendations.

## 2024-07-01 ENCOUNTER — Other Ambulatory Visit: Payer: Self-pay

## 2024-07-01 DIAGNOSIS — E785 Hyperlipidemia, unspecified: Secondary | ICD-10-CM

## 2024-07-03 ENCOUNTER — Other Ambulatory Visit

## 2024-07-03 ENCOUNTER — Encounter: Payer: Self-pay | Admitting: Nurse Practitioner

## 2024-07-03 ENCOUNTER — Ambulatory Visit (INDEPENDENT_AMBULATORY_CARE_PROVIDER_SITE_OTHER): Admitting: Nurse Practitioner

## 2024-07-03 VITALS — BP 124/82 | HR 68 | Temp 98.0°F | Ht 64.0 in | Wt 146.6 lb

## 2024-07-03 DIAGNOSIS — N644 Mastodynia: Secondary | ICD-10-CM

## 2024-07-03 DIAGNOSIS — R519 Headache, unspecified: Secondary | ICD-10-CM

## 2024-07-03 DIAGNOSIS — J4540 Moderate persistent asthma, uncomplicated: Secondary | ICD-10-CM

## 2024-07-03 DIAGNOSIS — F419 Anxiety disorder, unspecified: Secondary | ICD-10-CM

## 2024-07-03 DIAGNOSIS — E785 Hyperlipidemia, unspecified: Secondary | ICD-10-CM

## 2024-07-03 DIAGNOSIS — F32A Depression, unspecified: Secondary | ICD-10-CM

## 2024-07-03 LAB — COMPREHENSIVE METABOLIC PANEL WITH GFR
ALT: 12 U/L (ref 3–35)
AST: 14 U/L (ref 5–37)
Albumin: 4.1 g/dL (ref 3.5–5.2)
Alkaline Phosphatase: 88 U/L (ref 39–117)
BUN: 6 mg/dL (ref 6–23)
CO2: 29 meq/L (ref 19–32)
Calcium: 8.8 mg/dL (ref 8.4–10.5)
Chloride: 107 meq/L (ref 96–112)
Creatinine, Ser: 0.55 mg/dL (ref 0.40–1.20)
GFR: 84.43 mL/min
Glucose, Bld: 95 mg/dL (ref 70–99)
Potassium: 3.8 meq/L (ref 3.5–5.1)
Sodium: 143 meq/L (ref 135–145)
Total Bilirubin: 0.5 mg/dL (ref 0.2–1.2)
Total Protein: 6.2 g/dL (ref 6.0–8.3)

## 2024-07-03 LAB — CBC WITH DIFFERENTIAL/PLATELET
Basophils Absolute: 0.1 K/uL (ref 0.0–0.1)
Basophils Relative: 0.9 % (ref 0.0–3.0)
Eosinophils Absolute: 0.5 K/uL (ref 0.0–0.7)
Eosinophils Relative: 6.5 % — ABNORMAL HIGH (ref 0.0–5.0)
HCT: 40.8 % (ref 36.0–46.0)
Hemoglobin: 13.8 g/dL (ref 12.0–15.0)
Lymphocytes Relative: 30.8 % (ref 12.0–46.0)
Lymphs Abs: 2.3 K/uL (ref 0.7–4.0)
MCHC: 33.9 g/dL (ref 30.0–36.0)
MCV: 93.3 fl (ref 78.0–100.0)
Monocytes Absolute: 0.5 K/uL (ref 0.1–1.0)
Monocytes Relative: 7 % (ref 3.0–12.0)
Neutro Abs: 4.1 K/uL (ref 1.4–7.7)
Neutrophils Relative %: 54.8 % (ref 43.0–77.0)
Platelets: 191 K/uL (ref 150.0–400.0)
RBC: 4.37 Mil/uL (ref 3.87–5.11)
RDW: 12.1 % (ref 11.5–15.5)
WBC: 7.5 K/uL (ref 4.0–10.5)

## 2024-07-03 LAB — LIPID PANEL
Cholesterol: 265 mg/dL — ABNORMAL HIGH (ref 28–200)
HDL: 40.8 mg/dL
LDL Cholesterol: 150 mg/dL — ABNORMAL HIGH (ref 10–99)
NonHDL: 224.14
Total CHOL/HDL Ratio: 6
Triglycerides: 371 mg/dL — ABNORMAL HIGH (ref 10.0–149.0)
VLDL: 74.2 mg/dL — ABNORMAL HIGH (ref 0.0–40.0)

## 2024-07-03 MED ORDER — MONTELUKAST SODIUM 10 MG PO TABS: 10.0000 mg | ORAL_TABLET | Freq: Every day | ORAL | 1 refills | Status: AC

## 2024-07-03 MED ORDER — SERTRALINE HCL 50 MG PO TABS: 50.0000 mg | ORAL_TABLET | Freq: Every day | ORAL | 1 refills | Status: AC

## 2024-07-03 MED ORDER — OMEPRAZOLE 40 MG PO CPDR: 40.0000 mg | DELAYED_RELEASE_CAPSULE | Freq: Every day | ORAL | 1 refills | Status: AC

## 2024-07-03 NOTE — Patient Instructions (Signed)
 PLEASE CALL AND GET THE Mammogram and US  SCHEDULED! [facility] - call [phone]

## 2024-07-03 NOTE — Progress Notes (Unsigned)
 "  Established Patient Office Visit  Subjective:  Patient ID: Sandy Eaton, female    DOB: 07/03/1940  Age: 84 y.o. MRN: [id number]  CC: No chief complaint on file.  Discussed the use of AI scribe software for clinical note transcription with the patient, who gave verbal consent to proceed.  History of Present Illness     Past Medical History:  Diagnosis Date   Allergy    Arthritis    Asthma    Chronic asthmatic bronchitis (HCC)    COPD (chronic obstructive pulmonary disease) (HCC)    Suspected   Depression    Dyspnea    on exertion   Environmental allergies    GERD (gastroesophageal reflux disease)    Glaucoma    Headache    migraine without aura, not intractable   Hyperlipidemia    Osteopenia    of lumbar spine   Pneumonia    3 times    Past Surgical History:  Procedure Laterality Date   AMPUTATION TOE     APPENDECTOMY     COLONOSCOPY WITH PROPOFOL  N/A 08/19/2020   Procedure: COLONOSCOPY WITH PROPOFOL ;  Surgeon: Maryruth Ole DASEN, MD;  Location: [facility] ENDOSCOPY;  Service: Endoscopy;  Laterality: N/A;  [facility] COVID POSITIVE TEST - ATTACHED TO CHART   ESOPHAGOGASTRODUODENOSCOPY N/A 10/11/2021   Procedure: ESOPHAGOGASTRODUODENOSCOPY (EGD);  Surgeon: Dessa Reyes ORN, MD;  Location: [facility] ENDOSCOPY;  Service: Endoscopy;  Laterality: N/A;   ESOPHAGOGASTRODUODENOSCOPY (EGD) WITH PROPOFOL  N/A 08/19/2020   Procedure: ESOPHAGOGASTRODUODENOSCOPY (EGD) WITH PROPOFOL ;  Surgeon: Maryruth Ole DASEN, MD;  Location: [facility] ENDOSCOPY;  Service: Endoscopy;  Laterality: N/A;   FEMORAL HERNIA REPAIR Right 01/15/2017   Procedure: HERNIA REPAIR FEMORAL;  Surgeon: Claudene Larinda Bolder, MD;  Location: [facility] ORS;  Service: General;  Laterality: Right;   HERNIA REPAIR Right    ROTATOR CUFF REPAIR Right    TONSILLECTOMY     VAGINAL HYSTERECTOMY     1960's    Family History  Problem Relation Age of Onset   Arthritis Mother    Other Mother        MVA   Hearing loss Father     Arthritis Father    COPD Father    Hypertension Sister    Hyperlipidemia Sister    Hearing loss Sister    Diabetes Sister    Hypertension Brother    Hearing loss Brother    Arthritis Brother    Hearing loss Brother    Arthritis Brother    Kidney disease Daughter    Asthma Daughter    Cancer Daughter        breast   Breast cancer Daughter    Asthma Grandchild     Social History   Socioeconomic History   Marital status: Single    Spouse name: Not on file   Number of children: Not on file   Years of education: Not on file   Highest education level: Not on file  Occupational History   Not on file  Tobacco Use   Smoking status: Former    Current packs/day: 0.00    Average packs/day: 0.5 packs/day for 30.0 years (15.0 ttl pk-yrs)    Types: Cigarettes    Start date: 02/25/1979    Quit date: 02/24/2009    Years since quitting: 15.3   Smokeless tobacco: Never  Vaping Use   Vaping status: Never Used  Substance and Sexual Activity   Alcohol use: No   Drug use: No   Sexual activity: Not on file  Other Topics Concern   Not on file  Social History Narrative   Not on file   Social Drivers of Health   Tobacco Use: Medium Risk (06/09/2024)   Patient History    Smoking Tobacco Use: Former    Smokeless Tobacco Use: Never    Passive Exposure: Not on file  Financial Resource Strain: High Risk (10/11/2022)   Received from [facility]   Overall Financial Resource Strain (CARDIA)    Difficulty of Paying Living Expenses: Hard  Food Insecurity: No Food Insecurity (06/09/2024)   Epic    Worried About Running Out of Food in the Last Year: Never true    Ran Out of Food in the Last Year: Never true  Transportation Needs: No Transportation Needs (06/09/2024)   Epic    Lack of Transportation (Medical): No    Lack of Transportation (Non-Medical): No  Physical Activity: Insufficiently Active (06/09/2024)   Exercise Vital Sign    Days of Exercise per Week: 3 days     Minutes of Exercise per Session: 30 min  Stress: No Stress Concern Present (06/09/2024)   [employer] of Occupational Health - Occupational Stress Questionnaire    Feeling of Stress: Not at all  Social Connections: Moderately Isolated (06/09/2024)   Social Connection and Isolation Panel    Frequency of Communication with Friends and Family: More than three times a week    Frequency of Social Gatherings with Friends and Family: More than three times a week    Attends Religious Services: Never    Database Administrator or Organizations: No    Attends Engineer, Structural: More than 4 times per year    Marital Status: Divorced  Intimate Partner Violence: Not At Risk (06/09/2024)   Epic    Fear of Current or Ex-Partner: No    Emotionally Abused: No    Physically Abused: No    Sexually Abused: No  Depression (PHQ2-9): Low Risk (06/09/2024)   Depression (PHQ2-9)    PHQ-2 Score: 0  Alcohol Screen: Not on file  Housing: Unknown (06/09/2024)   Epic    Unable to Pay for Housing in the Last Year: No    Number of Times Moved in the Last Year: Not on file    Homeless in the Last Year: No  Utilities: Not At Risk (06/09/2024)   Epic    Threatened with loss of utilities: No  Health Literacy: Adequate Health Literacy (06/09/2024)   B1300 Health Literacy    Frequency of need for help with medical instructions: Never     Outpatient Medications Prior to Visit  Medication Sig Dispense Refill   acetaminophen  (TYLENOL ) 500 MG tablet Take 1,000 mg by mouth 2 (two) times daily as needed for mild pain or headache. (Patient not taking: Reported on 06/09/2024)     albuterol  (VENTOLIN  HFA) 108 (90 Base) MCG/ACT inhaler Inhale 2 puffs into the lungs every 4 (four) hours as needed. 18 g 0   albuterol  (VENTOLIN  HFA) 108 (90 Base) MCG/ACT inhaler Inhale 1-2 puffs into the lungs every 4 (four) hours as needed for wheezing or shortness of breath. 1 each 0   Albuterol  Sulfate (PROAIR  RESPICLICK)  108 (90 Base) MCG/ACT AEPB Inhale 2 puffs into the lungs every 4 (four) hours as needed. (Patient not taking: Reported on 06/09/2024) 1 each 3   amoxicillin -clavulanate (AUGMENTIN ) 875-125 MG tablet Take 1 tablet by mouth every 12 (twelve) hours. 14 tablet 0   aspirin  EC 81 MG tablet Take 81  mg by mouth daily.     benzonatate  (TESSALON ) 200 MG capsule Take 1 capsule (200 mg total) by mouth 3 (three) times daily as needed for cough. 30 capsule 0   cetirizine  (ZYRTEC ) 5 MG tablet Take 5 mg by mouth daily.     cholecalciferol  (VITAMIN D3) 25 MCG (1000 UNIT) tablet Take 1,000 Units by mouth daily.     formoterol  (PERFOROMIST ) 20 MCG/2ML nebulizer solution Inhale into the lungs. (Patient not taking: Reported on 06/09/2024)     gabapentin  (NEURONTIN ) 300 MG capsule Take 300 mg by mouth 2 (two) times daily.     ipratropium (ATROVENT ) 0.06 % nasal spray Place 2 sprays into both nostrils 4 (four) times daily. 15 mL 12   ipratropium-albuterol  (DUONEB) 0.5-2.5 (3) MG/3ML SOLN Take 3 mLs by nebulization 4 (four) times daily as needed for shortness of breath or wheezing.     montelukast  (SINGULAIR ) 10 MG tablet Take 1 tablet (10 mg total) by mouth at bedtime. 30 tablet 5   moxifloxacin (VIGAMOX) 0.5 % ophthalmic solution Place 1 drop into the right eye 4 (four) times daily.     omeprazole  (PRILOSEC) 40 MG capsule SMARTSIG:1 Capsule(s) By Mouth Every Evening     ondansetron  (ZOFRAN -ODT) 4 MG disintegrating tablet Take 1 tablet (4 mg total) by mouth every 8 (eight) hours as needed for nausea or vomiting. 20 tablet 0   sertraline  (ZOLOFT ) 50 MG tablet Take 50 mg by mouth daily.     Spacer/Aero-Holding Chambers (AEROCHAMBER MV) inhaler Use as instructed 1 each 1   SYMBICORT  80-4.5 MCG/ACT inhaler Inhale 2 puffs into the lungs 2 (two) times daily. 7 g 11   vitamin B-12 (CYANOCOBALAMIN ) 1000 MCG tablet Take 1,000 mcg by mouth daily.     No facility-administered medications prior to visit.     Allergies[1]  ROS Review of Systems Negative unless indicated in HPI.    Objective:    Physical Exam Chest:     Chest wall: No tenderness.  Breasts:    Right: Normal. No nipple discharge or skin change.     Left: Normal. No nipple discharge or skin change.       There were no vitals taken for this visit. Wt Readings from Last 3 Encounters:  06/09/24 148 lb (67.1 kg)  03/01/24 148 lb 4.8 oz (67.3 kg)  02/28/24 148 lb 3.2 oz (67.2 kg)     Health Maintenance  Topic Date Due   Bone Density Scan  Never done   COVID-19 Vaccine (7 - 2025-26 season) 02/17/2024   Medicare Annual Wellness (AWV)  06/09/2025   DTaP/Tdap/Td (3 - Td or Tdap) 10/31/2032   Pneumococcal Vaccine: 50+ Years  Completed   Influenza Vaccine  Completed   Zoster Vaccines- Shingrix  Completed   Meningococcal B Vaccine  Aged Out    There are no preventive care reminders to display for this patient.  Lab Results  Component Value Date   TSH 1.29 02/28/2024   Lab Results  Component Value Date   WBC 7.7 02/28/2024   HGB 13.7 02/28/2024   HCT 41.3 02/28/2024   MCV 93.6 02/28/2024   PLT 192.0 02/28/2024   Lab Results  Component Value Date   NA 142 02/28/2024   K 3.9 02/28/2024   CO2 29 02/28/2024   GLUCOSE 116 (H) 02/28/2024   BUN 8 02/28/2024   CREATININE 0.60 02/28/2024   BILITOT 0.5 02/28/2024   ALKPHOS 89 02/28/2024   AST 17 02/28/2024   ALT 14 02/28/2024  PROT 6.4 02/28/2024   ALBUMIN 4.3 02/28/2024   CALCIUM  9.3 02/28/2024   ANIONGAP 7 06/26/2021   GFR 82.88 02/28/2024   Lab Results  Component Value Date   CHOL 280 (H) 02/28/2024   Lab Results  Component Value Date   HDL 45.80 02/28/2024   Lab Results  Component Value Date   LDLCALC 177 (H) 02/28/2024   Lab Results  Component Value Date   TRIG 287.0 (H) 02/28/2024   Lab Results  Component Value Date   CHOLHDL 6 02/28/2024   No results found for: HGBA1C    Assessment & Plan:   Assessment &  Plan   Assessment and Plan Assessment & Plan       Follow-up: No follow-ups on file.   Chelsea Aurora, NP     [1]  Allergies Allergen Reactions   Doxycycline  Nausea Only   Fluticasone -Salmeterol Other (See Comments)    Thrush   Levaquin  [Levofloxacin ] Diarrhea    Abdominal pain and diarrhea with nausea but no vomiting    Amoxicillin -Pot Clavulanate Nausea And Vomiting and Nausea Only    Has patient had a PCN reaction causing immediate rash, facial/tongue/throat swelling, SOB or lightheadedness with hypotension: No  Has patient had a PCN reaction causing severe rash involving mucus membranes or skin necrosis: No  Has patient had a PCN reaction that required hospitalization: No  Has patient had a PCN reaction occurring within the last 10 years: No  If all of the above answers are NO, then may proceed with Cephalosporin use.   Diazepam Other (See Comments)    Reaction:  Hallucinations   Escitalopram Oxalate Diarrhea   "

## 2024-07-06 DIAGNOSIS — J4540 Moderate persistent asthma, uncomplicated: Secondary | ICD-10-CM | POA: Insufficient documentation

## 2024-07-06 DIAGNOSIS — N644 Mastodynia: Secondary | ICD-10-CM | POA: Insufficient documentation

## 2024-07-06 DIAGNOSIS — R519 Headache, unspecified: Secondary | ICD-10-CM | POA: Insufficient documentation

## 2024-07-06 NOTE — Assessment & Plan Note (Signed)
 Persistent bilateral breast tenderness for about 1-2 months in the lower outer quadrant. - Ordered breast ultrasound and mammogram. Orders:   MM 3D DIAGNOSTIC MAMMOGRAM BILATERAL BREAST; Future   US  LIMITED ULTRASOUND INCLUDING AXILLA LEFT BREAST ; Future   US  LIMITED ULTRASOUND INCLUDING AXILLA RIGHT BREAST; Future

## 2024-07-06 NOTE — Assessment & Plan Note (Signed)
 Intermittent sharp headaches relieved by Tylenol . - Advised Tylenol  for relief. - Recommended follow-up with eye doct

## 2024-07-06 NOTE — Assessment & Plan Note (Signed)
 Symptoms managed with sertraline , but evening agitation persists. - Refilled sertraline  prescription.

## 2024-07-06 NOTE — Assessment & Plan Note (Signed)
 Followed by pulmonology. -Continue Symbicort  and as needed albuterol  inhaler.

## 2024-07-07 ENCOUNTER — Ambulatory Visit: Payer: Self-pay | Admitting: Nurse Practitioner

## 2024-07-07 ENCOUNTER — Other Ambulatory Visit: Payer: Self-pay | Admitting: Nurse Practitioner

## 2024-07-07 DIAGNOSIS — E785 Hyperlipidemia, unspecified: Secondary | ICD-10-CM

## 2024-07-07 MED ORDER — ROSUVASTATIN CALCIUM 5 MG PO TABS: 5.0000 mg | ORAL_TABLET | Freq: Every day | ORAL | 1 refills | Status: AC

## 2024-07-07 NOTE — Progress Notes (Signed)
 Please inform pt I have sent crestor  5 mg to the pharmacy.

## 2024-07-07 NOTE — Progress Notes (Signed)
 Please inform pt CBC are acceptable.  Liver, kidney and electrolyte normal. Cholesterol elevated, Is she taking cholesterol medication or not.

## 2024-07-08 ENCOUNTER — Other Ambulatory Visit: Payer: Self-pay

## 2024-07-08 ENCOUNTER — Telehealth: Payer: Self-pay

## 2024-07-08 NOTE — Telephone Encounter (Signed)
 Copied from CRM #[id number]. Topic: Clinical - Medication Refill >> Jul 08, 2024  3:43 PM Winona R wrote: Medication: gabapentin  (NEURONTIN ) 300 MG capsule  Has the patient contacted their pharmacy? No (Agent: If no, request that the patient contact the pharmacy for the refill. If patient does not wish to contact the pharmacy document the reason why and proceed with request.) (Agent: If yes, when and what did the pharmacy advise?)  This is the patient's preferred pharmacy:  [facility] [address]), [place] - [address] [place]) [place] [address] Phone: [phone] Fax: [phone] Is this the correct pharmacy for this prescription? Yes If no, delete pharmacy and type the correct one.   Has the prescription been filled recently? Yes  Is the patient out of the medication? No  Has the patient been seen for an appointment in the last year OR does the patient have an upcoming appointment? Yes  Can we respond through MyChart? Yes  Agent: Please be advised that Rx refills may take up to 3 business days. We ask that you follow-up with your pharmacy.

## 2024-07-08 NOTE — Telephone Encounter (Signed)
 Pended request and sent to [facility].

## 2024-07-08 NOTE — Progress Notes (Unsigned)
 LOV  07/03/24  NOV  08/07/24

## 2024-07-09 MED ORDER — GABAPENTIN 300 MG PO CAPS: 300.0000 mg | ORAL_CAPSULE | Freq: Two times a day (BID) | ORAL | 3 refills | Status: AC

## 2024-07-10 ENCOUNTER — Other Ambulatory Visit

## 2024-07-10 ENCOUNTER — Inpatient Hospital Stay: Admission: RE | Admit: 2024-07-10

## 2024-07-10 ENCOUNTER — Encounter: Payer: Self-pay | Admitting: Emergency Medicine

## 2024-07-10 ENCOUNTER — Ambulatory Visit
Admission: EM | Admit: 2024-07-10 | Discharge: 2024-07-10 | Disposition: A | Discharge status: Home / Self Care | Attending: Emergency Medicine | Admitting: Emergency Medicine

## 2024-07-10 DIAGNOSIS — R6889 Other general symptoms and signs: Secondary | ICD-10-CM | POA: Diagnosis not present

## 2024-07-10 DIAGNOSIS — J069 Acute upper respiratory infection, unspecified: Secondary | ICD-10-CM

## 2024-07-10 LAB — POC COVID19/FLU A&B COMBO
Covid Antigen, POC: NEGATIVE
Influenza A Antigen, POC: NEGATIVE
Influenza B Antigen, POC: NEGATIVE

## 2024-07-10 MED ORDER — PROMETHAZINE-DM 6.25-15 MG/5ML PO SYRP: 5.0000 mL | ORAL_SOLUTION | Freq: Four times a day (QID) | ORAL | 0 refills | Status: DC | PRN

## 2024-07-10 MED ORDER — BENZONATATE 100 MG PO CAPS: 200.0000 mg | ORAL_CAPSULE | Freq: Three times a day (TID) | ORAL | 0 refills | Status: DC

## 2024-07-10 MED ORDER — IPRATROPIUM BROMIDE 0.06 % NA SOLN: 2.0000 | Freq: Four times a day (QID) | NASAL | 12 refills | Status: AC

## 2024-07-10 MED ORDER — PREDNISONE 20 MG PO TABS: 40.0000 mg | ORAL_TABLET | Freq: Every day | ORAL | 0 refills | Status: AC

## 2024-07-10 NOTE — Discharge Instructions (Addendum)
 Your testing today was negative for COVID or influenza.  Based upon your exam and symptoms I do feel that you have a viral respiratory infection.  You may use over-the-counter Tylenol  according to the package instructions as needed for any pain.  Continue to use your albuterol  inhaler and nebulizer every 4-6 hours as needed for shortness of breath or wheezing.  Begin taking 40 mg of prednisone  daily and practice time for 5 days to help decrease airway inflammation and improve your breathing.  Use the Atrovent  nasal spray, 2 squirts in each nostril every 6 hours, as needed for runny nose and postnasal drip.  Use the Tessalon  Perles every 8 hours during the day.  Take them with a small sip of water.  They may give you some numbness to the base of your tongue or a metallic taste in your mouth, this is normal.  Use the Promethazine  DM cough syrup at bedtime for cough and congestion.  It will make you drowsy so do not take it during the day.  Return for reevaluation or see your primary care provider for any new or worsening symptoms.

## 2024-07-10 NOTE — ED Provider Notes (Signed)
 " [facility]    CSN: [id number] Arrival date & time: 07/10/24  1022      History   Chief Complaint Chief Complaint  Patient presents with   Cough    HPI Sandy Eaton is a 84 y.o. female.   HPI  84 year old female with past medical history significant for COPD, GERD, neuropathy, chronic rhinitis, anxiety and depression presents for evaluation of respiratory symptoms that started 2 days ago and include headache, sinus pressure with milky nasal discharge, productive cough for a milky sputum, shortness breath, and wheezing.  She has been using her rescue inhaler and nebulizer which has been helping.  Past Medical History:  Diagnosis Date   Allergy    Arthritis    Asthma    Chronic asthmatic bronchitis (HCC)    COPD (chronic obstructive pulmonary disease) (HCC)    Suspected   Depression    Dyspnea    on exertion   Environmental allergies    GERD (gastroesophageal reflux disease)    Glaucoma    Headache    migraine without aura, not intractable   Hyperlipidemia    Osteopenia    of lumbar spine   Pneumonia    3 times    Patient Active Problem List   Diagnosis Date Noted   Breast tenderness 07/06/2024   Acute nonintractable headache 07/06/2024   Moderate persistent asthma 07/06/2024   Bilateral hip pain 05/31/2024   Fall 05/31/2024   Osteopenia 05/31/2024   Chronic asthmatic bronchitis (HCC) 03/17/2024   Seasonal allergic rhinitis 02/28/2024   Irritable bowel syndrome with diarrhea 09/20/2023   Anxiety and depression 10/11/2022   Restless legs 10/11/2022   Chronic rhinitis 07/28/2021   Multiple closed stable fractures of pubic ramus (HCC) 06/26/2021   Multiple closed pelvic fractures without disruption of pelvic circle (HCC)    Nausea    COVID-19 virus infection    Hypoxia    Neuropathy    Pubic ramus fracture (HCC) 06/25/2021   GERD (gastroesophageal reflux disease) 06/25/2021   COPD (chronic obstructive pulmonary disease) (HCC) 01/02/2019    Pneumonia 05/05/2015   Hospital acquired PNA 11/26/2014   DOE (dyspnea on exertion) 02/24/2014   Cough 02/24/2014   Dyslipidemia 03/18/2013   Hyperlipidemia 03/18/2013   Osteoporosis 03/18/2013    Past Surgical History:  Procedure Laterality Date   AMPUTATION TOE     APPENDECTOMY     COLONOSCOPY WITH PROPOFOL  N/A 08/19/2020   Procedure: COLONOSCOPY WITH PROPOFOL ;  Surgeon: Maryruth Ole DASEN, MD;  Location: [facility] ENDOSCOPY;  Service: Endoscopy;  Laterality: N/A;  [facility] COVID POSITIVE TEST - ATTACHED TO CHART   ESOPHAGOGASTRODUODENOSCOPY N/A 10/11/2021   Procedure: ESOPHAGOGASTRODUODENOSCOPY (EGD);  Surgeon: Dessa Reyes ORN, MD;  Location: [facility] ENDOSCOPY;  Service: Endoscopy;  Laterality: N/A;   ESOPHAGOGASTRODUODENOSCOPY (EGD) WITH PROPOFOL  N/A 08/19/2020   Procedure: ESOPHAGOGASTRODUODENOSCOPY (EGD) WITH PROPOFOL ;  Surgeon: Maryruth Ole DASEN, MD;  Location: [facility] ENDOSCOPY;  Service: Endoscopy;  Laterality: N/A;   FEMORAL HERNIA REPAIR Right 01/15/2017   Procedure: HERNIA REPAIR FEMORAL;  Surgeon: Claudene Larinda Bolder, MD;  Location: [facility] ORS;  Service: General;  Laterality: Right;   HERNIA REPAIR Right    ROTATOR CUFF REPAIR Right    TONSILLECTOMY     VAGINAL HYSTERECTOMY     1960's    OB History     Gravida  3   Para      Term      Preterm      AB      Living  2      SAB      IAB      Ectopic      Multiple      Live Births               Home Medications    Prior to Admission medications  Medication Sig Start Date End Date Taking? Authorizing Provider  benzonatate  (TESSALON ) 100 MG capsule Take 2 capsules (200 mg total) by mouth every 8 (eight) hours. 07/10/24  Yes Bernardino Ditch, NP  ipratropium (ATROVENT ) 0.06 % nasal spray Place 2 sprays into both nostrils 4 (four) times daily. 07/10/24  Yes Bernardino Ditch, NP  predniSONE  (DELTASONE ) 20 MG tablet Take 2 tablets (40 mg total) by mouth daily with breakfast for 5 days. 3 tablets daily for 5 days.  07/10/24 07/15/24 Yes Bernardino Ditch, NP  promethazine -dextromethorphan (PROMETHAZINE -DM) 6.25-15 MG/5ML syrup Take 5 mLs by mouth 4 (four) times daily as needed. 07/10/24  Yes Bernardino Ditch, NP  acetaminophen  (TYLENOL ) 500 MG tablet Take 1,000 mg by mouth 2 (two) times daily as needed for mild pain or headache.    [provider]  albuterol  (VENTOLIN  HFA) 108 (90 Base) MCG/ACT inhaler Inhale 2 puffs into the lungs every 4 (four) hours as needed. 11/11/23   Bernardino Ditch, NP  albuterol  (VENTOLIN  HFA) 108 (90 Base) MCG/ACT inhaler Inhale 1-2 puffs into the lungs every 4 (four) hours as needed for wheezing or shortness of breath. 03/01/24   Mortenson, Ashley, MD  Albuterol  Sulfate (PROAIR  RESPICLICK) 108 (90 Base) MCG/ACT AEPB Inhale 2 puffs into the lungs every 4 (four) hours as needed. 09/27/20   Hope Almarie ORN, NP  aspirin  EC 81 MG tablet Take 81 mg by mouth daily.    [provider]  cetirizine  (ZYRTEC ) 5 MG tablet Take 5 mg by mouth daily.    [provider]  cholecalciferol  (VITAMIN D3) 25 MCG (1000 UNIT) tablet Take 1,000 Units by mouth daily.    [provider]  formoterol  (PERFOROMIST ) 20 MCG/2ML nebulizer solution Inhale into the lungs. 12/22/21 07/03/24  [provider]  gabapentin  (NEURONTIN ) 300 MG capsule Take 1 capsule (300 mg total) by mouth 2 (two) times daily. 07/09/24   Vincente Saber, NP  ipratropium-albuterol  (DUONEB) 0.5-2.5 (3) MG/3ML SOLN Take 3 mLs by nebulization 4 (four) times daily as needed for shortness of breath or wheezing.    [provider]  montelukast  (SINGULAIR ) 10 MG tablet Take 1 tablet (10 mg total) by mouth at bedtime. 07/03/24   Vincente Saber, NP  omeprazole  (PRILOSEC) 40 MG capsule Take 1 capsule (40 mg total) by mouth daily. 07/03/24   Kaur, Charanpreet, NP  ondansetron  (ZOFRAN -ODT) 4 MG disintegrating tablet Take 1 tablet (4 mg total) by mouth every 8 (eight) hours as needed for nausea or vomiting. 03/01/24    Van Knee, MD  rosuvastatin  (CRESTOR ) 5 MG tablet Take 1 tablet (5 mg total) by mouth daily. 07/07/24   Kaur, Charanpreet, NP  sertraline  (ZOLOFT ) 50 MG tablet Take 1 tablet (50 mg total) by mouth daily. 07/03/24   Vincente Saber, NP  Spacer/Aero-Holding Raguel (AEROCHAMBER MV) inhaler Use as instructed 03/01/24   Van Knee, MD  SYMBICORT  80-4.5 MCG/ACT inhaler Inhale 2 puffs into the lungs 2 (two) times daily. 12/12/23   Isadora Hose, MD  vitamin B-12 (CYANOCOBALAMIN ) 1000 MCG tablet Take 1,000 mcg by mouth daily.    [provider]    Family History Family History  Problem Relation Age of Onset  Arthritis Mother    Other Mother        MVA   Hearing loss Father    Arthritis Father    COPD Father    Hypertension Sister    Hyperlipidemia Sister    Hearing loss Sister    Diabetes Sister    Hypertension Brother    Hearing loss Brother    Arthritis Brother    Hearing loss Brother    Arthritis Brother    Kidney disease Daughter    Asthma Daughter    Cancer Daughter        breast   Breast cancer Daughter    Asthma Grandchild     Social History Social History[1]   Allergies   Doxycycline , Fluticasone -salmeterol, Levaquin  [levofloxacin ], Amoxicillin -pot clavulanate, Diazepam, and Escitalopram oxalate   Review of Systems Review of Systems  Constitutional:  Negative for fever.  HENT:  Positive for congestion, rhinorrhea and sinus pressure. Negative for ear pain.   Respiratory:  Positive for cough, shortness of breath and wheezing.      Physical Exam Triage Vital Signs ED Triage Vitals  Encounter Vitals Group     BP      Girls Systolic BP Percentile      Girls Diastolic BP Percentile      Boys Systolic BP Percentile      Boys Diastolic BP Percentile      Pulse      Resp      Temp      Temp src      SpO2      Weight      Height      Head Circumference      Peak Flow      Pain Score      Pain Loc      Pain Education      Exclude  from Growth Chart    No data found.  Updated Vital Signs BP 92/62 (BP Location: Right Arm)   Pulse (!) 106   Temp 98.4 F (36.9 C) (Oral)   Resp 16   Ht 5' 4 (1.626 m)   Wt 146 lb 9.7 oz (66.5 kg)   SpO2 92%   BMI 25.16 kg/m   Visual Acuity Right Eye Distance:   Left Eye Distance:   Bilateral Distance:    Right Eye Near:   Left Eye Near:    Bilateral Near:     Physical Exam Vitals and nursing note reviewed.  Constitutional:      Appearance: Normal appearance. She is not ill-appearing.  HENT:     Head: Normocephalic and atraumatic.     Nose: Congestion and rhinorrhea present.     Comments: Nasal mucosa is edematous and erythematous with milky discharge in both nares.    Mouth/Throat:     Mouth: Mucous membranes are moist.     Pharynx: Oropharynx is clear. Posterior oropharyngeal erythema present. No oropharyngeal exudate.     Comments: Mild erythema to the posterior pharynx with milky postnasal drip. Cardiovascular:     Rate and Rhythm: Normal rate and regular rhythm.     Pulses: Normal pulses.     Heart sounds: Normal heart sounds. No murmur heard.    No friction rub. No gallop.  Pulmonary:     Effort: Pulmonary effort is normal.     Breath sounds: Normal breath sounds. No wheezing, rhonchi or rales.  Musculoskeletal:     Cervical back: Normal range of motion and neck supple. No tenderness.  Lymphadenopathy:  Cervical: No cervical adenopathy.  Skin:    General: Skin is warm and dry.     Capillary Refill: Capillary refill takes less than 2 seconds.     Findings: No rash.  Neurological:     General: No focal deficit present.     Mental Status: She is alert and oriented to person, place, and time.      UC Treatments / Results  Labs (all labs ordered are listed, but only abnormal results are displayed) Labs Reviewed  POC COVID19/FLU A&B COMBO - Normal    EKG   Radiology No results found.  Procedures Procedures (including critical care  time)  Medications Ordered in UC Medications - No data to display  Initial Impression / Assessment and Plan / UC Course  I have reviewed the triage vital signs and the nursing notes.  Pertinent labs & imaging results that were available during my care of the patient were reviewed by me and considered in my medical decision making (see chart for details).   Patient is a nontoxic-appearing 84 year old female presenting for evaluation of 2 days of the respiratory symptoms as outlined in HPI above.  She does have a history of pneumonia as well as chronic asthmatic bronchitis.  She is currently able to speak in full sentence without dyspnea or tachypnea.  Respiratory rate in triage was 16 with a 92% room air oxygen saturation.  She is afebrile with an oral temp of 98.4.  Her lungs are clear to auscultation in all fields.  She does have milky nasal discharge as well as milky postnasal drip which I believe is what is triggering her cough.  I do not feel a chest x-ray is warranted at this time.  I will order a COVID and flu antigen test.  Respiratory antigen panel is negative for COVID influenza.  Will discharge patient with diagnosis of viral URI with cough.  She should continue to use her albuterol  inhaler and nebulizer as needed for any shortness breath or wheezing.  I will prescribe Atrovent  nasal spray for her nasal congestion and Tessalon  Perles and Promethazine  DM cough syrup for cough and congestion.  I will also prescribe a short burst of 40 mg prednisone  daily for 5 days to help decrease pulmonary inflammation and improve breathing.  Return precautions reviewed.   Final Clinical Impressions(s) / UC Diagnoses   Final diagnoses:  Influenza-like symptoms  Viral URI with cough     Discharge Instructions      Your testing today was negative for COVID or influenza.  Based upon your exam and symptoms I do feel that you have a viral respiratory infection.  You may use over-the-counter Tylenol   according to the package instructions as needed for any pain.  Continue to use your albuterol  inhaler and nebulizer every 4-6 hours as needed for shortness of breath or wheezing.  Begin taking 40 mg of prednisone  daily and practice time for 5 days to help decrease airway inflammation and improve your breathing.  Use the Atrovent  nasal spray, 2 squirts in each nostril every 6 hours, as needed for runny nose and postnasal drip.  Use the Tessalon  Perles every 8 hours during the day.  Take them with a small sip of water.  They may give you some numbness to the base of your tongue or a metallic taste in your mouth, this is normal.  Use the Promethazine  DM cough syrup at bedtime for cough and congestion.  It will make you drowsy so do not take it  during the day.  Return for reevaluation or see your primary care provider for any new or worsening symptoms.      ED Prescriptions     Medication Sig Dispense Auth. Provider   benzonatate  (TESSALON ) 100 MG capsule Take 2 capsules (200 mg total) by mouth every 8 (eight) hours. 21 capsule Bernardino Ditch, NP   ipratropium (ATROVENT ) 0.06 % nasal spray Place 2 sprays into both nostrils 4 (four) times daily. 15 mL Bernardino Ditch, NP   promethazine -dextromethorphan (PROMETHAZINE -DM) 6.25-15 MG/5ML syrup Take 5 mLs by mouth 4 (four) times daily as needed. 118 mL Bernardino Ditch, NP   predniSONE  (DELTASONE ) 20 MG tablet Take 2 tablets (40 mg total) by mouth daily with breakfast for 5 days. 3 tablets daily for 5 days. 10 tablet Bernardino Ditch, NP      PDMP not reviewed this encounter.     [1]  Social History Tobacco Use   Smoking status: Former    Current packs/day: 0.00    Average packs/day: 0.5 packs/day for 30.0 years (15.0 ttl pk-yrs)    Types: Cigarettes    Start date: 02/25/1979    Quit date: 02/24/2009    Years since quitting: 15.3   Smokeless tobacco: Never  Vaping Use   Vaping status: Never Used  Substance Use Topics   Alcohol use: No   Drug use:  No     Bernardino Ditch, NP 07/10/24 1120  "

## 2024-07-10 NOTE — ED Triage Notes (Signed)
 Patient c/o cough, chest congestion, headache, and sinus pressure that 2 days ago.  Patient does have history of respiratory problems. Patient unsure of fevers.

## 2024-07-17 ENCOUNTER — Ambulatory Visit
Admission: EM | Admit: 2024-07-17 | Discharge: 2024-07-17 | Disposition: A | Discharge status: Home / Self Care | Attending: Physician Assistant | Admitting: Physician Assistant

## 2024-07-17 ENCOUNTER — Ambulatory Visit

## 2024-07-17 ENCOUNTER — Encounter: Payer: Self-pay | Admitting: Emergency Medicine

## 2024-07-17 ENCOUNTER — Ambulatory Visit: Payer: Self-pay | Admitting: *Deleted

## 2024-07-17 DIAGNOSIS — Z8709 Personal history of other diseases of the respiratory system: Secondary | ICD-10-CM

## 2024-07-17 DIAGNOSIS — R051 Acute cough: Secondary | ICD-10-CM

## 2024-07-17 DIAGNOSIS — J189 Pneumonia, unspecified organism: Secondary | ICD-10-CM | POA: Diagnosis not present

## 2024-07-17 DIAGNOSIS — R0602 Shortness of breath: Secondary | ICD-10-CM

## 2024-07-17 MED ORDER — CEFDINIR 300 MG PO CAPS: 300.0000 mg | ORAL_CAPSULE | Freq: Two times a day (BID) | ORAL | 0 refills | Status: AC

## 2024-07-17 MED ORDER — AZITHROMYCIN 250 MG PO TABS: 250.0000 mg | ORAL_TABLET | Freq: Every day | ORAL | 0 refills | Status: AC

## 2024-07-17 MED ORDER — PROMETHAZINE-DM 6.25-15 MG/5ML PO SYRP: 5.0000 mL | ORAL_SOLUTION | Freq: Four times a day (QID) | ORAL | 0 refills | Status: AC | PRN

## 2024-07-17 MED ORDER — BENZONATATE 200 MG PO CAPS: 200.0000 mg | ORAL_CAPSULE | Freq: Three times a day (TID) | ORAL | 0 refills | Status: AC | PRN

## 2024-07-17 NOTE — ED Triage Notes (Signed)
 Pt cough, congestion, shortness of breath. Started about 9 days ago. She has been on prednisone , and cough syrup without relief.

## 2024-07-17 NOTE — Telephone Encounter (Signed)
 Attempted to call pt. Unable to leave message, memory is full. Pt told triage nurse that she would go back to UC to be evaluated.

## 2024-07-17 NOTE — Discharge Instructions (Addendum)
-  Chest xray does show pneumonia -I sent antibiotics and cough medicine to pharmacy. -Refilled your cough medicine -Use inhalers at home as needed -If fever or worsening breathing please return or go to ER -Repeat x-ray in a month

## 2024-07-17 NOTE — Telephone Encounter (Signed)
 FYI Only or Action Required?: Action required by provider: request for appointment and recommended UC , no available appt today .  Patient was last seen in primary care on 07/03/2024 by Vincente Saber, NP.  Called Nurse Triage reporting Cough.  Symptoms began a week ago.  Interventions attempted: Prescription medications: prednisone  , nebulizer treatments inhalers and Rest, hydration, or home remedies.  Symptoms are: gradually worsening.  Triage Disposition: See HCP Within 4 Hours (Or PCP Triage)  Patient/caregiver understands and will follow disposition?: Yes                Reason for Disposition  [1] MILD difficulty breathing (e.g., minimal/no SOB at rest, SOB with walking, pulse < 100) AND [2] still present when not coughing  Answer Assessment - Initial Assessment Questions Recommended UC due to no available appt today. Patient reports she has completed course of prednisone  and coughing is not getting better but more constant. Recommended warm liquids to drink. Patient reports she will go back to UC where she was seen before.        1. ONSET: When did the cough begin?      Prior to Saturday  2. SEVERITY: How bad is the cough today?      Moderate to severe unable to sleep 3. SPUTUM: Describe the color of your sputum (e.g., none, dry cough; clear, white, yellow, green)     Green  4. HEMOPTYSIS: Are you coughing up any blood? If Yes, ask: How much? (e.g., flecks, streaks, tablespoons, etc.)     Streaks of blood with coughing  5. DIFFICULTY BREATHING: Are you having difficulty breathing? If Yes, ask: How bad is it? (e.g., mild, moderate, severe)      SOB with coughing hx COPD 6. FEVER: Do you have a fever? If Yes, ask: What is your temperature, how was it measured, and when did it start?     No  7. CARDIAC HISTORY: Do you have any history of heart disease? (e.g., heart attack, congestive heart failure)      See Hx  8. LUNG HISTORY: Do you  have any history of lung disease?  (e.g., pulmonary embolus, asthma, emphysema)     Hx COPD  10. OTHER SYMPTOMS: Do you have any other symptoms? (e.g., runny nose, wheezing, chest pain)       Constant coughing, green phlegm. SOB with coughing, chest  tightness/pain with coughing. Back pain mid back with coughing. Sore throat headache.  Protocols used: Cough - Acute Productive-A-AH

## 2024-07-17 NOTE — ED Provider Notes (Signed)
 " [facility]    CSN: [id number] Arrival date & time: 07/17/24  1107      History   Chief Complaint Chief Complaint  Patient presents with   Cough    HPI Sandy Eaton is a 84 y.o. female with a history of asthma, COPD, GERD, hyperlipidemia, allergies, chronic rhinitis, anxiety/depression, osteopenia bilateral hip pain.  Patient presents today for 9-day history of illness.  Reports cough, congestion, runny nose, sinus pressure, scratchy throat. Cough is productive of green mucus. Increased SOB from baseline.  Seen here 1 week ago and had negative COVID/flu testing.  Was treated with benzonatate , prednisone  and Promethazine  DM without relief.  HPI  Past Medical History:  Diagnosis Date   Allergy    Arthritis    Asthma    Chronic asthmatic bronchitis (HCC)    COPD (chronic obstructive pulmonary disease) (HCC)    Suspected   Depression    Dyspnea    on exertion   Environmental allergies    GERD (gastroesophageal reflux disease)    Glaucoma    Headache    migraine without aura, not intractable   Hyperlipidemia    Osteopenia    of lumbar spine   Pneumonia    3 times    Patient Active Problem List   Diagnosis Date Noted   Breast tenderness 07/06/2024   Acute nonintractable headache 07/06/2024   Moderate persistent asthma 07/06/2024   Bilateral hip pain 05/31/2024   Fall 05/31/2024   Osteopenia 05/31/2024   Chronic asthmatic bronchitis (HCC) 03/17/2024   Seasonal allergic rhinitis 02/28/2024   Irritable bowel syndrome with diarrhea 09/20/2023   Anxiety and depression 10/11/2022   Restless legs 10/11/2022   Chronic rhinitis 07/28/2021   Multiple closed stable fractures of pubic ramus (HCC) 06/26/2021   Multiple closed pelvic fractures without disruption of pelvic circle (HCC)    Nausea    COVID-19 virus infection    Hypoxia    Neuropathy    Pubic ramus fracture (HCC) 06/25/2021   GERD (gastroesophageal reflux disease) 06/25/2021   COPD  (chronic obstructive pulmonary disease) (HCC) 01/02/2019   Pneumonia 05/05/2015   Hospital acquired PNA 11/26/2014   DOE (dyspnea on exertion) 02/24/2014   Cough 02/24/2014   Dyslipidemia 03/18/2013   Hyperlipidemia 03/18/2013   Osteoporosis 03/18/2013    Past Surgical History:  Procedure Laterality Date   AMPUTATION TOE     APPENDECTOMY     COLONOSCOPY WITH PROPOFOL  N/A 08/19/2020   Procedure: COLONOSCOPY WITH PROPOFOL ;  Surgeon: Maryruth Ole DASEN, MD;  Location: [facility] ENDOSCOPY;  Service: Endoscopy;  Laterality: N/A;  [facility] COVID POSITIVE TEST - ATTACHED TO CHART   ESOPHAGOGASTRODUODENOSCOPY N/A 10/11/2021   Procedure: ESOPHAGOGASTRODUODENOSCOPY (EGD);  Surgeon: Dessa Reyes ORN, MD;  Location: [facility]COPY;  Service: Endoscopy;  Laterality: N/A;   ESOPHAGOGASTRODUODENOSCOPY (EGD) WITH PROPOFOL  N/A 08/19/2020   Procedure: ESOPHAGOGASTRODUODENOSCOPY (EGD) WITH PROPOFOL ;  Surgeon: Maryruth Ole DASEN, MD;  Location: [facility] ENDOSCOPY;  Service: Endoscopy;  Laterality: N/A;   FEMORAL HERNIA REPAIR Right 01/15/2017   Procedure: HERNIA REPAIR FEMORAL;  Surgeon: Claudene Larinda Bolder, MD;  Location: [facility] ORS;  Service: General;  Laterality: Right;   HERNIA REPAIR Right    ROTATOR CUFF REPAIR Right    TONSILLECTOMY     VAGINAL HYSTERECTOMY     1960's    OB History     Gravida  3   Para      Term      Preterm      AB  Living  2      SAB      IAB      Ectopic      Multiple      Live Births               Home Medications    Prior to Admission medications  Medication Sig Start Date End Date Taking? Authorizing Provider  azithromycin  (ZITHROMAX ) 250 MG tablet Take 1 tablet (250 mg total) by mouth daily. Take first 2 tablets together, then 1 every day until finished. 07/17/24  Yes Arvis Jolan NOVAK, PA-C  benzonatate  (TESSALON ) 200 MG capsule Take 1 capsule (200 mg total) by mouth 3 (three) times daily as needed. 07/17/24  Yes Arvis Jolan B, PA-C  cefdinir   (OMNICEF ) 300 MG capsule Take 1 capsule (300 mg total) by mouth 2 (two) times daily for 7 days. 07/17/24 07/24/24 Yes Arvis Jolan B, PA-C  acetaminophen  (TYLENOL ) 500 MG tablet Take 1,000 mg by mouth 2 (two) times daily as needed for mild pain or headache.    [provider]  albuterol  (VENTOLIN  HFA) 108 (90 Base) MCG/ACT inhaler Inhale 2 puffs into the lungs every 4 (four) hours as needed. 11/11/23   Bernardino Ditch, NP  albuterol  (VENTOLIN  HFA) 108 (90 Base) MCG/ACT inhaler Inhale 1-2 puffs into the lungs every 4 (four) hours as needed for wheezing or shortness of breath. 03/01/24   Mortenson, Ashley, MD  Albuterol  Sulfate (PROAIR  RESPICLICK) 108 (90 Base) MCG/ACT AEPB Inhale 2 puffs into the lungs every 4 (four) hours as needed. 09/27/20   Hope Almarie ORN, NP  aspirin  EC 81 MG tablet Take 81 mg by mouth daily.    [provider]  cetirizine  (ZYRTEC ) 5 MG tablet Take 5 mg by mouth daily.    [provider]  cholecalciferol  (VITAMIN D3) 25 MCG (1000 UNIT) tablet Take 1,000 Units by mouth daily.    [provider]  formoterol  (PERFOROMIST ) 20 MCG/2ML nebulizer solution Inhale into the lungs. 12/22/21 07/03/24  [provider]  gabapentin  (NEURONTIN ) 300 MG capsule Take 1 capsule (300 mg total) by mouth 2 (two) times daily. 07/09/24   Kaur, Charanpreet, NP  ipratropium (ATROVENT ) 0.06 % nasal spray Place 2 sprays into both nostrils 4 (four) times daily. 07/10/24   Bernardino Ditch, NP  ipratropium-albuterol  (DUONEB) 0.5-2.5 (3) MG/3ML SOLN Take 3 mLs by nebulization 4 (four) times daily as needed for shortness of breath or wheezing.    [provider]  montelukast  (SINGULAIR ) 10 MG tablet Take 1 tablet (10 mg total) by mouth at bedtime. 07/03/24   Vincente Saber, NP  omeprazole  (PRILOSEC) 40 MG capsule Take 1 capsule (40 mg total) by mouth daily. 07/03/24   Kaur, Charanpreet, NP  ondansetron  (ZOFRAN -ODT) 4 MG disintegrating tablet Take 1 tablet (4 mg total) by  mouth every 8 (eight) hours as needed for nausea or vomiting. 03/01/24   Mortenson, Ashley, MD  promethazine -dextromethorphan (PROMETHAZINE -DM) 6.25-15 MG/5ML syrup Take 5 mLs by mouth 4 (four) times daily as needed for cough. 07/17/24   Arvis Jolan B, PA-C  rosuvastatin  (CRESTOR ) 5 MG tablet Take 1 tablet (5 mg total) by mouth daily. 07/07/24   Kaur, Charanpreet, NP  sertraline  (ZOLOFT ) 50 MG tablet Take 1 tablet (50 mg total) by mouth daily. 07/03/24   Vincente Saber, NP  Spacer/Aero-Holding Raguel (AEROCHAMBER MV) inhaler Use as instructed 03/01/24   Van Knee, MD  SYMBICORT  80-4.5 MCG/ACT inhaler Inhale 2 puffs into the lungs 2 (two) times daily. 12/12/23  Isadora Hose, MD  vitamin B-12 (CYANOCOBALAMIN ) 1000 MCG tablet Take 1,000 mcg by mouth daily.    [provider]    Family History Family History  Problem Relation Age of Onset   Arthritis Mother    Other Mother        MVA   Hearing loss Father    Arthritis Father    COPD Father    Hypertension Sister    Hyperlipidemia Sister    Hearing loss Sister    Diabetes Sister    Hypertension Brother    Hearing loss Brother    Arthritis Brother    Hearing loss Brother    Arthritis Brother    Kidney disease Daughter    Asthma Daughter    Cancer Daughter        breast   Breast cancer Daughter    Asthma Grandchild     Social History Social History[1]   Allergies   Doxycycline , Fluticasone -salmeterol, Levaquin  [levofloxacin ], Amoxicillin -pot clavulanate, Diazepam, and Escitalopram oxalate   Review of Systems Review of Systems  Constitutional:  Positive for fatigue. Negative for chills, diaphoresis and fever.  HENT:  Positive for congestion, rhinorrhea and sinus pressure. Negative for ear pain, sinus pain and sore throat.   Respiratory:  Positive for cough and shortness of breath.   Cardiovascular:  Negative for chest pain.  Gastrointestinal:  Negative for abdominal pain, nausea and vomiting.   Musculoskeletal:  Positive for back pain. Negative for arthralgias and myalgias.  Skin:  Negative for rash.  Neurological:  Negative for weakness and headaches.  Hematological:  Negative for adenopathy.     Physical Exam Triage Vital Signs ED Triage Vitals  Encounter Vitals Group     BP      Girls Systolic BP Percentile      Girls Diastolic BP Percentile      Boys Systolic BP Percentile      Boys Diastolic BP Percentile      Pulse      Resp      Temp      Temp src      SpO2      Weight      Height      Head Circumference      Peak Flow      Pain Score      Pain Loc      Pain Education      Exclude from Growth Chart    No data found.  Updated Vital Signs BP 121/77 (BP Location: Right Arm)   Pulse 85   Temp 98.6 F (37 C) (Oral)   Resp 18   Ht 5' 4 (1.626 m)   Wt 146 lb 9.7 oz (66.5 kg)   SpO2 97%   BMI 25.16 kg/m       Physical Exam Vitals and nursing note reviewed.  Constitutional:      General: She is not in acute distress.    Appearance: Normal appearance. She is not ill-appearing or toxic-appearing.  HENT:     Head: Normocephalic and atraumatic.     Right Ear: External ear normal. There is impacted cerumen.     Left Ear: Tympanic membrane, ear canal and external ear normal.     Nose: Congestion present.     Mouth/Throat:     Mouth: Mucous membranes are moist.     Pharynx: Oropharynx is clear.  Eyes:     General: No scleral icterus.       Right eye: No discharge.  Left eye: No discharge.     Conjunctiva/sclera: Conjunctivae normal.  Cardiovascular:     Rate and Rhythm: Normal rate and regular rhythm.     Heart sounds: Normal heart sounds.  Pulmonary:     Effort: Pulmonary effort is normal. No respiratory distress.     Breath sounds: Normal breath sounds.  Musculoskeletal:     Cervical back: Neck supple.  Skin:    General: Skin is dry.  Neurological:     General: No focal deficit present.     Mental Status: She is alert. Mental  status is at baseline.     Motor: No weakness.     Gait: Gait normal.  Psychiatric:        Mood and Affect: Mood normal.        Behavior: Behavior normal.      UC Treatments / Results  Labs (all labs ordered are listed, but only abnormal results are displayed) Labs Reviewed - No data to display  EKG   Radiology DG Chest 2 View Result Date: 07/17/2024 CLINICAL DATA:  Cough, congestion, shortness of breath EXAM: CHEST - 2 VIEW COMPARISON:  Chest radiograph dated 03/01/2024 FINDINGS: Normal lung volumes. Patchy lower lobe opacities. 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 No pleural effusion or pneumothorax. Prominent right mediastinal contour may correspond to the ascending thoracic aorta. No acute osseous abnormality. IMPRESSION: 1. Patchy lower lobe opacities, which may represent atelectasis, aspiration, or pneumonia. 2. Prominent right mediastinal contour may correspond to the ascending thoracic aorta. Electronically Signed   By: Limin  Xu M.D.   On: 07/17/2024 12:15    Procedures Procedures (including critical care time)  Medications Ordered in UC Medications - No data to display  Initial Impression / Assessment and Plan / UC Course  I have reviewed the triage vital signs and the nursing notes.  Pertinent labs & imaging results that were available during my care of the patient were reviewed by me and considered in my medical decision making (see chart for details).   84 year old female with history of asthma and COPD returns for productive cough and congestion.  Symptoms present for 9 days.  Seen here 1 week ago negative for COVID/flu.  Treated with cough medication and prednisone  without relief.  On evaluation she has impacted cerumen in the right EAC, nasal congestion.  Throat clear.  Chest clear.  Heart regular rate and rhythm.  Chest x-ray obtained.  Patchy lower lobe opacities which could represent pneumonia.  Reviewed results with  patient.  Will treat at this time for community-acquired pneumonia with azithromycin , cefdinir  and also refilled benzonatate  and Promethazine  DM.  Encouraged increasing rest and fluids.  Advised to continue with inhalers at home as needed.  Advised to seek immediate reevaluation if fever or worsening breathing.  Advised repeat imaging in 3 to 4 weeks.   Final Clinical Impressions(s) / UC Diagnoses   Final diagnoses:  Acute cough  History of COPD  Pneumonia due to infectious organism, unspecified laterality, unspecified part of lung  Shortness of breath     Discharge Instructions      -Chest xray does show pneumonia -I sent antibiotics and cough medicine to pharmacy. -Refilled your cough medicine -Use inhalers at home as needed -If fever or worsening breathing please return or go to ER -Repeat x-ray in a month     ED Prescriptions     Medication Sig Dispense Auth. Provider   promethazine -dextromethorphan (PROMETHAZINE -DM) 6.25-15 MG/5ML syrup Take 5 mLs by mouth 4 (  four) times daily as needed for cough. 118 mL Arvis Huxley B, PA-C   azithromycin  (ZITHROMAX ) 250 MG tablet Take 1 tablet (250 mg total) by mouth daily. Take first 2 tablets together, then 1 every day until finished. 6 tablet Arvis Huxley B, PA-C   cefdinir  (OMNICEF ) 300 MG capsule Take 1 capsule (300 mg total) by mouth 2 (two) times daily for 7 days. 14 capsule Arvis Huxley B, PA-C   benzonatate  (TESSALON ) 200 MG capsule Take 1 capsule (200 mg total) by mouth 3 (three) times daily as needed. 30 capsule Arvis Huxley NOVAK, PA-C      PDMP not reviewed this encounter.     [1]  Social History Tobacco Use   Smoking status: Former    Current packs/day: 0.00    Average packs/day: 0.5 packs/day for 30.0 years (15.0 ttl pk-yrs)    Types: Cigarettes    Start date: 02/25/1979    Quit date: 02/24/2009    Years since quitting: 15.4   Smokeless tobacco: Never  Vaping Use   Vaping status: Never Used  Substance Use  Topics   Alcohol use: No   Drug use: No     Arvis Huxley NOVAK, PA-C 07/17/24 1224  "

## 2024-07-17 NOTE — Telephone Encounter (Signed)
 noted

## 2024-07-24 ENCOUNTER — Encounter

## 2024-07-24 ENCOUNTER — Other Ambulatory Visit

## 2024-07-31 ENCOUNTER — Encounter

## 2024-07-31 ENCOUNTER — Other Ambulatory Visit

## 2024-08-07 ENCOUNTER — Encounter

## 2024-08-07 ENCOUNTER — Other Ambulatory Visit

## 2024-08-07 ENCOUNTER — Ambulatory Visit: Admitting: Nurse Practitioner
